# Patient Record
Sex: Female | Born: 1965 | Race: Black or African American | Hispanic: Yes | Marital: Married | Smoking: Never smoker
Health system: Southern US, Community
[De-identification: ages and names within clinical notes are randomized; demographics above are authoritative.]

## PROBLEM LIST (undated history)

## (undated) DIAGNOSIS — I509 Heart failure, unspecified: Secondary | ICD-10-CM

## (undated) DIAGNOSIS — Z800 Family history of malignant neoplasm of digestive organs: Secondary | ICD-10-CM

## (undated) DIAGNOSIS — E669 Obesity, unspecified: Secondary | ICD-10-CM

## (undated) DIAGNOSIS — M545 Low back pain, unspecified: Secondary | ICD-10-CM

## (undated) DIAGNOSIS — M7989 Other specified soft tissue disorders: Secondary | ICD-10-CM

## (undated) DIAGNOSIS — R112 Nausea with vomiting, unspecified: Secondary | ICD-10-CM

## (undated) DIAGNOSIS — K5900 Constipation, unspecified: Secondary | ICD-10-CM

## (undated) DIAGNOSIS — M2550 Pain in unspecified joint: Secondary | ICD-10-CM

## (undated) DIAGNOSIS — I10 Essential (primary) hypertension: Secondary | ICD-10-CM

## (undated) DIAGNOSIS — E559 Vitamin D deficiency, unspecified: Secondary | ICD-10-CM

## (undated) DIAGNOSIS — Z9889 Other specified postprocedural states: Secondary | ICD-10-CM

## (undated) DIAGNOSIS — I429 Cardiomyopathy, unspecified: Secondary | ICD-10-CM

## (undated) DIAGNOSIS — G8929 Other chronic pain: Secondary | ICD-10-CM

## (undated) DIAGNOSIS — IMO0002 Reserved for concepts with insufficient information to code with codable children: Secondary | ICD-10-CM

## (undated) DIAGNOSIS — Z803 Family history of malignant neoplasm of breast: Secondary | ICD-10-CM

## (undated) DIAGNOSIS — Z8489 Family history of other specified conditions: Secondary | ICD-10-CM

## (undated) DIAGNOSIS — D649 Anemia, unspecified: Secondary | ICD-10-CM

## (undated) HISTORY — DX: Pain in unspecified joint: M25.50

## (undated) HISTORY — DX: Other chronic pain: G89.29

## (undated) HISTORY — DX: Family history of malignant neoplasm of breast: Z80.3

## (undated) HISTORY — DX: Low back pain, unspecified: M54.50

## (undated) HISTORY — PX: COLONOSCOPY: SHX174

## (undated) HISTORY — PX: LAPAROSCOPIC OVARIAN CYSTECTOMY: SUR786

## (undated) HISTORY — PX: BREAST LUMPECTOMY: SHX2

## (undated) HISTORY — DX: Other specified soft tissue disorders: M79.89

## (undated) HISTORY — DX: Essential (primary) hypertension: I10

## (undated) HISTORY — DX: Low back pain: M54.5

## (undated) HISTORY — PX: ABDOMINAL HYSTERECTOMY: SHX81

## (undated) HISTORY — DX: Reserved for concepts with insufficient information to code with codable children: IMO0002

## (undated) HISTORY — DX: Vitamin D deficiency, unspecified: E55.9

## (undated) HISTORY — PX: NASAL SINUS SURGERY: SHX719

## (undated) HISTORY — DX: Family history of malignant neoplasm of digestive organs: Z80.0

## (undated) HISTORY — PX: WISDOM TOOTH EXTRACTION: SHX21

---

## 2006-04-05 ENCOUNTER — Emergency Department (HOSPITAL_COMMUNITY): Admission: EM | Admit: 2006-04-05 | Discharge: 2006-04-05 | Payer: Self-pay | Admitting: Emergency Medicine

## 2006-08-08 ENCOUNTER — Emergency Department (HOSPITAL_COMMUNITY): Admission: EM | Admit: 2006-08-08 | Discharge: 2006-08-08 | Payer: Self-pay | Admitting: *Deleted

## 2009-04-20 DIAGNOSIS — M7989 Other specified soft tissue disorders: Secondary | ICD-10-CM | POA: Insufficient documentation

## 2009-06-06 ENCOUNTER — Emergency Department (HOSPITAL_COMMUNITY): Admission: EM | Admit: 2009-06-06 | Discharge: 2009-06-06 | Payer: Self-pay | Admitting: Emergency Medicine

## 2009-06-11 DIAGNOSIS — M5412 Radiculopathy, cervical region: Secondary | ICD-10-CM | POA: Insufficient documentation

## 2009-06-11 DIAGNOSIS — M542 Cervicalgia: Secondary | ICD-10-CM | POA: Insufficient documentation

## 2009-06-11 DIAGNOSIS — IMO0002 Reserved for concepts with insufficient information to code with codable children: Secondary | ICD-10-CM | POA: Insufficient documentation

## 2009-07-16 ENCOUNTER — Encounter: Admission: RE | Admit: 2009-07-16 | Discharge: 2009-07-16 | Payer: Self-pay | Admitting: Emergency Medicine

## 2009-07-26 DIAGNOSIS — G4700 Insomnia, unspecified: Secondary | ICD-10-CM | POA: Insufficient documentation

## 2009-10-09 ENCOUNTER — Encounter: Admission: RE | Admit: 2009-10-09 | Discharge: 2009-10-09 | Payer: Self-pay | Admitting: Neurosurgery

## 2010-06-06 ENCOUNTER — Encounter
Admission: RE | Admit: 2010-06-06 | Discharge: 2010-06-06 | Payer: Self-pay | Source: Home / Self Care | Attending: Orthopedic Surgery | Admitting: Orthopedic Surgery

## 2010-12-10 ENCOUNTER — Encounter: Payer: Self-pay | Admitting: Vascular Surgery

## 2010-12-17 ENCOUNTER — Encounter (INDEPENDENT_AMBULATORY_CARE_PROVIDER_SITE_OTHER): Payer: Worker's Compensation | Admitting: Vascular Surgery

## 2010-12-17 ENCOUNTER — Other Ambulatory Visit (HOSPITAL_COMMUNITY): Payer: Self-pay | Admitting: Orthopedic Surgery

## 2010-12-17 ENCOUNTER — Encounter (HOSPITAL_COMMUNITY)
Admission: RE | Admit: 2010-12-17 | Discharge: 2010-12-17 | Disposition: A | Discharge status: Home / Self Care | Source: Ambulatory Visit | Attending: Orthopedic Surgery | Admitting: Orthopedic Surgery

## 2010-12-17 DIAGNOSIS — IMO0002 Reserved for concepts with insufficient information to code with codable children: Secondary | ICD-10-CM

## 2010-12-17 DIAGNOSIS — M545 Low back pain, unspecified: Secondary | ICD-10-CM

## 2010-12-17 LAB — CBC
Hemoglobin: 10.5 g/dL — ABNORMAL LOW (ref 12.0–15.0)
MCH: 27.6 pg (ref 26.0–34.0)
MCHC: 32.3 g/dL (ref 30.0–36.0)
Platelets: 275 10*3/uL (ref 150–400)
RDW: 13.5 % (ref 11.5–15.5)

## 2010-12-17 LAB — COMPREHENSIVE METABOLIC PANEL
ALT: 12 U/L (ref 0–35)
Alkaline Phosphatase: 51 U/L (ref 39–117)
CO2: 29 mEq/L (ref 19–32)
Calcium: 9 mg/dL (ref 8.4–10.5)
GFR calc Af Amer: >60 mL/min (ref >60)
GFR calc non Af Amer: >60 mL/min (ref >60)
Glucose, Bld: 87 mg/dL (ref 70–99)
Sodium: 139 mEq/L (ref 135–145)

## 2010-12-17 LAB — URINALYSIS, ROUTINE W REFLEX MICROSCOPIC
Bilirubin Urine: NEGATIVE
Hgb urine dipstick: NEGATIVE
Ketones, ur: NEGATIVE mg/dL
Protein, ur: NEGATIVE mg/dL
Urobilinogen, UA: 0.2 mg/dL (ref 0.0–1.0)

## 2010-12-17 LAB — DIFFERENTIAL
Basophils Relative: 0 % (ref 0–1)
Eosinophils Absolute: 0.1 10*3/uL (ref 0.0–0.7)
Eosinophils Relative: 1 % (ref 0–5)
Monocytes Absolute: 0.6 10*3/uL (ref 0.1–1.0)
Monocytes Relative: 8 % (ref 3–12)

## 2010-12-17 LAB — APTT: aPTT: 28 seconds (ref 24–37)

## 2010-12-17 NOTE — Consult Note (Signed)
NEW PATIENT CONSULTATION  Molly Norton, Molly Norton DOB:  May 03, 1966                                       12/17/2010 GNFAO#:[id number]  Patient presents today for evaluation and preoperative planning for anterior lumbar interbody fusion exposure.  She is a very pleasant 45- year-old female with progressively severe degenerative disk disease at L4-5 and L5-S1.  She has been evaluated and treated by Dr. Estill Bamberg and is recommended anterior approach.  She does have severe lower back pain and also some pain extending into her right buttock and her lateral knee.  She has not had a prior back surgery.  PAST MEDICAL HISTORY:  Completely benign with no major medical difficulties.  PAST SURGICAL HISTORY:  She has had a cesarean section in 1990, ovarian cyst rupture with laparoscopic surgery to follow that, and a possible hysterectomy for menorrhagia in 2009.  These were all through a midline incision.  She did have some blistering from Steri-Strips following 1 of these procedures.  FAMILY HISTORY:  Negative for premature atherosclerotic disease.  SOCIAL HISTORY:  She is single.  She has 1 child.  She works as a Doctor, hospital.  She does not smoke or drink alcohol.  REVIEW OF SYSTEMS:  Height is 5 feet 8 inches tall.  She is 203 pounds. She does report a 40-pound weight gain since her diminished activity due to her back pain.  Review of systems is otherwise completely negative aside from some lower extremity swelling with prolonged standing.  PHYSICAL EXAMINATION:  A well-developed and well-nourished black female appearing stated age in no acute stress.  Blood pressure is 136/91, pulse 99, respirations 18, oxygen saturation is 99% on room air.  HEENT: Normal.  Her radial and dorsalis pedis pulses are 2+ bilaterally. Abdomen:  Soft, nontender.  She does have a well-healed midline incision.  No evidence of hernia.  Musculoskeletal shows no major deformity or cyanosis.   Neurologic:  No focal weakness or paresthesias. Skin without ulcers or rashes.  I discussed at length my role in exposure for ALIF to include mobilization of the peritoneal sac and intraperitoneal contents, mobilization of the left ureter and the iliac arteries and veins.  I did discuss the potential injury of these.  She does report that she is given 2 units of packed cells for need if she should have transfusion requirements.  I did explain that we would use Cell Saver during the time of surgery.  I explained the potential risk of this mobilization. She understands and wishes to proceed with surgery as scheduled on 07/12 at [facility].    Larina Earthly, M.D. Electronically Signed  TFE/MEDQ  D:  12/17/2010  T:  12/17/2010  Job:  [id number]  cc:   Estill Bamberg, MD

## 2010-12-19 ENCOUNTER — Inpatient Hospital Stay (HOSPITAL_COMMUNITY)

## 2010-12-19 ENCOUNTER — Inpatient Hospital Stay (HOSPITAL_COMMUNITY)
Admission: RE | Admit: 2010-12-19 | Discharge: 2011-01-02 | DRG: 455 | Disposition: A | Discharge status: Rehabilitation Facility | Source: Ambulatory Visit | Attending: Orthopedic Surgery | Admitting: Orthopedic Surgery

## 2010-12-19 DIAGNOSIS — M5137 Other intervertebral disc degeneration, lumbosacral region: Secondary | ICD-10-CM

## 2010-12-19 DIAGNOSIS — D649 Anemia, unspecified: Secondary | ICD-10-CM | POA: Diagnosis present

## 2010-12-19 DIAGNOSIS — M51379 Other intervertebral disc degeneration, lumbosacral region without mention of lumbar back pain or lower extremity pain: Secondary | ICD-10-CM

## 2010-12-19 DIAGNOSIS — M5126 Other intervertebral disc displacement, lumbar region: Secondary | ICD-10-CM | POA: Diagnosis present

## 2010-12-19 DIAGNOSIS — N393 Stress incontinence (female) (male): Secondary | ICD-10-CM | POA: Diagnosis not present

## 2010-12-19 DIAGNOSIS — Z01812 Encounter for preprocedural laboratory examination: Secondary | ICD-10-CM

## 2010-12-19 DIAGNOSIS — Z01810 Encounter for preprocedural cardiovascular examination: Secondary | ICD-10-CM

## 2010-12-19 HISTORY — PX: ANTERIOR LUMBAR FUSION: SHX1170

## 2010-12-21 NOTE — Op Note (Signed)
Molly Norton, Molly Norton           ACCOUNT NO.:  [id number]  MEDICAL RECORD NO.:  [id number]  LOCATION:  5032                         FACILITY:  [facility]  PHYSICIAN:  Estill Bamberg, MD      DATE OF BIRTH:  04/10/1966  DATE OF PROCEDURE:  12/19/2010 DATE OF DISCHARGE:                              OPERATIVE REPORT   PREOPERATIVE DIAGNOSES: 1. L4-5 and L5-S1 degenerative disk disease. 2. Bilateral L5-S1 neural foraminal stenosis.  POSTOPERATIVE DIAGNOSES: 1. L4-5 and L5-S1 degenerative disk disease. 2. Bilateral L5-S1 neural foraminal stenosis.  PROCEDURE: 1. Anterior lumbar interbody fusion L4-5 and L5-S1. 2. Posterior L5-S1 laminectomy, partial facetectomy, and foraminotomy     bilaterally. 3. Posterior spinal fusion, L4-5 and L5-S1. 4. Placement of posterior segmental instrumentation L4, L5, and S1. 5. Placement of intervertebral device L4-5 and L5-S1. 6. Use of local autograft. 7. Intraoperative bone marrow aspiration from the patient's left iliac     crest. 8. Intraoperative use of fluoroscopy.  SURGEON:  Estill Bamberg, MD  ASSISTANT:  Janace Litten, OPA for the posterior aspect of the procedure.  ESTIMATED BLOOD LOSS:  200 mL.  COMPLICATIONS:  None.  DISPOSITION:  Stable.  INDICATIONS FOR PROCEDURE:  Briefly, Molly Norton is a very pleasant 45-year-old female who I initially saw on May 07, 2010, with right leg in addition to low back pain.  This was following a work-related injury that occurred on June 05, 2009, when she slid on black ice and fell on her buttock and back.  We did go forward with extensive nonoperative measures and the patient did go onto have ongoing pain in her back in addition to her right greater than her left leg.  Of note, she did fail physical therapy in addition, epidural injections, anti-inflammatories, and pain medications.  I did review an MRI which was consistent with degenerative disk disease at the L5-S1 level and a  diskogram was also obtained which did reveal concordant pain at the L4-5 and L5-S1 levels. Given her failure of nonoperative measures, we did make a decision to go forward with anterior lumbar interbody fusion with instrumented posterior lumbar fusion in addition to decompression at the L5-S1 level. The patient fully understood the risks and limitations of the procedure as outlined in my preoperative note.  OPERATIVE DETAILS:  On December 19, 2010, the patient was brought to surgery and general endotracheal anesthesia was administered.  The patient was placed supine on hospital bed.  The abdomen was prepped and draped in the usual sterile fashion.  Time-out procedure was performed.  SCDs were placed and antibiotics were given.  A left paramedian incision was made by Dr. Tawanna Cooler Early and anterior approach was performed by Dr. Gretta Began. Ultimately, the L5-S1 interspace was identified.  I did use bipolar electrocautery to outline the anterior aspect of the intervertebral space.  I did use a 10-blade knife to perform an annulotomy anteriorly. I then went forward using a series of curettes and pituitaries to perform a very thorough diskectomy to the posterior annulus and to the annulus on the left and right sides.  I was very happy with the diskectomy.  The endplate was then prepared using a series of curettes in addition to  an angled awl.  I then placed a 14-mm 5-degree medium Couger trial and I did feel this did result in an excellent press fit. I did use live lateral fluoroscopy in order to confirm the appropriate sizing and the location of the graft.  At this point, I obtained a total of 12 mL of bone marrow aspirate from the patient's left iliac crest using a separate incision.  This was mixed with a total of 15 mL of Vitoss BA.  Approximately 5 mL of the Vitoss BA was packed into the 14- mm, 5-degree medium PEEK intervertebral cage.  This was tamped into position in the usual fashion and I  did note an excellent press fit. The L4-5 interspace was then approached and a diskectomy was performed as previously described.  The same size graft was tamped into position using the Vitoss BA bone marrow aspirate mixture.  I was very happy with the final press fit of each intervertebral graft and I was very happy with the appearance of the lateral and AP fluoroscopic views.  The wound was then copiously irrigated.  The fascia was closed using #1 Vicryl. The subcutaneous layer was closed using 2-0 Vicryl and the skin was closed using 3-0 Monocryl.  The patient was then turned prone in rotatory fashion.  The back was then prepped and draped in usual sterile fashion.  I then again brought in lateral fluoroscopy and I did place two 18-gauge needles over the midline in order to help optimize my location of my incision.  An incision was then made from the spinous process of L5 to the spinous process of S1.  The L5-S1 interspace was readily identified and a standard decompression was performed.  I did remove the ligamentum flavum in its entirety and did perform a thorough partial facetectomy and foraminotomy and I was easily able pass a Lorette Ang out the neural foramen on both the right and left sides at the termination of this portion of the procedure.  All epidural bleeding was meticulously controlled using FloSeal.  This wound was then copiously irrigated and closed with #1 Vicryl, followed by 2-0 Vicryl and 3-0 Monocryl.  I then marked out the lateral border of the pedicles of L4, L5, and S1 and paramedian incisions were made approximately 2.5 cm lateral to the lateral border of the pedicles.  The fascia was incised in line with the incision.  I then used Jamshidi needles and I placed guidewires down the L4, L5, and S1 pedicles on both the right and left sides.  The pedicles were then cannulated using a 6.5-mm tap.  I did use motor-evoked EMG in order to confirm that there was no  cortical violation or undue compression on any of the traversing or exiting nerves, when each of the taps were placed.  There is no tap that tested less than 15 mA.  I then proceeded with placing 7 x 45 mm screws at L4 and L5 and 7.5 x 14 mm screws at S1.  A 65-mm rod was selected and this was passed into the screw heads subfascially in a minimally invasive fashion.  Caps were placed along the L5 screws and final locking procedure was performed.  I then applied compression across the L4-5 and L5-S1 disks and a final locking maneuver was placed as caps were placed into the L4 and S1 screws.  This was done under AP and lateral fluoroscopy.  I was very happy with the final appearance of the radiographs.  At this point, I  turned my attention towards the fusion. I did use a series of curettes in order to decorticate the transverse process of L4, L5 and the sacral ala of S1 on the right side.  Once this was done, the remainder of the Vitoss BA was packed into the right posterolateral gutter.  Again, I was very pleased with the final construct.  The fascia was then closed using #1 Vicryl.  The subcutaneous layer was closed using 2-0 Vicryl and the skin was closed using 3-0 Monocryl.  Dermabond was then applied to the posterior incisions.  The patient was then rolled supine and transferred to recovery in stable condition.  Of note, autograft that was obtained from the decompression aspect of the procedure was also was placed into the right posterolateral gutter along with the Vitoss BA.  All instrument counts were correct at the termination of the procedure.  Of note, Janace Litten was my assistant throughout the posterior aspect of the procedure including the decompression, placement of the hardware, and the posterior fusion.     Estill Bamberg, MD     MD/MEDQ  D:  12/19/2010  T:  12/20/2010  Job:  [id number]  Electronically Signed by Estill Bamberg  on 12/21/2010 04:16:15 PM

## 2010-12-23 DIAGNOSIS — M47817 Spondylosis without myelopathy or radiculopathy, lumbosacral region: Secondary | ICD-10-CM

## 2010-12-23 DIAGNOSIS — IMO0002 Reserved for concepts with insufficient information to code with codable children: Secondary | ICD-10-CM

## 2010-12-24 LAB — URINALYSIS, MICROSCOPIC ONLY
Bilirubin Urine: NEGATIVE
Glucose, UA: NEGATIVE mg/dL
Ketones, ur: NEGATIVE mg/dL
Leukocytes, UA: NEGATIVE
Protein, ur: NEGATIVE mg/dL

## 2010-12-25 LAB — URINE CULTURE
Colony Count: NO GROWTH
Culture: NO GROWTH

## 2010-12-26 NOTE — Op Note (Signed)
  NAMEEARLENA, Molly Norton           ACCOUNT NO.:  [id number]  MEDICAL RECORD NO.:  [id number]  LOCATION:  5032                         FACILITY:  [facility]  PHYSICIAN:  Larina Earthly, M.D.    DATE OF BIRTH:  1965-09-27  DATE OF PROCEDURE:  12/19/2010 DATE OF DISCHARGE:                              OPERATIVE REPORT   PREOPERATIVE DIAGNOSIS:  Severe degenerative disk disease.  POSTOPERATIVE DIAGNOSIS:  Severe degenerative disk disease.  PROCEDURE:  Anterior exposure for lumbar interbody fusion L4-5 and L5- S1, which will be dictated as separate note by Dr. Estill Bamberg.  SURGEONS FOR THE EXPOSURE:  Larina Earthly, MD; Estill Bamberg, MD  ANESTHESIA:  General endotracheal.  COMPLICATIONS:  None.  INDICATIONS FOR PROCEDURE:  The patient is a 45 year old black female with progressively severe degenerative disk disease and pain.  She was seen preoperatively by Dr. Estill Bamberg and underwent the evaluation. It was felt that the best treatment option would be L4-5 and L5-S1 anterior approach for interbody fusion.  I saw the patient preoperatively in the office, discussed my role for exposure including potential injury to the intra-abdominal contents, ureter, and iliac vessels.  She has had multiple prior midline low midline incisions, we felt that she was acceptable candidate for anterior exposure.  PROCEDURE IN DETAIL:  The patient was taken to the operating room, placed in supine position where the abdomen was prepped and draped in usual sterile fashion.  Lateral C-arm ejection projection revealed the level of L4-5 and L5-S1 disk.  A paramedian incision was made over this level and carried down through the subcutaneous fat to the level of anterior rectus sheath.  The anterior rectus sheath was opened in line with the skin incision.  Rectus muscles were mobilized and the retroperitoneum was entered bluntly below the level of the semilunar line on the lateral aspect of the left lower  quadrant.  Blunt dissection was used to mobilize the peritoneal sac.  The peritoneum was not entered.  The posterior rectus sheath was opened laterally to give continued mobilization of intraperitoneal contents.  The ureter was identified and protected and the L5-S1 disk was identified between the level of the iliac vessels.  The patient's middle sacral vein was ligated, occluded with Ligaclips, and divided for better mobilization. Next, the left iliac vessels were mobilized towards the right.  The iliolumbar vein was identified and was ligated with 2-0 silk ties and divided.  This gave good mobilization for exposure of the L4-5 disk. The brow retractor was brought onto the field and the reverse 150 blades were positioned to the right and left of the L5-S1 disk.  Dr. Yevette Edwards then did the interbody fusion that would be dictated in a separate note.  I then re-scrubbed and repositioned the brow retractor to the L4-5 disk space and again the fusion will be dictated as a separate note by Dr. Estill Bamberg.     Larina Earthly, M.D.TFE/MEDQ  D:  12/20/2010  T:  12/21/2010  Job:  [id number]  Electronically Signed by Melysa Schroyer M.D. on 12/26/2010 07:47:50 AM

## 2010-12-27 LAB — URINALYSIS, ROUTINE W REFLEX MICROSCOPIC
Bilirubin Urine: NEGATIVE
Ketones, ur: NEGATIVE mg/dL
Nitrite: POSITIVE — AB
Protein, ur: NEGATIVE mg/dL
Specific Gravity, Urine: 1.013 (ref 1.005–1.030)
Urobilinogen, UA: 1 mg/dL (ref 0.0–1.0)

## 2010-12-27 LAB — URINE MICROSCOPIC-ADD ON

## 2010-12-28 NOTE — Consult Note (Signed)
Molly Norton, Molly Norton NO.:  [id number]  MEDICAL RECORD NO.:  [id number]  LOCATION:  5032                         FACILITY:  [facility]  PHYSICIAN:  Valetta Fuller, MD    DATE OF BIRTH:  1965-10-13  DATE OF CONSULTATION:  12/24/2010 DATE OF DISCHARGE:                                CONSULTATION   REASON FOR CONSULTATION:  Urinary incontinence, 5-day status post anterior lumbar interbody fusion L4-5, L5-S1.  HISTORY OF PRESENT ILLNESS:  This is a 45 year old female who is status post anterior interbody fusion 5 days ago after failing conservative measures for a work related injury and a fall on ice in 2011.  Her surgery was unremarkable.  The patient states prior to her surgery she had no history of urinary incontinence.  She states that she would feel appropriate urge to urinate and felt completely like she was completely emptying her bladder.  She states she drinks large volumes of water daily, therefore, voids approximately 15-20 times during the day and possibly 2-3 times at night.  She does not complain of any urinary urgency, hesitancy, dysuria, urgency or hematuria.  She was able to tolerate her indwelling Foley catheter until approximately 3 days ago.  She was able to void, although unable to hold her urine once her urge was strong after removal of catheter.  She states she was slow to progress to the bedside commode.  Over the past 24 hours, her urge to urinate has become less frequent and she has become completely incontinent of urine.  She is unable to feel that her bladder is full or empty.  She denies any complaints of nausea, vomiting, fever, chills, diarrhea, constipation, chest pain or shortness of breath.  She does complain of low back pain and bilateral lower extremity weakness secondary to her surgical procedure.  PAST MEDICAL HISTORY:  Insignificant.  PAST SURGICAL HISTORY: 1. Status post anterior lumbar interbody fusion L4-5, L5-S1 2.  Abdominal hysterectomy in 2010.  ALLERGIES: 1. LATEX. 2. ADHESIVE. 3. RELAFEN. 4. LISINOPRIL.  MEDICATIONS: 1. Flexeril. 2. Colace. 3. Robaxin. 4. Oxybutynin. 5. Oxycodone. 6. Ultram. 7. Vitamin E. 8. Vitamin B. 9. Acetaminophen p.r.n. 10.Hydroxyzine p.r.n. 11.Valium p.r.n. 12.Ambien p.r.n.  FAMILY HISTORY:  Noncontributory.  SOCIAL HISTORY:  She lives in [place] with her husband.  She has a 53- year-old daughter.  She denies any alcohol or tobacco use.  REVIEW OF SYSTEMS:  As stated per HPI.  PHYSICAL EXAMINATION:  VITAL SIGNS:  Temperature 98.6, pulse 114, respirations 18, blood pressure 139/80. CONSTITUTIONAL:  She is a well-developed obese black female in no acute distress, smiling and responding appropriately to questions. HEENT:  Normocephalic, atraumatic.  Oropharynx clear. ABDOMEN:  Soft, round, nontender.  Well-healed abdominal incision. GU:  Meatus is without lesion or mass. GYN:  No cystocele or bladder prolapse.  Labia without lesion or mass. NEURO:  Remote and recent memory is intact.  LABORATORY DATA:  From December 17, 2010, sodium 139, potassium 4.6, chloride 103, CO2 29, BUN 7, creatinine 0.56, glucose 87 and hemoglobin is 10.5.  IMPRESSION/PLAN:  New onset of urinary incontinence.  We will check an urinalysis, culture and sensitivity.  We will also check bladder emptying for bladder overflow  incontinence versus bladder spasm. Further recommendations will be pending results of the above.  Dr. Isabel Caprice will see this young lady later this afternoon.     Delia Chimes, NP   ______________________________ Valetta Fuller, MD    MA/MEDQ  D:  12/24/2010  T:  12/25/2010  Job:  [id number]  Electronically Signed by Delia Chimes NP on 12/25/2010 02:27:42 PM Electronically Signed by Barron Alvine M.D. on 12/28/2010 04:33:33 PM

## 2010-12-29 LAB — URINE CULTURE
Colony Count: >=100000
Culture  Setup Time: 203802280020

## 2010-12-30 LAB — URINALYSIS, ROUTINE W REFLEX MICROSCOPIC
Bilirubin Urine: NEGATIVE
Glucose, UA: NEGATIVE mg/dL
Hgb urine dipstick: NEGATIVE
Ketones, ur: NEGATIVE mg/dL
Protein, ur: NEGATIVE mg/dL
Urobilinogen, UA: 0.2 mg/dL (ref 0.0–1.0)

## 2010-12-31 LAB — URINE CULTURE
Colony Count: NO GROWTH
Culture: NO GROWTH

## 2011-01-02 ENCOUNTER — Inpatient Hospital Stay (HOSPITAL_COMMUNITY)
Admission: RE | Admit: 2011-01-02 | Discharge: 2011-01-20 | DRG: 945 | Disposition: A | Discharge status: Home Health | Source: Other Acute Inpatient Hospital | Attending: Physical Medicine & Rehabilitation | Admitting: Physical Medicine & Rehabilitation

## 2011-01-02 DIAGNOSIS — M51379 Other intervertebral disc degeneration, lumbosacral region without mention of lumbar back pain or lower extremity pain: Secondary | ICD-10-CM | POA: Diagnosis present

## 2011-01-02 DIAGNOSIS — N319 Neuromuscular dysfunction of bladder, unspecified: Secondary | ICD-10-CM | POA: Diagnosis not present

## 2011-01-02 DIAGNOSIS — A498 Other bacterial infections of unspecified site: Secondary | ICD-10-CM | POA: Diagnosis present

## 2011-01-02 DIAGNOSIS — K5900 Constipation, unspecified: Secondary | ICD-10-CM | POA: Diagnosis present

## 2011-01-02 DIAGNOSIS — D649 Anemia, unspecified: Secondary | ICD-10-CM | POA: Diagnosis present

## 2011-01-02 DIAGNOSIS — R32 Unspecified urinary incontinence: Secondary | ICD-10-CM | POA: Diagnosis present

## 2011-01-02 DIAGNOSIS — IMO0002 Reserved for concepts with insufficient information to code with codable children: Secondary | ICD-10-CM

## 2011-01-02 DIAGNOSIS — M48061 Spinal stenosis, lumbar region without neurogenic claudication: Secondary | ICD-10-CM

## 2011-01-02 DIAGNOSIS — N390 Urinary tract infection, site not specified: Secondary | ICD-10-CM | POA: Diagnosis present

## 2011-01-02 DIAGNOSIS — R5381 Other malaise: Secondary | ICD-10-CM

## 2011-01-02 DIAGNOSIS — Z5189 Encounter for other specified aftercare: Principal | ICD-10-CM

## 2011-01-02 DIAGNOSIS — M5126 Other intervertebral disc displacement, lumbar region: Secondary | ICD-10-CM | POA: Diagnosis present

## 2011-01-02 DIAGNOSIS — F411 Generalized anxiety disorder: Secondary | ICD-10-CM | POA: Diagnosis present

## 2011-01-02 DIAGNOSIS — Z981 Arthrodesis status: Secondary | ICD-10-CM

## 2011-01-02 DIAGNOSIS — R259 Unspecified abnormal involuntary movements: Secondary | ICD-10-CM | POA: Diagnosis present

## 2011-01-02 DIAGNOSIS — T8189XA Other complications of procedures, not elsewhere classified, initial encounter: Secondary | ICD-10-CM | POA: Diagnosis not present

## 2011-01-02 DIAGNOSIS — M5137 Other intervertebral disc degeneration, lumbosacral region: Secondary | ICD-10-CM | POA: Diagnosis present

## 2011-01-03 DIAGNOSIS — R5381 Other malaise: Secondary | ICD-10-CM

## 2011-01-03 DIAGNOSIS — M48061 Spinal stenosis, lumbar region without neurogenic claudication: Secondary | ICD-10-CM

## 2011-01-03 DIAGNOSIS — IMO0002 Reserved for concepts with insufficient information to code with codable children: Secondary | ICD-10-CM

## 2011-01-03 LAB — DIFFERENTIAL
Basophils Absolute: 0 10*3/uL (ref 0.0–0.1)
Basophils Relative: 1 % (ref 0–1)
Lymphocytes Relative: 31 % (ref 12–46)
Monocytes Absolute: 0.6 10*3/uL (ref 0.1–1.0)
Neutro Abs: 4.3 10*3/uL (ref 1.7–7.7)

## 2011-01-03 LAB — CBC
HCT: 23.8 % — ABNORMAL LOW (ref 36.0–46.0)
Hemoglobin: 7.5 g/dL — ABNORMAL LOW (ref 12.0–15.0)
MCHC: 31.5 g/dL (ref 30.0–36.0)
RDW: 14 % (ref 11.5–15.5)
WBC: 7.7 10*3/uL (ref 4.0–10.5)

## 2011-01-03 LAB — COMPREHENSIVE METABOLIC PANEL
ALT: 19 U/L (ref 0–35)
Albumin: 2.9 g/dL — ABNORMAL LOW (ref 3.5–5.2)
Alkaline Phosphatase: 76 U/L (ref 39–117)
Chloride: 103 mEq/L (ref 96–112)
Glucose, Bld: 86 mg/dL (ref 70–99)
Potassium: 4.1 mEq/L (ref 3.5–5.1)
Sodium: 139 mEq/L (ref 135–145)
Total Bilirubin: 0.1 mg/dL — ABNORMAL LOW (ref 0.3–1.2)
Total Protein: 6.8 g/dL (ref 6.0–8.3)

## 2011-01-03 NOTE — H&P (Signed)
NAMELYNSEY, ANGE NO.:  [id number]  MEDICAL RECORD NO.:  [id number]  LOCATION:  4143                         FACILITY:  [facility]  PHYSICIAN:  Erick Colace, M.D.DATE OF BIRTH:  30-Oct-1965  DATE OF ADMISSION:  01/02/2011 DATE OF DISCHARGE:                             HISTORY & PHYSICAL   REASON FOR ADMISSION:  Radiculopathy, lumbar stenosis status post L4-L5, L5-S1 360-degree anterior posterior fusion.  A 45 year old female with history of chronic low back pain.  She was injured at work in December 2010.  She underwent conservative care for over a year, but then eventually underwent an L4-L5, L5-S1 360-degree fusion by Dr. Yevette Edwards on December 19, 2010.  Postoperatively, she had issues with pain and anxiety.  She had difficulty mobilizing lower extremities.  She has had tachycardia during her therapy as well as at rest.  She has had urinary incontinence, but no elevation in PVR.  GU recommended working on constipation issues, Ditropan added for urge incontinence, and a followup was recommended if no improvement as an outpatient.  REVIEW OF SYSTEMS:  Positive for urinary incontinence, wounds both abdominal and back, peeling Dermabond.  NEUROLOGIC:  Positive weakness, positive numbness, right greater than left lower extremity; anxiety and heart palpitations.  PAST MEDICAL HISTORY: 1. Sinus surgery. 2. Hysterectomy. 3. Chronic anemia.  FAMILY HISTORY:  Noncontributory.  SOCIAL HISTORY:  Lives alone, one-level home.  No tobacco.  Negative EtOH.  Friends can check in as needed.  FUNCTIONAL HISTORY:  Independent prior to admission.  Home medications include MiraLax, hydrocodone, Ultram, Flexeril, Mobic, biotin, vitamin D, multivitamin, and vitamin E.  Last hemoglobin was 10.5 on December 17, 2010, last white count 7.7, platelets 275,000.  Sodium 139, potassium 4.6, BUN 7, creatinine 0.56. Last urinalysis and culture was no growth on December 30, 2010.  PHYSICAL EXAMINATION:  GENERAL:  Well-developed, well-nourished female in no acute distress.  Overweight female in no acute distress. VITAL SIGNS:  Blood pressure 106/64, pulse 101, respirations 20, temperature 98.4. HEENT:  Eyes; anicteric, noninjected.  External ENT normal. NECK:  Supple without adenopathy. RESPIRATORY:  Effort is good.  Lungs are clear. HEART:  Mild tachycardia, rate 104.  Regular rhythm. EXTREMITIES:  1+ pedal edema bilateral lower extremities. ABDOMEN:  Positive bowel sounds.  She has Dermabond over the left lower quadrant abdominal incision which is healing well.  No evidence of drainage. SKIN:  In the lumbar spine, she has 2 incisions which are healing well with Dermabond. NEUROLOGIC:  Her motor strength is 5/5 bilateral deltoid, biceps, triceps, grip in lower extremities; 3-/5 in hip flexor, knee extensor, and ankle dorsiflexor, plantar flexor.  Sensation is mildly reduced in the right foot compared to the left foot.  There is no evidence of clonus.  No evidence of increased tone. PSYCHIATRIC:  Mood and memory are intact.  Orientation x3.  Affect is flat.  POSTADMISSION PHYSICIAN EVALUATION: 1. Functional deficits secondary to lumbar radiculopathy status post     lumbar decompression and 360-degree fusion postoperative day #15. 2. The patient admitted to receive collaborative interdisciplinary     care between the physiatrist, rehab nursing staff, and therapy     team. 3. The patient's level of medical complexity  and substantial therapy     needs in context of that medical necessity cannot be provided at a     lesser intensity of care. 4. The patient experienced substantial functional loss from her     baseline upon functional assessment at the time of preadmission     screening.  The patient was at a +2 total assist level for     transfers.  Gait was not tested.  She was total assist lower body     dressing and bathing, min assist upper body ADLs.   Currently, the     patient is min-to-mod assist bed mobility, min guard transfers, min     assist 50 feet rolling walker, min assist upper body dressing, mod     to max lower body dressing, min assist grooming, min to max toilet     transfers.  Judging by the patient's diagnosis, physical exam, and     functional history, the patient had displayed the ability to make     functional progress which will result in measurable gains while in     inpatient rehab.  These gains will be of substantial and practical     use upon discharge to home in facilitating mobility and self-care     independence.  Interim change in medical status since preadmission     screening are detailed in the history of present illness. 5. Physiatrist will provide 24-hour management of medical needs as     well as oversight of the therapy plan/treatment and provide     guidance as appropriate regarding interactions of the two. 6. A 24-hour rehab nursing will assess in the management of skin     bowel, bladder, wounds and help integrate therapy concepts,     techniques, education. 7. PT will assess and treat for pre-gait training, gait training,     endurance, safety, maintaining back precautions, ring mobility.     Goals are for a modified independent level with all mobility. 8. OT will assess and treat for ADLs along with back precautions while     performing ADLs, safety, endurance, equipment.  Goals are for     modified independent level with upper body and lower body ADLs. 9. Case management and social work will assess and treat for     psychosocial issues and discharge planning. 10.Team conferences will be held weekly to assess the patient     progress/goals and to determine barriers to discharge. 11.The patient has demonstrated sufficient medical stability and     exercise capacity to tolerate at least 3 hours of therapy per day     at least 5 days per week. 12.Estimated length of stay is 10-14 days.  Prognosis  for further     functional improvement is good.  MEDICAL PROBLEM LESS AND PLAN: 1. Tachycardia.  Has a history of chronic anemia, may have had     superimposed acute blood loss anemia which is predisposing her.  We     will check CBC in the morning.  Also check her hydration and make     sure she does not have any dehydration. 2. Deep venous thrombosis prophylaxis with TED hose and SCDs and     increasing her mobility. 3. Urinary tract infection with Escherichia coli recultured while on     antibiotics which was negative, of course.  She is day 6-7 on     antibiotics.  We will reculture if she has symptoms or fever. 4. Urinary incontinence, urge  incontinence.  Continue Ditropan, may     need to adjust dosage. 5. Constipation.  Continue current bowel program which consists of     MiraLax.  Allow the patient to use her "Smooth Move Tea."  She does     have some chronic constipation issues even before she was using     narcotics. 6. Pain management.  Was using about 7.5 mg twice a day of hydrocodone     at home plus 1 Percocet at home.  She is requiring much more pain     medications postoperatively.  We will increase her OxyContin to 40     mg p.o. b.i.d., monitor for signs of cognitive deficits as well as     sedation. 7. Spasms.  She complains of severe spasms, although she does not have     overt spasticity.  She has had good success with baclofen and will     resume this. 8. Anxiety.  She is on Valium 5 mg q.4 h., may need to switch to SSRI     given this maybe longer-term issue.  Discussed Rehab Medicine Services with the patient, answered questions.  Motivation and mood appear to be adequate for full participation in rehab program.     Erick Colace, M.D.     AEK/MEDQ  D:  01/02/2011  T:  01/03/2011  Job:  [id number]  cc:   Estill Bamberg, MD Valetta Fuller, MD  Electronically Signed by Claudette Laws M.D. on 01/03/2011 11:22:34 AM

## 2011-01-04 LAB — TYPE AND SCREEN
ABO/RH(D): A POS
Unit division: 0

## 2011-01-05 LAB — CBC
HCT: 28.1 % — ABNORMAL LOW (ref 36.0–46.0)
MCH: 26.5 pg (ref 26.0–34.0)
MCHC: 32 g/dL (ref 30.0–36.0)
MCV: 82.6 fL (ref 78.0–100.0)
Platelets: 492 10*3/uL — ABNORMAL HIGH (ref 150–400)
RDW: 14.1 % (ref 11.5–15.5)

## 2011-01-06 DIAGNOSIS — M48061 Spinal stenosis, lumbar region without neurogenic claudication: Secondary | ICD-10-CM

## 2011-01-06 DIAGNOSIS — IMO0002 Reserved for concepts with insufficient information to code with codable children: Secondary | ICD-10-CM

## 2011-01-06 DIAGNOSIS — R5381 Other malaise: Secondary | ICD-10-CM

## 2011-01-06 LAB — CBC
MCH: 26.4 pg (ref 26.0–34.0)
MCHC: 31.8 g/dL (ref 30.0–36.0)
Platelets: 491 10*3/uL — ABNORMAL HIGH (ref 150–400)
RDW: 13.8 % (ref 11.5–15.5)

## 2011-01-07 DIAGNOSIS — R5381 Other malaise: Secondary | ICD-10-CM

## 2011-01-07 DIAGNOSIS — M48061 Spinal stenosis, lumbar region without neurogenic claudication: Secondary | ICD-10-CM

## 2011-01-07 DIAGNOSIS — IMO0002 Reserved for concepts with insufficient information to code with codable children: Secondary | ICD-10-CM

## 2011-01-08 DIAGNOSIS — F411 Generalized anxiety disorder: Secondary | ICD-10-CM

## 2011-01-09 DIAGNOSIS — M48061 Spinal stenosis, lumbar region without neurogenic claudication: Secondary | ICD-10-CM

## 2011-01-09 DIAGNOSIS — R5381 Other malaise: Secondary | ICD-10-CM

## 2011-01-09 DIAGNOSIS — IMO0002 Reserved for concepts with insufficient information to code with codable children: Secondary | ICD-10-CM

## 2011-01-10 DIAGNOSIS — M48061 Spinal stenosis, lumbar region without neurogenic claudication: Secondary | ICD-10-CM

## 2011-01-10 DIAGNOSIS — IMO0002 Reserved for concepts with insufficient information to code with codable children: Secondary | ICD-10-CM

## 2011-01-10 DIAGNOSIS — R5381 Other malaise: Secondary | ICD-10-CM

## 2011-01-10 LAB — BASIC METABOLIC PANEL
Calcium: 9.1 mg/dL (ref 8.4–10.5)
GFR calc Af Amer: >60 mL/min (ref >60)
GFR calc non Af Amer: >60 mL/min (ref >60)
Glucose, Bld: 84 mg/dL (ref 70–99)
Sodium: 139 mEq/L (ref 135–145)

## 2011-01-13 DIAGNOSIS — IMO0002 Reserved for concepts with insufficient information to code with codable children: Secondary | ICD-10-CM

## 2011-01-13 DIAGNOSIS — R5381 Other malaise: Secondary | ICD-10-CM

## 2011-01-13 DIAGNOSIS — M48061 Spinal stenosis, lumbar region without neurogenic claudication: Secondary | ICD-10-CM

## 2011-01-13 LAB — CBC
MCH: 26.4 pg (ref 26.0–34.0)
Platelets: 334 10*3/uL (ref 150–400)
RBC: 3.64 MIL/uL — ABNORMAL LOW (ref 3.87–5.11)
WBC: 4.8 10*3/uL (ref 4.0–10.5)

## 2011-01-14 DIAGNOSIS — M7989 Other specified soft tissue disorders: Secondary | ICD-10-CM

## 2011-01-14 LAB — BASIC METABOLIC PANEL
CO2: 28 mEq/L (ref 19–32)
Calcium: 9.1 mg/dL (ref 8.4–10.5)
Potassium: 3.7 mEq/L (ref 3.5–5.1)
Sodium: 141 mEq/L (ref 135–145)

## 2011-01-15 DIAGNOSIS — M48061 Spinal stenosis, lumbar region without neurogenic claudication: Secondary | ICD-10-CM

## 2011-01-15 DIAGNOSIS — IMO0002 Reserved for concepts with insufficient information to code with codable children: Secondary | ICD-10-CM

## 2011-01-15 DIAGNOSIS — R5381 Other malaise: Secondary | ICD-10-CM

## 2011-01-17 DIAGNOSIS — M48061 Spinal stenosis, lumbar region without neurogenic claudication: Secondary | ICD-10-CM

## 2011-01-17 DIAGNOSIS — IMO0002 Reserved for concepts with insufficient information to code with codable children: Secondary | ICD-10-CM

## 2011-01-17 DIAGNOSIS — R5381 Other malaise: Secondary | ICD-10-CM

## 2011-01-17 LAB — BASIC METABOLIC PANEL
Chloride: 99 mEq/L (ref 96–112)
GFR calc Af Amer: >60 mL/min (ref >60)
GFR calc non Af Amer: >60 mL/min (ref >60)
Glucose, Bld: 83 mg/dL (ref 70–99)
Potassium: 3.7 mEq/L (ref 3.5–5.1)
Sodium: 137 mEq/L (ref 135–145)

## 2011-01-17 LAB — VITAMIN D 1,25 DIHYDROXY
Vitamin D2 1, 25 (OH)2: <8 pg/mL
Vitamin D3 1, 25 (OH)2: 35 pg/mL

## 2011-01-19 ENCOUNTER — Inpatient Hospital Stay (HOSPITAL_COMMUNITY)

## 2011-02-05 NOTE — Discharge Summary (Signed)
Molly Norton, [name]           ACCOUNT NO.:  [id number]  MEDICAL RECORD NO.:  [id number]  LOCATION:  4039                         FACILITY:  [facility]  PHYSICIAN:  Erick Colace, M.D.DATE OF BIRTH:  [birth year]  DATE OF ADMISSION:  01/02/2011 DATE OF DISCHARGE:  01/20/2011                              DISCHARGE SUMMARY   DISCHARGE DIAGNOSES: 1. L4-L5, L5-S1 degenerative disc disease with L5-S1 stenosis and     radiculopathy requiring anterior interbody fusion and posterior     lumbar interbody fusion. 2. Escherichia coli urinary tract infection, treated. 3. Anxiety issues much improved. 4. Neurogenic bowel and bladder improving. 5. Peripheral edema. 6. Anemia improved past transfusion.  HISTORY OF PRESENT ILLNESS:  Molly Norton is a 45 year old female with history of chronic low back pain radiating to right lower extremity due to L4-L5 and L5-S1 DDD with bilateral L5-S1 foraminal stenosis.  The patient elected to undergo L4-S1 ALIF and posterior lumbar fusion L4-5, L5-S1 by Dr. Yevette Edwards on December 28, 2010.  Postop has had issues with pain as well as anxiety issues.  She has required extra time for mobility and noted to have difficulty mobilizing bilateral lower extremity.  She was also noted to have incontinence which is acute onset and GU was consulted for input.  A UA/UC was done and the patient was noted to have E. coli UTI and most recent culture of December 27, 2010, treated with Cipro.  The PVR checks were low and recommendations were to work on constipation which is a current problem.  Also, if symptoms do not improve, follow up on outpatient basis.  The patient's therapies are ongoing and the patient is noted to be deconditioned.  The patient was evaluated by rehab and we felt that she would benefit from a CIR program.  PAST MEDICAL HISTORY:  Significant for sinus surgery, hysterectomy and chronic anemia.  REVIEW OF SYMPTOMS:  Positive for urinary incontinence,  abdominal wounds of both left lower abdominal area as well as on back, positive numbness right greater than left lower extremity, anxiety as well as palpitations.  ALLERGIES:  To LISINOPRIL and NABUMETONE.  FAMILY HISTORY:  Noncontributory.  SOCIAL HISTORY:  The patient lives alone in 1-level home with 26 steps at entry.  Does not use any tobacco or alcohol.  Friends can check in as needed past discharge.  FUNCTIONAL HISTORY:  The patient was independent prior to admission.  FUNCTIONAL STATUS:  The patient is supervision to modified independent for bed mobility, min assist transfers, min guard assist ambulating 60 feet with rolling walker.  She is mod assist for lower body bathing, max assist for lower body dressing.  PHYSICAL EXAMINATION:  VITALS:  Blood pressure 106/64, pulse 101, respirations 20 and temperature 98.9. GENERAL:  The patient is a well-nourished, well-developed, overweight female, in no acute distress. HEENT:  Eyes anicteric, noninjected.  Oral mucosa is pink and moist. NECK:  Supple without JVD or lymphadenopathy. LUNGS:  Clear to auscultation bilaterally with effort. HEART:  Mild tachycardia, regular rhythm. EXTREMITIES:  Pedal edema 1+ bilaterally. ABDOMEN:  Soft and nontender with positive bowel sounds.  Left lower quadrant abdominal incision is healing well without any evidence of drainage. SKIN:  Lumbar spine.  The patient with 2 incisions with a healing well with the wound in place. NEUROLOGIC:  The patient is alert and oriented x3.  Affect is flat. Mood and memory are intact.  Motor strength is 5/5 bilateral deltoids, biceps, triceps and grips, lower extremity is 3- to 5 in hip flexions, knee extensions and ankle dorsiflexion, plantar flexion.  Sensation is mild decreased in right foot compared to left foot.  No evidence of clonus or increased tone.  HOSPITAL COURSE:  [name] was admitted to rehab on January 02, 2011, for inpatient  therapies to consist of PT, OT at least 3 hours 5 days a week.  Past admission physiatrist with rehab RN and therapy team have worked together to provide customized collaborative interdisciplinary care.  The patient had followup UA that was noted of December 30, 2010 that showed no growth.  She was maintained on Cipro through January 03, 2011, to complete 7 total days of antibiotic therapy. She was also started on aggressive bowel program to help with her constipation issues.  She was noted to have some issues with sedation and Valium was decreased to 5 mg p.o. q.i.d.  The patient was started on baclofen 10 mg p.o. b.i.d. to help with her spasticity.  Rehab RN has worked with the patient on bowel and bladder program.  Voiding, the patient was toileted q.4 h.  Bladder scans were checked showing volumes at 50-69 mL.  She continued to have issues with urge incontinence and Ditropan was increased to q.i.d. basis.  Labs were done past admission showing acute blood loss anemia with H and H at 7.5 and 23.8.  The patient reported that she had donated autologous blood and she was transfused with 2 units of this.  Recheck CBC of January 07, 2011, showed improvement with hemoglobin 9.6, hematocrit 30.2, white count 6.2 and platelets 491.  Lytes were checked past admission.  The patient was noted to develop increased edema in lower extremity and TEDs were used. She did require addition of low-dose diuretic to help with edema control.  Lytes were checked on January 17, 2011, and these revealed sodium 137, potassium 3.7, chloride 99, CO2 30, BUN 7, creatinine 0.6 and glucose 83.  The patient's back incision was noted to be healing well without any signs or symptoms of infection.  The patient was weaned off Valium during this stay.  Bilateral lower extremity Dopplers were done and showed no evidence of DVT.  The patient has continued to have complaints about pain.  However, she reports this is overall improved  by time of discharge.  During the patient's stay in rehab, weekly team conferences were held to monitor the patient's progress, set goals as well as discuss barriers to discharge.  At the time of admission, the patient was noted to be limited by acute low back as well as abdominal pain as well as spasms. She was noted to have bilateral lower extremity weakness with decrease in eccentric control, anxiety as well as fear of mobility resulting in very guarded posture with guarded movements and requiring increased time for all mobility.  The patient was max assist for bed mobility.  Max assist for stand pivot transfers with increased time.  She was able to ambulate 35+ 45 feet with min assist with focus on bilateral heel strike as well as right foot clearance.  She was noted to have excessive sedation initially and her OxyContin dose was adjusted to help decrease sedative symptoms.  Currently, the patient is showing  increased activity tolerance, increased base of support and improved problem solving.  She is ambulating short distance with close supervision.  She continues to have difficulty advancing right lower extremity.  Family education was done with friend regarding assistance needed for all mobility and self- care and 24 hours assistance will be provided past discharge.  OT has worked with the patient on self-care tasks.  At admission, the patient was total assist for lower body ADLs and required assistance for all mobility.  She has made significant progress and is currently at supervision level for bathing and dressing.  She is modified independent for grooming.  Supervision with all ideal task at rolling walker level. Further followup home health PT to continue via [facility].  On January 20, 2011, the patient is discharged to home.  DISCHARGE MEDICATIONS: 1. Baclofen 10 mg p.o. b.i.d. 2. Flexeril 10 mg p.o. nightly p.r.n. spasms 3. Fleet enema one per rectum daily  p.r.n. 4. Lasix 20 mg p.o. per day. 5. Robaxin 500 mg p.o. q.6 h. p.r.n. spasms. 6. Ditropan 5 mg p.o. q.i.d. 7. OxyContin tapered 30 mg p.o. q.a.m. and 20 mg p.o. nightly x10     days, then decreased to 20 mg p.o. b.i.d. till gone.  The patient     has prescriptions for OxyContin 30 mg #10 and 20 mg #40. 8. OxyIR 5 mg 1-2 p.o. q.6 h. p.r.n. moderate-to-severe pain #60     prescription. 9. Senna S 2 p.o. b.i.d. 10.Simethicone 80 mg a.c. at bedtime. 11.Ultram 100 mg p.o. q.8 h. 12.Biotin 5 mg per day. 13.Mederma cream to scars b.i.d. p.r.n. 14.MiraLax 17 g and 8 ounces per day. 15.Multivitamin 1 per day. 16.Vitamin B complex 1 per day. 17.Vitamin E 400 units p.o. per day.  ACTIVITY:  At 24 hours assistance with use of walker.  SPECIAL INSTRUCTIONS:  No strenuous activity.  No driving.  Routine back precautions.  No change in Ultram dosage and do not use hydrocodone. [facility] to provide PT and RN. Wear support stockings and keep lower extremities for edema control.  DIET:  No added salt.   FOLLOWUP:  The patient to follow up with Dr. Wynn Banker as needed. Follow up with Dr. Yevette Edwards in 2 weeks.  Follow up with Dr. Isabel Caprice in 2 weeks if GU symptoms do not improve.     Delle Reining, P.A.   ______________________________ Erick Colace, M.D.    PL/MEDQ  D:  01/20/2011  T:  01/21/2011  Job:  [id number]  cc:   Estill Bamberg, MD Valetta Fuller, MD  Electronically Signed by Osvaldo Shipper. on 01/22/2011 02:07:14 PM Electronically Signed by Claudette Laws M.D. on 02/05/2011 10:20:09 AM

## 2011-02-07 ENCOUNTER — Encounter: Payer: Federal, State, Local not specified - PPO | Attending: Physical Medicine & Rehabilitation

## 2011-02-07 ENCOUNTER — Inpatient Hospital Stay: Admitting: Physical Medicine & Rehabilitation

## 2011-02-17 ENCOUNTER — Ambulatory Visit (HOSPITAL_COMMUNITY)
Admission: RE | Admit: 2011-02-17 | Discharge: 2011-02-17 | Disposition: A | Discharge status: Home / Self Care | Source: Ambulatory Visit | Attending: Emergency Medicine | Admitting: Emergency Medicine

## 2011-02-17 DIAGNOSIS — M7989 Other specified soft tissue disorders: Secondary | ICD-10-CM

## 2011-02-17 DIAGNOSIS — M5126 Other intervertebral disc displacement, lumbar region: Secondary | ICD-10-CM | POA: Insufficient documentation

## 2011-02-17 DIAGNOSIS — M79609 Pain in unspecified limb: Secondary | ICD-10-CM

## 2011-02-27 ENCOUNTER — Encounter: Payer: Self-pay | Admitting: Vascular Surgery

## 2011-03-03 ENCOUNTER — Encounter: Payer: Self-pay | Admitting: Vascular Surgery

## 2011-03-04 ENCOUNTER — Ambulatory Visit (INDEPENDENT_AMBULATORY_CARE_PROVIDER_SITE_OTHER): Admitting: Vascular Surgery

## 2011-03-04 ENCOUNTER — Encounter: Payer: Self-pay | Admitting: Vascular Surgery

## 2011-03-04 VITALS — BP 140/95 | HR 120 | Resp 20 | Ht 67.5 in | Wt 217.0 lb

## 2011-03-04 DIAGNOSIS — IMO0002 Reserved for concepts with insufficient information to code with codable children: Secondary | ICD-10-CM

## 2011-03-04 NOTE — Progress Notes (Signed)
The patient presents today for concern regarding soreness and tenderness in her left lower abdominal incision. She is status post ALIF on 12/19/2010. He did not have any wound healing issues. She reports a pulling sensation in her incision she has a left paramedian incision. She had an old midline abdominal incision is well. She reports a pulling sensation and tenderness when reaching overhead or twisting her torso. She has chronic constipation but no change in her bowel function.  Distal exam her abdominal incision is well healed and as expected 2 months out from surgery. There is no erythema. There is his typical healing ridge under the incision. There is no evidence of hernia.  Impression and plan: Expected appearance of a 2 months postop abdominal incision. I reassured the patient there is no sign of healing difficulty hernia or infection. I explained that I would expect for her to continue to have some thickness in this area for several more months as she resolved her surgery. She was reassured this discussion will see [place] again on an as-needed basis

## 2011-03-14 ENCOUNTER — Encounter: Attending: Physical Medicine & Rehabilitation

## 2011-03-14 ENCOUNTER — Inpatient Hospital Stay: Admitting: Physical Medicine & Rehabilitation

## 2011-03-14 DIAGNOSIS — R5381 Other malaise: Secondary | ICD-10-CM | POA: Insufficient documentation

## 2011-03-14 DIAGNOSIS — M5380 Other specified dorsopathies, site unspecified: Secondary | ICD-10-CM | POA: Insufficient documentation

## 2011-03-14 DIAGNOSIS — M961 Postlaminectomy syndrome, not elsewhere classified: Secondary | ICD-10-CM

## 2011-03-14 DIAGNOSIS — M545 Low back pain, unspecified: Secondary | ICD-10-CM | POA: Insufficient documentation

## 2011-03-14 DIAGNOSIS — N3941 Urge incontinence: Secondary | ICD-10-CM | POA: Insufficient documentation

## 2011-03-14 DIAGNOSIS — K594 Anal spasm: Secondary | ICD-10-CM | POA: Insufficient documentation

## 2011-03-14 DIAGNOSIS — G894 Chronic pain syndrome: Secondary | ICD-10-CM | POA: Insufficient documentation

## 2011-03-14 DIAGNOSIS — M79609 Pain in unspecified limb: Secondary | ICD-10-CM | POA: Insufficient documentation

## 2011-03-14 DIAGNOSIS — R609 Edema, unspecified: Secondary | ICD-10-CM | POA: Insufficient documentation

## 2011-03-14 DIAGNOSIS — IMO0002 Reserved for concepts with insufficient information to code with codable children: Secondary | ICD-10-CM

## 2011-03-14 DIAGNOSIS — R5383 Other fatigue: Secondary | ICD-10-CM | POA: Insufficient documentation

## 2011-03-14 DIAGNOSIS — Z981 Arthrodesis status: Secondary | ICD-10-CM | POA: Insufficient documentation

## 2011-03-14 DIAGNOSIS — G8921 Chronic pain due to trauma: Secondary | ICD-10-CM

## 2011-03-14 DIAGNOSIS — R209 Unspecified disturbances of skin sensation: Secondary | ICD-10-CM | POA: Insufficient documentation

## 2011-03-14 DIAGNOSIS — Z79899 Other long term (current) drug therapy: Secondary | ICD-10-CM | POA: Insufficient documentation

## 2011-03-14 DIAGNOSIS — M62838 Other muscle spasm: Secondary | ICD-10-CM | POA: Insufficient documentation

## 2011-03-14 DIAGNOSIS — M7989 Other specified soft tissue disorders: Secondary | ICD-10-CM | POA: Insufficient documentation

## 2011-03-14 NOTE — Assessment & Plan Note (Signed)
Molly Norton is a 45 year old female with chronic low back pain rating to the right lower extremity.  She underwent conservative care for over a year, but eventually underwent L4-5, L5-S1, 360 degrees fusion by Dr. Yevette Edwards, December 19, 2010, in conjunction with Dr. Tawanna Cooler Early who provided anterior exposure.  Postoperatively, she had difficulties with pain and anxiety.  She had tachycardia.  She had urinary incontinence, but no elevation PVR.  GU recommend working on constipation and she used Ditropan added for urge incontinence.  She was admitted on min assist level for mobility initially max assist with lower body dressing.  She went through inpatient rehabilitation program on January 02, 2011 to January 20, 2011.  She was discharged home at a supervision level for bathing, dressing, modified independent for grooming supervision, mobility rolling walker.  Continues to use a rolling walker now but at a modified independent level.  She is followed up with Dr. Yevette Edwards.  She is also followed up with primary physician.  She had lower extremity edema treated and what she described as cellulitis which was treated with antibiotics as well as Lasix.  She did not need to be hospitalized for this.  She has had postsurgical followup with Dr. Merlyn Albert and he felt that she was healing well from her surgery.  Her past vocational history, Doctor, hospital.  Her review of systems, bladder control problems are improving, weakness, spasms are her worst enemy.  Currently, she states she mainly has it in the back bu also some rectal spasms which seem to be associated with her constipation.  She has had some lower extremity edema.  SOCIAL HISTORY:  Lives alone.  Nonsmoker, nondrinker.  CURRENT MEDICATIONS:  She was taking Senokot 2 p.o. b.i.d.  She has just run out of her methocarbamol, she has been taking tramadol 2 p.o. q.i.d. which she does not feel like it is helping.  She takes baclofen 10 mg b.i.d.,  cyclobenzaprine 10 at bedtime.  She ran out of Roxicodone short- acting yesterday.  She is continued to take OxyContin 50 mg b.i.d.  She takes Lasix 40 mg t.i.d.  She uses p.r.n. Bisacodyl suppositories as well as MiraLax every day.  ALLERGIES TO MEDICATIONS:  LISINOPRIL, NABUMETONE, ADHESIVE, and LATEX.  PHYSICAL EXAMINATION:  GENERAL:  No acute distress.  When she gets up, she becomes anxious. MUSCULOSKELETAL:  She does have some shaking of the right lower extremity at the ankle which looks like clonus, but is not accompanied by increased reflexes at any other area.  There may be some voluntary component to this.  Her straight leg raising test is normal.  She has normal strength in bilateral lower extremities.  Deep tendon reflexes are normal although difficult to test the right ankle.  Her range of motion, lower extremities, normal.  Her lumbar range of motion is limited to about 25% forward flexion, extension, lateral rotation, and bending.  Her lumbar incisions are well healed.  Gait is with a walker forward flexed posture.  No toe drag or knee instability.  IMPRESSION: 1. Lumbar post laminectomy syndrome.  She has had some chronic     constipation, worsened by her narcotic analgesics which has given     her some of the rectal spasms.  Her back spasms, I think are more     just musculoskeletal and it should be improving with time as she     heals from her surgery.  She has multiple lumbar incisions. 2. Chronic pain syndrome.  PLAN: 1. We will stop her  tramadol, I really do not think it is doing much     for her. 2. We will stop oxycodone, she has been off of this for day already,     no type of reaction off of this, does not really feel like her pain     has gotten any worse either. 3. Stop methocarbamol. 4. Continue Senokot 2 tablets b.i.d. 5. Continue baclofen, but increased to 10 mg t.i.d. may have to go to     q.i.d. 6. Continue cyclobenzaprine 10 at  bedtime. 7. Continue OxyContin 50 b.i.d. for next 3 weeks and then we will     reduce it to 40. 8. Lasix as per her primary care physician.  I will also start     Cymbalta given it's FDA approval for chronic low back pain.  Discussed with patient, agree with plan.  She has concern that Cymbalta may not be covered by workers' comp, but it should be since it is for her back pain and has FDA approval for chronic musculoskeletal pain.  We will have to start that at 30 and then workup from there.     Erick Colace, M.D. Electronically Signed    AEK/MedQ D:  03/14/2011 16:07:38  T:  03/14/2011 21:27:17  Job #:  [id number]  cc:   Estill Bamberg, MD Fax: [phone]

## 2011-04-07 ENCOUNTER — Encounter

## 2011-04-07 ENCOUNTER — Ambulatory Visit: Admitting: Physical Medicine & Rehabilitation

## 2011-04-07 DIAGNOSIS — IMO0002 Reserved for concepts with insufficient information to code with codable children: Secondary | ICD-10-CM

## 2011-04-07 DIAGNOSIS — M961 Postlaminectomy syndrome, not elsewhere classified: Secondary | ICD-10-CM

## 2011-04-07 NOTE — Assessment & Plan Note (Signed)
HISTORY:  A 45 year old female who had a work-related back injury and failed conservative care x1 year.  She underwent L4-20m L5-S1, 360 degrees fusion by Dr. Yevette Edwards with help from Dr. Gretta Began, who provided anterior exposure.  Postoperatively, she had difficulties with pain and anxiety as well as tachycardia as well as urinary incontinence. She was admitted to the inpatient rehabilitation program on January 02, 2011 and discharged on January 20, 2011.  She was discharged home at a supervision level for mobility and dressing, modified independent to grooming, supervision mobility, rolling walker.  She is now intermittently using the rolling walker.  She complains of some right middle toe numbness.  In addition, she has some intermittent hand numbness on the right side.  She has had no falls.  She has continued to have some lower extremity swelling.  PAST MEDICAL HISTORY:  She did have cellulitis postoperatively and has been getting Unna boot wraps in the past.  CURRENT PAIN MEDICATIONS: 1. OxyContin 15 mg b.i.d. about the last 2 to 2-1/2 hours prior to the     next dose she experiences some increased pain. 2. She is no longer taking short-acting oxycodone. 3. She still takes Lasix. 4. We increased her baclofen to q.i.d. which has been helpful. 5. We discontinue the cyclobenzaprine.  ALLERGIES:  LISINOPRIL, NABUMETONE, ADHESIVE, and LASIX.  PHYSICAL EXAMINATION:  GENERAL:  No acute distress.  Mood and affect are slightly anxious. MUSCULOSKELETAL:  She has no evidence of swelling or joint deformity in her toes or in her hands.  She does have lymphedema left lower extremity greater than right lower extremity mainly in the ankle and leg, less so in the foot.  Her sensation is reduced over the right 3rd toe, otherwise intact.  Intact pinprick sensation in the right upper extremity.  She has 4/5 strength in the right hip flexor, knee extensor, and ankle dorsiflexor. EXTREMITIES:  Right  upper extremity strength is normal.  IMPRESSION:  Lumbar post laminectomy syndrome.  Her back spasms having improved with increase baclofen, although she may benefit from a higher dose given that the baclofen tends to wear off overtime prior to the next dosage.  In terms of her narcotic analgesics, no signs of aberrant drug behavior, but I do think she would benefit from oxymorphone extended release, i.e. Opana ER, rather than OxyContin given the longer half-life.  She has breakthrough pain basically for the last 2 hours prior to her next dose.  PLAN: 1. We will increase her baclofen to 20 q.i.d. 2. Discontinue OxyContin and start Opana ER 20 mg b.i.d. 3. Physical therapy, I think she would benefit.  She has obtained     workers Museum/gallery conservator.  I will see her back in 1 month.     Erick Colace, M.D. Electronically Signed    AEK/MedQ D:  04/07/2011 12:51:26  T:  04/07/2011 13:34:52  Job #:  [id number]  cc:   Estill Bamberg, MD Fax: [phone]

## 2011-04-28 ENCOUNTER — Ambulatory Visit: Attending: Orthopedic Surgery

## 2011-04-28 DIAGNOSIS — M5126 Other intervertebral disc displacement, lumbar region: Secondary | ICD-10-CM | POA: Insufficient documentation

## 2011-04-28 DIAGNOSIS — R262 Difficulty in walking, not elsewhere classified: Secondary | ICD-10-CM | POA: Insufficient documentation

## 2011-04-28 DIAGNOSIS — M2560 Stiffness of unspecified joint, not elsewhere classified: Secondary | ICD-10-CM | POA: Insufficient documentation

## 2011-04-28 DIAGNOSIS — M545 Low back pain, unspecified: Secondary | ICD-10-CM | POA: Insufficient documentation

## 2011-04-28 DIAGNOSIS — IMO0001 Reserved for inherently not codable concepts without codable children: Secondary | ICD-10-CM | POA: Insufficient documentation

## 2011-04-28 DIAGNOSIS — M2550 Pain in unspecified joint: Secondary | ICD-10-CM | POA: Insufficient documentation

## 2011-05-06 ENCOUNTER — Ambulatory Visit: Admitting: Physical Medicine & Rehabilitation

## 2011-05-06 ENCOUNTER — Encounter

## 2011-05-07 ENCOUNTER — Ambulatory Visit: Admitting: Rehabilitation

## 2011-05-08 ENCOUNTER — Ambulatory Visit: Admitting: Rehabilitation

## 2011-05-09 ENCOUNTER — Ambulatory Visit: Admitting: Rehabilitation

## 2011-05-12 ENCOUNTER — Ambulatory Visit: Attending: Orthopedic Surgery | Admitting: Rehabilitative and Restorative Service Providers"

## 2011-05-12 ENCOUNTER — Encounter: Payer: Self-pay | Admitting: Rehabilitation

## 2011-05-12 DIAGNOSIS — M545 Low back pain, unspecified: Secondary | ICD-10-CM | POA: Insufficient documentation

## 2011-05-12 DIAGNOSIS — R262 Difficulty in walking, not elsewhere classified: Secondary | ICD-10-CM | POA: Insufficient documentation

## 2011-05-12 DIAGNOSIS — IMO0001 Reserved for inherently not codable concepts without codable children: Secondary | ICD-10-CM | POA: Insufficient documentation

## 2011-05-12 DIAGNOSIS — M2550 Pain in unspecified joint: Secondary | ICD-10-CM | POA: Insufficient documentation

## 2011-05-12 DIAGNOSIS — M5126 Other intervertebral disc displacement, lumbar region: Secondary | ICD-10-CM | POA: Insufficient documentation

## 2011-05-12 DIAGNOSIS — M2560 Stiffness of unspecified joint, not elsewhere classified: Secondary | ICD-10-CM | POA: Insufficient documentation

## 2011-05-14 ENCOUNTER — Encounter: Payer: Self-pay | Admitting: Rehabilitation

## 2011-05-14 ENCOUNTER — Ambulatory Visit: Admitting: Rehabilitative and Restorative Service Providers"

## 2011-05-16 ENCOUNTER — Ambulatory Visit: Admitting: Physical Therapy

## 2011-05-16 ENCOUNTER — Encounter: Payer: Self-pay | Admitting: Rehabilitation

## 2011-05-19 ENCOUNTER — Ambulatory Visit: Admitting: Physical Therapy

## 2011-05-20 ENCOUNTER — Ambulatory Visit: Admitting: Rehabilitative and Restorative Service Providers"

## 2011-05-21 ENCOUNTER — Ambulatory Visit: Admitting: Rehabilitative and Restorative Service Providers"

## 2011-05-23 ENCOUNTER — Encounter: Attending: Physical Medicine & Rehabilitation

## 2011-05-23 ENCOUNTER — Ambulatory Visit: Admitting: Physical Medicine & Rehabilitation

## 2011-05-23 DIAGNOSIS — K594 Anal spasm: Secondary | ICD-10-CM | POA: Insufficient documentation

## 2011-05-23 DIAGNOSIS — Z79899 Other long term (current) drug therapy: Secondary | ICD-10-CM | POA: Insufficient documentation

## 2011-05-23 DIAGNOSIS — R209 Unspecified disturbances of skin sensation: Secondary | ICD-10-CM

## 2011-05-23 DIAGNOSIS — M79609 Pain in unspecified limb: Secondary | ICD-10-CM | POA: Insufficient documentation

## 2011-05-23 DIAGNOSIS — R5383 Other fatigue: Secondary | ICD-10-CM | POA: Insufficient documentation

## 2011-05-23 DIAGNOSIS — M5380 Other specified dorsopathies, site unspecified: Secondary | ICD-10-CM | POA: Insufficient documentation

## 2011-05-23 DIAGNOSIS — M62838 Other muscle spasm: Secondary | ICD-10-CM | POA: Insufficient documentation

## 2011-05-23 DIAGNOSIS — M7989 Other specified soft tissue disorders: Secondary | ICD-10-CM | POA: Insufficient documentation

## 2011-05-23 DIAGNOSIS — R5381 Other malaise: Secondary | ICD-10-CM | POA: Insufficient documentation

## 2011-05-23 DIAGNOSIS — M961 Postlaminectomy syndrome, not elsewhere classified: Secondary | ICD-10-CM

## 2011-05-23 DIAGNOSIS — M5126 Other intervertebral disc displacement, lumbar region: Secondary | ICD-10-CM | POA: Insufficient documentation

## 2011-05-23 DIAGNOSIS — M545 Low back pain, unspecified: Secondary | ICD-10-CM | POA: Insufficient documentation

## 2011-05-23 DIAGNOSIS — M654 Radial styloid tenosynovitis [de Quervain]: Secondary | ICD-10-CM

## 2011-05-23 DIAGNOSIS — R609 Edema, unspecified: Secondary | ICD-10-CM | POA: Insufficient documentation

## 2011-05-23 DIAGNOSIS — G894 Chronic pain syndrome: Secondary | ICD-10-CM | POA: Insufficient documentation

## 2011-05-23 DIAGNOSIS — N3941 Urge incontinence: Secondary | ICD-10-CM | POA: Insufficient documentation

## 2011-05-23 DIAGNOSIS — Z981 Arthrodesis status: Secondary | ICD-10-CM | POA: Insufficient documentation

## 2011-05-23 NOTE — Assessment & Plan Note (Signed)
CHIEF COMPLAINT:  Left wrist pain.  SECONDARY COMPLAINT:  Low back pain.  THIRD COMPLAINT:  Weakness in the right leg.  HISTORY:  A 45 year old female who has a history of work-related back injury.  Underwent an L4-L5, and L5-S1 fusion by Dr. Yevette Edwards, which was performed on July 24, 012.  She came to inpatient rehabilitation unit on January 02, 2011.  She stayed on our unit until January 20, 2011.  She was discharged to home.  She has been seen in the outpatient setting on 2 occasions, March 14, 2011 and April 07, 2011.  Medications have been adjusted over the last couple of months, stopping the tramadol, stopping the oxycodone, bumping up the baclofen from 10 t.i.d. to q.i.d., and switching the OxyContin to Opana ER 20 mg b.i.d.  New problem includes left wrist pain.  She has been putting a lot of weight through her hands using her walker.  She states she mainly uses a walker in the morning and in the evening, but in the mid day can walk without it.  She has been following up with Dr. Dallas Schimke, for her lower extremity swelling, history of cellulitis as well as blood pressure.  She has additional complaints of right foot shaking.  One therapist told her it was clonus.  The other one thought it was weakness or fatigue.  PAST VOCATIONAL HISTORY:  Doctor, hospital.  REVIEW OF SYSTEMS:  Positive for numbness, tremor, trouble walking, spasms, urinary retention, limb swelling, abdominal pain, poor appetite, constipation, weight gain, bladder bowel symptoms which are the same as prior visits.  Her pain score is 6 to 7/10 which is actually better than last time 7-9/10. Her sleep continues to be poor per her report.  SOCIAL HISTORY:  Lives alone.  Nonsmoker, nondrinker.  PHYSICAL EXAMINATION:  VITAL SIGNS:  Blood pressure 144/89, pulse 86, respirations 18, O2 saturation 96% on room air. MUSCULOSKELETAL:  Left wrist is positive for Finkelstein maneuver.  No erythema over the wrist, no  joint swelling.  No other sensory change in the hand.  Her grip is 4/5 on that side.  Five on the right side. Remainder of the upper extremity strength is normal. In the lower extremity, she has 4/5 strength in ankle dorsiflexion, plantar flexion on the right side, and 5 in the knee extensor, and 4 at the hip flexor.  On the left side 5/5 throughout.  She has 2 to 3+ edema in the left lower extremity and 1 to 2+ edema in the right lower extremity.  No evidence of erythema.  Her hip knee and ankle range of motion are good.  Her tone is normal in bilateral lower extremities.  In terms of deep tendon reflexes, no evidence of clonus.  She does have a persistent right ankle plantar flexion, dorsiflexion, tremor.  I watched her ambulate and watching her she was not exhibiting any of this and she was able to clear her right foot well.  And once she noticed I was watching has had low.  She started dragging her foot and foot started shaking.  IMPRESSION: 1. Lumbar post laminectomy syndrome.  She has a history of right lower     extremity weakness, I am not clear whether this really is a true     radiculopathy in terms of nerve damage or just some pain inhibition     and some functional overlay.  I would like to check EMG, NCV,     mainly focusing on the needle exam portion in the  right lower     extremity.  Checking out the TA, checking out the gastrocs as well     as more proximal muscles. 2. Left wrist De Quervain tenosynovitis.  I do think this is related     to her use of the walker which is related to the back problem.  I     will order some OT.  We will order some Voltaren gel for the next 2     months.  OT can favor to get a thumb spica. 3. Medication management.  She has had no signs of aberrant drug     behavior.  She verbalizes that she would like to get off some of     her pain medicine.  We will make some alterations in her medication     regimen changing her Opana ER from 20 b.i.d.  to 10 t.i.d. and     increasing her baclofen from 10 t.i.d. to 20 t.i.d.  Discussed with     patient, agrees with plan.  Also see her back for the EMG.     Erick Colace, M.D. Electronically Signed    AEK/MedQ D:  05/23/2011 11:41:28  T:  05/23/2011 13:59:20  Job #:  [id number]

## 2011-05-26 ENCOUNTER — Ambulatory Visit: Admitting: Rehabilitative and Restorative Service Providers"

## 2011-05-27 ENCOUNTER — Ambulatory Visit: Payer: Federal, State, Local not specified - PPO

## 2011-05-28 ENCOUNTER — Ambulatory Visit: Admitting: Rehabilitative and Restorative Service Providers"

## 2011-05-29 ENCOUNTER — Ambulatory Visit: Admitting: Occupational Therapy

## 2011-05-30 ENCOUNTER — Ambulatory Visit: Admitting: Rehabilitative and Restorative Service Providers"

## 2011-05-30 ENCOUNTER — Ambulatory Visit: Admitting: Physical Medicine & Rehabilitation

## 2011-05-30 ENCOUNTER — Encounter

## 2011-05-30 ENCOUNTER — Ambulatory Visit: Admitting: Occupational Therapy

## 2011-05-30 DIAGNOSIS — M961 Postlaminectomy syndrome, not elsewhere classified: Secondary | ICD-10-CM

## 2011-06-05 ENCOUNTER — Ambulatory Visit: Attending: Orthopedic Surgery | Admitting: Physical Therapy

## 2011-06-05 ENCOUNTER — Ambulatory Visit: Admitting: Occupational Therapy

## 2011-06-05 DIAGNOSIS — M5126 Other intervertebral disc displacement, lumbar region: Secondary | ICD-10-CM | POA: Insufficient documentation

## 2011-06-05 DIAGNOSIS — M6281 Muscle weakness (generalized): Secondary | ICD-10-CM | POA: Insufficient documentation

## 2011-06-05 DIAGNOSIS — M25549 Pain in joints of unspecified hand: Secondary | ICD-10-CM | POA: Insufficient documentation

## 2011-06-05 DIAGNOSIS — IMO0001 Reserved for inherently not codable concepts without codable children: Secondary | ICD-10-CM | POA: Insufficient documentation

## 2011-06-06 ENCOUNTER — Ambulatory Visit: Admitting: Rehabilitative and Restorative Service Providers"

## 2011-06-11 ENCOUNTER — Encounter: Admitting: Occupational Therapy

## 2011-06-11 ENCOUNTER — Ambulatory Visit: Payer: Self-pay | Admitting: Rehabilitative and Restorative Service Providers"

## 2011-06-12 ENCOUNTER — Ambulatory Visit: Attending: Orthopedic Surgery | Admitting: Rehabilitative and Restorative Service Providers"

## 2011-06-12 DIAGNOSIS — M25549 Pain in joints of unspecified hand: Secondary | ICD-10-CM | POA: Insufficient documentation

## 2011-06-12 DIAGNOSIS — M5126 Other intervertebral disc displacement, lumbar region: Secondary | ICD-10-CM | POA: Insufficient documentation

## 2011-06-12 DIAGNOSIS — IMO0001 Reserved for inherently not codable concepts without codable children: Secondary | ICD-10-CM | POA: Insufficient documentation

## 2011-06-12 DIAGNOSIS — M6281 Muscle weakness (generalized): Secondary | ICD-10-CM | POA: Insufficient documentation

## 2011-06-13 ENCOUNTER — Ambulatory Visit: Admitting: Occupational Therapy

## 2011-06-13 ENCOUNTER — Ambulatory Visit: Admitting: Rehabilitative and Restorative Service Providers"

## 2011-06-16 ENCOUNTER — Ambulatory Visit: Attending: Orthopedic Surgery | Admitting: Rehabilitative and Restorative Service Providers"

## 2011-06-16 ENCOUNTER — Ambulatory Visit: Admitting: Occupational Therapy

## 2011-06-16 DIAGNOSIS — M6281 Muscle weakness (generalized): Secondary | ICD-10-CM | POA: Insufficient documentation

## 2011-06-16 DIAGNOSIS — M2550 Pain in unspecified joint: Secondary | ICD-10-CM | POA: Insufficient documentation

## 2011-06-16 DIAGNOSIS — R262 Difficulty in walking, not elsewhere classified: Secondary | ICD-10-CM | POA: Insufficient documentation

## 2011-06-16 DIAGNOSIS — Z5189 Encounter for other specified aftercare: Secondary | ICD-10-CM | POA: Insufficient documentation

## 2011-06-16 DIAGNOSIS — M2560 Stiffness of unspecified joint, not elsewhere classified: Secondary | ICD-10-CM | POA: Insufficient documentation

## 2011-06-16 DIAGNOSIS — M5126 Other intervertebral disc displacement, lumbar region: Secondary | ICD-10-CM | POA: Insufficient documentation

## 2011-06-16 DIAGNOSIS — M25549 Pain in joints of unspecified hand: Secondary | ICD-10-CM | POA: Insufficient documentation

## 2011-06-17 ENCOUNTER — Encounter: Attending: Physical Medicine & Rehabilitation

## 2011-06-17 ENCOUNTER — Ambulatory Visit: Admitting: Physical Medicine & Rehabilitation

## 2011-06-17 DIAGNOSIS — M5380 Other specified dorsopathies, site unspecified: Secondary | ICD-10-CM | POA: Insufficient documentation

## 2011-06-17 DIAGNOSIS — G894 Chronic pain syndrome: Secondary | ICD-10-CM | POA: Insufficient documentation

## 2011-06-17 DIAGNOSIS — M7989 Other specified soft tissue disorders: Secondary | ICD-10-CM | POA: Insufficient documentation

## 2011-06-17 DIAGNOSIS — M79609 Pain in unspecified limb: Secondary | ICD-10-CM | POA: Insufficient documentation

## 2011-06-17 DIAGNOSIS — K594 Anal spasm: Secondary | ICD-10-CM | POA: Insufficient documentation

## 2011-06-17 DIAGNOSIS — R609 Edema, unspecified: Secondary | ICD-10-CM | POA: Insufficient documentation

## 2011-06-17 DIAGNOSIS — R5383 Other fatigue: Secondary | ICD-10-CM | POA: Insufficient documentation

## 2011-06-17 DIAGNOSIS — M62838 Other muscle spasm: Secondary | ICD-10-CM | POA: Insufficient documentation

## 2011-06-17 DIAGNOSIS — R5381 Other malaise: Secondary | ICD-10-CM | POA: Insufficient documentation

## 2011-06-17 DIAGNOSIS — Z981 Arthrodesis status: Secondary | ICD-10-CM | POA: Insufficient documentation

## 2011-06-17 DIAGNOSIS — N3941 Urge incontinence: Secondary | ICD-10-CM | POA: Insufficient documentation

## 2011-06-17 DIAGNOSIS — R209 Unspecified disturbances of skin sensation: Secondary | ICD-10-CM | POA: Insufficient documentation

## 2011-06-17 DIAGNOSIS — M545 Low back pain, unspecified: Secondary | ICD-10-CM | POA: Insufficient documentation

## 2011-06-17 DIAGNOSIS — M961 Postlaminectomy syndrome, not elsewhere classified: Secondary | ICD-10-CM

## 2011-06-17 DIAGNOSIS — Z79899 Other long term (current) drug therapy: Secondary | ICD-10-CM | POA: Insufficient documentation

## 2011-06-17 NOTE — Assessment & Plan Note (Signed)
CHIEF COMPLAINT:  Back pain and spasms.  HISTORY:  Molly Norton is a 46 year old female, work-related back injury, failed conservative care for over a year.  She underwent L4-5 and L5-S1 fusion 360 degrees AP with anterior exposure per Dr. Tawanna Cooler Early.  She was in the Inpatient Rehabilitation Unit from January 02, 2011, through January 20, 2011.  She had problems with postoperative urinary incontinence.  She underwent Urological consultation on December 25, 2010, per Dr. Isabel Caprice.  She had urinary retention after Foley discontinuation. This improved to a certain degree, but still has incontinence.  She has not had any urologic follow up.  She has no fever.  No dysuria.  With regards to her back pain, we switched her medications and reduced her Opana ER from 20 b.i.d. to 10 mg t.i.d.  She states that since that time she has had increased pain.  She has been dealing with left wrist, de Quervain's tenosynovitis, this is overall improving.  She has a splint on.  She was getting some Voltaren gel, but now is going to be starting some dexamethasone iontophoresis.  Pain level is in the 6 to 7 out of 10 range on average, but currently 4, walking tolerance 15 minutes with [place] crutch in the right hand. She does drive.  She is not working and has not worked since June 05, 2009.  She has review of systems positive for right leg numbness, spasms, trouble walking, and these are chronic problems.  She has chronic constipation which is relieved with Senokot 2 p.o. b.i.d. with MiraLAX.  SOCIAL HISTORY:  Lives alone, nonsmoker, nondrinker.  PHYSICAL EXAMINATION:  VITAL SIGNS:  Blood pressure 168/108.  She states that her back spasm is bad and that is why her blood pressure is up right now.  She states that when her pain goes away, her blood pressure normalizes.  O2 sat 99%, pulse 94, respirations 16, height 5 feet 7 inches, weight 227 pounds. MUSCULOSKELETAL:  Her back has no tenderness to palpation.   Straight leg raising test is negative.  She has normal strength in lower extremities. She has 2+ pitting edema, left lower extremity and 1+ on the right lower extremity, pretibial.  Her left wrist has no evidence of swelling.  She has mild pain with Finkelstein's maneuver.  No tender to palpation over the distal radial forearm.  IMPRESSION: 1. Lumbar post laminectomy syndrome, history of lumbar fusion, work-     related injury with chronic pain. 2. Urinary retention overall improved, but does still have decline     from baseline following surgery, has been seen by Urology in the     hospital, but did not follow up with them.  She may need some     urodynamic testing.  We will send her back to Dr. Isabel Caprice to follow     up on this. 3. de Quervain's tenosynovitis, trial of iontophoresis. 4. In terms of narcotic analgesics, we will increase it back to 20 mg     b.i.d., the Opana ER.  She can continue the tizanidine, but only 2     mg at bedtime in addition to Flexeril 10 mg at bedtime.     Erick Colace, M.D. Electronically Signed    AEK/MedQ D:  06/17/2011 09:46:35  T:  06/17/2011 19:58:14  Job #:  [id number]  cc:   Estill Bamberg, MD Fax: [phone]  Juleen China, MD Fax: [phone]

## 2011-06-18 ENCOUNTER — Ambulatory Visit: Admitting: Rehabilitative and Restorative Service Providers"

## 2011-06-20 ENCOUNTER — Ambulatory Visit: Admitting: Rehabilitative and Restorative Service Providers"

## 2011-06-23 ENCOUNTER — Encounter: Admitting: Occupational Therapy

## 2011-06-23 ENCOUNTER — Ambulatory Visit: Admitting: Rehabilitative and Restorative Service Providers"

## 2011-06-23 ENCOUNTER — Ambulatory Visit: Admitting: Occupational Therapy

## 2011-06-24 ENCOUNTER — Ambulatory Visit: Admitting: Rehabilitative and Restorative Service Providers"

## 2011-06-25 ENCOUNTER — Ambulatory Visit: Admitting: Rehabilitative and Restorative Service Providers"

## 2011-06-27 ENCOUNTER — Ambulatory Visit: Admitting: Rehabilitative and Restorative Service Providers"

## 2011-06-30 ENCOUNTER — Ambulatory Visit: Admitting: Rehabilitative and Restorative Service Providers"

## 2011-06-30 ENCOUNTER — Encounter: Payer: Self-pay | Admitting: Occupational Therapy

## 2011-07-01 ENCOUNTER — Ambulatory Visit: Admitting: Occupational Therapy

## 2011-07-01 NOTE — Progress Notes (Signed)
i suspect this is my wife's patient - seen by Dr. Cleta Alberts in the past. Not a [facility] patient.

## 2011-07-02 ENCOUNTER — Ambulatory Visit: Admitting: Occupational Therapy

## 2011-07-02 ENCOUNTER — Ambulatory Visit: Admitting: Rehabilitative and Restorative Service Providers"

## 2011-07-04 ENCOUNTER — Ambulatory Visit: Admitting: Physical Therapy

## 2011-07-07 ENCOUNTER — Ambulatory Visit: Admitting: Occupational Therapy

## 2011-07-08 ENCOUNTER — Ambulatory Visit: Admitting: Rehabilitative and Restorative Service Providers"

## 2011-07-09 ENCOUNTER — Ambulatory Visit: Admitting: Occupational Therapy

## 2011-07-09 ENCOUNTER — Ambulatory Visit: Admitting: Rehabilitative and Restorative Service Providers"

## 2011-07-14 ENCOUNTER — Ambulatory Visit: Admitting: Rehabilitative and Restorative Service Providers"

## 2011-07-14 ENCOUNTER — Ambulatory Visit: Attending: Orthopedic Surgery | Admitting: Occupational Therapy

## 2011-07-14 DIAGNOSIS — M6281 Muscle weakness (generalized): Secondary | ICD-10-CM | POA: Insufficient documentation

## 2011-07-14 DIAGNOSIS — M5126 Other intervertebral disc displacement, lumbar region: Secondary | ICD-10-CM | POA: Insufficient documentation

## 2011-07-14 DIAGNOSIS — M25549 Pain in joints of unspecified hand: Secondary | ICD-10-CM | POA: Insufficient documentation

## 2011-07-14 DIAGNOSIS — IMO0001 Reserved for inherently not codable concepts without codable children: Secondary | ICD-10-CM | POA: Insufficient documentation

## 2011-07-15 ENCOUNTER — Ambulatory Visit: Payer: Worker's Compensation | Admitting: Physical Medicine & Rehabilitation

## 2011-07-15 ENCOUNTER — Encounter: Attending: Physical Medicine & Rehabilitation

## 2011-07-15 DIAGNOSIS — M961 Postlaminectomy syndrome, not elsewhere classified: Secondary | ICD-10-CM

## 2011-07-15 DIAGNOSIS — M5380 Other specified dorsopathies, site unspecified: Secondary | ICD-10-CM | POA: Insufficient documentation

## 2011-07-15 DIAGNOSIS — Z79899 Other long term (current) drug therapy: Secondary | ICD-10-CM | POA: Insufficient documentation

## 2011-07-15 DIAGNOSIS — M62838 Other muscle spasm: Secondary | ICD-10-CM | POA: Insufficient documentation

## 2011-07-15 DIAGNOSIS — M79609 Pain in unspecified limb: Secondary | ICD-10-CM | POA: Insufficient documentation

## 2011-07-15 DIAGNOSIS — Z981 Arthrodesis status: Secondary | ICD-10-CM | POA: Insufficient documentation

## 2011-07-15 DIAGNOSIS — R5381 Other malaise: Secondary | ICD-10-CM | POA: Insufficient documentation

## 2011-07-15 DIAGNOSIS — G894 Chronic pain syndrome: Secondary | ICD-10-CM | POA: Insufficient documentation

## 2011-07-15 DIAGNOSIS — R209 Unspecified disturbances of skin sensation: Secondary | ICD-10-CM | POA: Insufficient documentation

## 2011-07-15 DIAGNOSIS — K594 Anal spasm: Secondary | ICD-10-CM | POA: Insufficient documentation

## 2011-07-15 DIAGNOSIS — M545 Low back pain, unspecified: Secondary | ICD-10-CM | POA: Insufficient documentation

## 2011-07-15 DIAGNOSIS — R609 Edema, unspecified: Secondary | ICD-10-CM | POA: Insufficient documentation

## 2011-07-15 DIAGNOSIS — N3941 Urge incontinence: Secondary | ICD-10-CM | POA: Insufficient documentation

## 2011-07-15 DIAGNOSIS — M7989 Other specified soft tissue disorders: Secondary | ICD-10-CM | POA: Insufficient documentation

## 2011-07-15 NOTE — Assessment & Plan Note (Signed)
Molly Norton is a 46 year old female work-related back injury, who failed conservative care for over a year.  She underwent L4-5 and L5-S1 fusion, anterior and posterior in July 2012, and was on the Inpatient Rehab Unit from July 26 through January 20, 2011.  She had some problems with postoperative urinary incontinence, had urological consultation per Dr. Isabel Caprice in the hospital, but apparently he does not accept workers comp outside the hospital.  We have been adjusting pain medications, trialed Opana ER 20 mg b.i.d., reduction to 10 mg t.i.d. However, she had increased pain after that so we put her back on her 20 b.i.d.  In addition, she has had back spasms which he treats mainly with ice.  I suggested heat and I have written a prescription for ThermaCare pads that she can wear.  In addition, she is on baclofen 20 mg q.i.d. and was taking some Flexeril at night, but this really did not seem to help a lot per her report.  Also, took some tizanidine 2 mg at night, which we will increase to 4 mg.  No wrist pain currently.  She has been treated for [employer] tenosynovitis which was secondary to crutch use.  Pain level is 5/10, currently 6-7 on average which is in her usual range.  Pain interferes with activity at a moderate level.  She walks slowly.  Her pain does increase with walking, bending, sitting, inactivity and standing and improves with rest, ice, therapy and medication.  She really has not tried heat much, however.  She could walk 10-15 minutes at a time.  She does drive.  She does not climb steps. Her last date of employment June 05, 2009.  REVIEW OF SYSTEMS:  Bladder problems, numbness, tingling, spasms, poor appetite and limb swelling.  She sees her primary physician for her limb swelling.  FUNCTIONAL STATUS:  She is independent with all her self-care.  CURRENT THERAPY:  She is getting outpatient therapy at [facility].  SOCIAL HISTORY:  Single, lives  alone, nonsmoker, nondrinker.  PHYSICAL EXAMINATION:  VITAL SIGNS:  Blood pressure 161/99, pulse 103, respiratory rate is 18 and O2 sat 97% on room air.  Weight 226 pounds, height 5 feet, 7 inches. GENERAL:  Anxious-appearing female, otherwise in no acute distress.  She tends to wiggle her right foot and has done so since I have known her. Her mood and affect are flat, depressed. EXTREMITIES:  Gait is normal in terms of toe drag or knee instability, although she has widened base support and a short step length.  Her coordination in the upper extremity is normal.  Lower extremity, she has decreased speed of movement in the right lower extremity.  She has no evidence of abnormal tone in bilateral lower extremities.  Deep tendon reflexes are normal.  Straight leg raising test is negative.  Her back incisions are well healed.  I did not check the abdominal incision.  IMPRESSION:  Lumbar postlaminectomy syndrome, chronic pain syndrome.  PLAN: 1. We will continue her Opana ER 20 mg b.i.d. 2. Increased her Zanaflex to 4 mg at bedtime. 3. Continue baclofen 20 mg q.i.d. 4. Referral to Pain Psychology, Dr[facility] Leon[facility]. 5. I will see her back in 1 month. 6. She reports that she will see somebody from [facility] in     terms of her bladder incontinence. 7. Followup with Dr. Yevette Edwards.  She indicates that he is arranging for     an FCE through his office  sometime in March.     Erick Colace, M.D. Electronically Signed    AEK/MedQ D:  07/15/2011 10:09:57  T:  07/15/2011 17:35:29  Job #:  [id number]  cc:   Estill Bamberg, MD Fax: [phone]  Gladstone Pih, Ph.D. [address] [place] [address]  Juleen China, MD Fax: [phone]

## 2011-07-16 ENCOUNTER — Ambulatory Visit: Admitting: Rehabilitative and Restorative Service Providers"

## 2011-07-16 ENCOUNTER — Ambulatory Visit: Admitting: Occupational Therapy

## 2011-07-21 ENCOUNTER — Encounter: Payer: Self-pay | Admitting: Occupational Therapy

## 2011-07-21 ENCOUNTER — Ambulatory Visit: Payer: Self-pay | Admitting: Rehabilitative and Restorative Service Providers"

## 2011-07-23 ENCOUNTER — Encounter: Payer: Self-pay | Admitting: Occupational Therapy

## 2011-07-23 ENCOUNTER — Ambulatory Visit: Payer: Self-pay | Admitting: Rehabilitative and Restorative Service Providers"

## 2011-07-28 ENCOUNTER — Ambulatory Visit: Admitting: Occupational Therapy

## 2011-07-28 DIAGNOSIS — N3946 Mixed incontinence: Secondary | ICD-10-CM | POA: Insufficient documentation

## 2011-07-29 ENCOUNTER — Ambulatory Visit: Payer: Self-pay | Admitting: Rehabilitative and Restorative Service Providers"

## 2011-07-29 ENCOUNTER — Other Ambulatory Visit: Payer: Self-pay | Admitting: Family Medicine

## 2011-08-01 ENCOUNTER — Ambulatory Visit: Payer: Self-pay | Admitting: Rehabilitative and Restorative Service Providers"

## 2011-08-01 ENCOUNTER — Encounter: Payer: Self-pay | Admitting: Occupational Therapy

## 2011-08-08 DIAGNOSIS — F4542 Pain disorder with related psychological factors: Secondary | ICD-10-CM

## 2011-08-12 ENCOUNTER — Ambulatory Visit: Payer: Self-pay | Admitting: Physical Medicine & Rehabilitation

## 2011-08-12 ENCOUNTER — Encounter: Attending: Physical Medicine & Rehabilitation

## 2011-08-12 DIAGNOSIS — N319 Neuromuscular dysfunction of bladder, unspecified: Secondary | ICD-10-CM | POA: Insufficient documentation

## 2011-08-12 DIAGNOSIS — M961 Postlaminectomy syndrome, not elsewhere classified: Secondary | ICD-10-CM | POA: Insufficient documentation

## 2011-08-12 DIAGNOSIS — M5380 Other specified dorsopathies, site unspecified: Secondary | ICD-10-CM | POA: Insufficient documentation

## 2011-08-28 ENCOUNTER — Ambulatory Visit (HOSPITAL_BASED_OUTPATIENT_CLINIC_OR_DEPARTMENT_OTHER): Admitting: Physical Medicine & Rehabilitation

## 2011-08-28 ENCOUNTER — Encounter

## 2011-08-28 ENCOUNTER — Encounter: Payer: Self-pay | Admitting: Physical Medicine & Rehabilitation

## 2011-08-28 ENCOUNTER — Other Ambulatory Visit: Payer: Self-pay | Admitting: *Deleted

## 2011-08-28 VITALS — BP 166/105 | HR 85 | Resp 18 | Ht 67.5 in | Wt 215.0 lb

## 2011-08-28 DIAGNOSIS — M961 Postlaminectomy syndrome, not elsewhere classified: Secondary | ICD-10-CM

## 2011-08-28 DIAGNOSIS — G894 Chronic pain syndrome: Secondary | ICD-10-CM

## 2011-08-28 MED ORDER — BACLOFEN 20 MG PO TABS: 20.0000 mg | ORAL_TABLET | Freq: Four times a day (QID) | ORAL | Status: DC

## 2011-08-28 MED ORDER — TIZANIDINE HCL 4 MG PO TABS: 4.0000 mg | ORAL_TABLET | Freq: Three times a day (TID) | ORAL | Status: DC | PRN

## 2011-08-28 MED ORDER — OXYMORPHONE HCL ER 20 MG PO TB12: 20.0000 mg | ORAL_TABLET | Freq: Two times a day (BID) | ORAL | Status: DC

## 2011-08-28 NOTE — Patient Instructions (Signed)
Chronic Back Pain When back pain lasts longer than 3 months, it is called chronic back pain.This pain can be frustrating, but the cause of the pain is rarely dangerous.People with chronic back pain often go through certain periods that are more intense (flare-ups). CAUSES Chronic back pain can be caused by wear and tear (degeneration) on different structures in your back. These structures may include bones, ligaments, or discs. This degeneration may result in more pressure being placed on the nerves that travel to your legs and feet. This can lead to pain traveling from the low back down the back of the legs. When pain lasts longer than 3 months, it is not unusual for people to experience anxiety or depression. Anxiety and depression can also contribute to low back pain. TREATMENT  Establish a regular exercise plan. This is critical to improving your functional level.   Have a self-management plan for when you flare-up. Flare-ups rarely require a medical visit. Regular exercise will help reduce the intensity and frequency of your flare-ups.   Manage how you feel about your back pain and the rest of your life. Anxiety, depression, and feeling that you cannot alter your back pain have been shown to make back pain more intense and debilitating.   Medicines should never be your only treatment. They should be used along with other treatments to help you return to a more active lifestyle.   Procedures such as injections or surgery may be helpful but are rarely necessary. You may be able to get the same results with physical therapy or chiropractic care.  HOME CARE INSTRUCTIONS  Avoid bending, heavy lifting, prolonged sitting, and activities which make the problem worse.   Continue normal activity as much as possible.   Take brief periods of rest throughout the day to reduce your pain during flare-ups.   Follow your back exercise rehabilitation program. This can help reduce symptoms and prevent  more pain.   Only take over-the-counter or prescription medicines as directed by your caregiver. Muscle relaxants are sometimes prescribed. Narcotic pain medicine is discouraged for long-term pain, since addiction is a possible outcome.   If you smoke, quit.   Eat healthy foods and maintain a recommended body weight.  SEEK IMMEDIATE MEDICAL CARE IF:   You have weakness or numbness in one of your legs or feet.   You have trouble controlling your bladder or bowels.   You develop nausea, vomiting, abdominal pain, shortness of breath, or fainting.  Document Released: 07/03/2004 Document Revised: 05/15/2011 Document Reviewed: 05/10/2011 Exit[employer] Patient Information 2012 [employer]. 

## 2011-08-28 NOTE — Progress Notes (Signed)
Subjective:    Patient ID: Molly Norton, female    DOB: [birth year], 46 y.o.   MRN: [id number]  HPI   Molly Norton is a 46 year old female work-related back injury, who failed  conservative care for over a year. She underwent L4-5 and L5-S1 fusion,  anterior and posterior in July 2012, and was on the Inpatient Rehab Unit  from July 26 through January 20, 2011. She had some problems with  postoperative urinary incontinence, had urological consultation per Dr.  Isabel Caprice in the hospital, but apparently he does not accept workers comp  outside the hospital.  We have been adjusting pain medications, trialed Opana ER 20 mg b.i.d.,  reduction to 10 mg t.i.d. However, she had increased pain after that so  we put her back on her 20 b.i.d. In addition, she has had back spasms  which he treats mainly with ice. I suggested heat and I have written a  prescription for ThermaCare pads that she can wear. In addition, she is  on baclofen 20 mg q.i.d. and was taking some Flexeril at night, but this  really did not seem to help a lot per her report. Also, took some  tizanidine 2 mg at night, which we will increase to 4 mg.  No wrist pain currently. She has been treated for [employer]  tenosynovitis which was secondary to crutch use.  Has had bladder studies done, Dr Gaynelle Arabian started Toviaz. No urology followup as planned Dr Yevette Edwards ordered aquatherapy . FCE to be done soon Pain consistently at a 4 over the last 2 weeks. Currently having a spasm. No longer using a cane on a consistent basis. Spasms on both sides of the back but worse on the right side.  Pain Inventory Average Pain 7 Pain Right Now 4 My pain is constant, sharp, stabbing and aching  In the last 24 hours, has pain interfered with the following? General activity 4 Relation with others 4 Enjoyment of life 4 What TIME of day is your pain at its worst? evening Sleep (in general) Poor  Pain is worse with: walking, sitting,  inactivity, standing and some activites Pain improves with: rest, heat/ice, therapy/exercise, pacing activities and medication Relief from Meds: 7  Mobility Uses a walker at timeswalk with assistance use a walker how many minutes can you walk? 15-20 ability to climb steps?  yes do you drive?  yes Do you have any goals in this area?  yes  Function employed # of hrs/week not working  I need assistance with the following:  meal prep and household duties Do you have any goals in this area?  yes  Neuro/Psych bowel control problems tingling trouble walking spasms  Prior Studies Any changes since last visit?  no x-rays  Physicians involved in your care Primary care Dr Yevette Edwards [facility] [address]      Review of Systems  Constitutional: Negative.   HENT: Negative.   Eyes: Negative.   Respiratory: Negative.   Cardiovascular: Negative.   Gastrointestinal: Negative.   Genitourinary: Positive for urgency and frequency.  Musculoskeletal: Positive for myalgias and gait problem.  Skin: Negative.   Neurological: Negative.   Hematological: Negative.   Psychiatric/Behavioral: Negative.        Objective:   Physical Exam  Constitutional: She is oriented to person, place, and time. She appears well-developed and well-nourished.  Musculoskeletal:       Left wrist: She exhibits decreased range of motion and laceration. She exhibits no tenderness, no bony  tenderness, no swelling, no effusion and no deformity.       Lumbar back: She exhibits spasm.       Old laceration L wrist Neg Finklesteins'  Neurological: She is alert and oriented to person, place, and time. She has normal strength.  Reflex Scores:      Tricep reflexes are 1+ on the right side.      Bicep reflexes are 1+ on the right side and 1+ on the left side.      Brachioradialis reflexes are 1+ on the right side and 1+ on the left side.      Patellar reflexes are 1+ on the right side and 1+ on the left  side.      Achilles reflexes are 2+ on the right side and 2+ on the left side. Psychiatric: She has a normal mood and affect. Her behavior is normal. Judgment and thought content normal.       Assessment & Plan:  #1. Lumbar postlaminectomy syndrome. Overall doing well. Seeing orthopedics. Aquatic therapy is starting soon. FCE will be in April. Orthopedic spine surgery will address permanent restrictions.  2. Neurogenic bladder appears to be upper motor neuron with small voiding amounts. Will await the urodynamic testing results. Will continue anticholinergic agent as at 4 mg. Review next month may need to go to 8 mg  3. Back spasms still an issue. Is on a maximum dose of baclofen.  Recommend botulinum toxin injection with a total of 200 units, 100 units on the right and 100 units on the left

## 2011-09-02 ENCOUNTER — Telehealth: Payer: Self-pay | Admitting: Physical Medicine & Rehabilitation

## 2011-09-02 NOTE — Telephone Encounter (Signed)
Patient needs clarification on Tizanidine prescription.

## 2011-09-02 NOTE — Telephone Encounter (Signed)
LM for pt to call back.

## 2011-09-03 ENCOUNTER — Telehealth: Payer: Self-pay | Admitting: *Deleted

## 2011-09-03 NOTE — Telephone Encounter (Signed)
Clarified directions on her tizanidine Rx. 1 tablet  (4mg ) q 8hrs. Total daily dose up to 12mg  total, not 4mg  total.

## 2011-09-03 NOTE — Telephone Encounter (Signed)
LM for pt to call back.

## 2011-09-18 ENCOUNTER — Encounter: Attending: Physical Medicine & Rehabilitation

## 2011-09-18 ENCOUNTER — Ambulatory Visit (HOSPITAL_BASED_OUTPATIENT_CLINIC_OR_DEPARTMENT_OTHER): Admitting: Physical Medicine & Rehabilitation

## 2011-09-18 ENCOUNTER — Encounter: Payer: Self-pay | Admitting: Physical Medicine & Rehabilitation

## 2011-09-18 VITALS — BP 153/88 | HR 82 | Resp 16 | Ht 67.5 in | Wt 215.0 lb

## 2011-09-18 DIAGNOSIS — R5381 Other malaise: Secondary | ICD-10-CM | POA: Insufficient documentation

## 2011-09-18 DIAGNOSIS — M62838 Other muscle spasm: Secondary | ICD-10-CM | POA: Insufficient documentation

## 2011-09-18 DIAGNOSIS — M5126 Other intervertebral disc displacement, lumbar region: Secondary | ICD-10-CM | POA: Insufficient documentation

## 2011-09-18 DIAGNOSIS — R5383 Other fatigue: Secondary | ICD-10-CM | POA: Insufficient documentation

## 2011-09-18 DIAGNOSIS — M7989 Other specified soft tissue disorders: Secondary | ICD-10-CM | POA: Insufficient documentation

## 2011-09-18 DIAGNOSIS — M545 Low back pain, unspecified: Secondary | ICD-10-CM | POA: Insufficient documentation

## 2011-09-18 DIAGNOSIS — N3941 Urge incontinence: Secondary | ICD-10-CM | POA: Insufficient documentation

## 2011-09-18 DIAGNOSIS — Z981 Arthrodesis status: Secondary | ICD-10-CM | POA: Insufficient documentation

## 2011-09-18 DIAGNOSIS — M5380 Other specified dorsopathies, site unspecified: Secondary | ICD-10-CM | POA: Insufficient documentation

## 2011-09-18 DIAGNOSIS — R209 Unspecified disturbances of skin sensation: Secondary | ICD-10-CM | POA: Insufficient documentation

## 2011-09-18 DIAGNOSIS — M961 Postlaminectomy syndrome, not elsewhere classified: Secondary | ICD-10-CM | POA: Insufficient documentation

## 2011-09-18 DIAGNOSIS — R609 Edema, unspecified: Secondary | ICD-10-CM | POA: Insufficient documentation

## 2011-09-18 DIAGNOSIS — M79609 Pain in unspecified limb: Secondary | ICD-10-CM | POA: Insufficient documentation

## 2011-09-18 DIAGNOSIS — K594 Anal spasm: Secondary | ICD-10-CM | POA: Insufficient documentation

## 2011-09-18 DIAGNOSIS — Z79899 Other long term (current) drug therapy: Secondary | ICD-10-CM | POA: Insufficient documentation

## 2011-09-18 DIAGNOSIS — N319 Neuromuscular dysfunction of bladder, unspecified: Secondary | ICD-10-CM | POA: Insufficient documentation

## 2011-09-18 DIAGNOSIS — G894 Chronic pain syndrome: Secondary | ICD-10-CM | POA: Insufficient documentation

## 2011-09-18 MED ORDER — OXYMORPHONE HCL ER 20 MG PO TB12: 20.0000 mg | ORAL_TABLET | Freq: Two times a day (BID) | ORAL | Status: DC

## 2011-09-18 MED ORDER — TIZANIDINE HCL 4 MG PO TABS
4.0000 mg | ORAL_TABLET | Freq: Three times a day (TID) | ORAL | Status: DC | PRN
Start: 2011-09-18 — End: 2012-04-13

## 2011-09-18 NOTE — Patient Instructions (Signed)
Medicine should be starting to take effect in 3 days but maximum at 1 week. It should last up to 3 or 4 months. May use ice for 20 minutes at a time for the first 1-2 days to reduce post injection soreness

## 2011-09-18 NOTE — Progress Notes (Signed)
Botox injection under EMG guidance   Indication muscle spasm severe which has not responded to muscle relaxant medications or physical therapy. Informed consent was obtained after describing risks and benefits of the procedure with the patient these include bleeding bruising and infection as well as risks inherent to the medication. The patient has elected to proceed and has given written consent The patient placed in a prone position 4 areas on the right and 4 areas of the left were marked and prepped with Betadine. These included L4, L5 and S1 paraspinal muscles bilaterally as well as bilateral gluteus medius muscle. 25 units of Botox diluted to 50 units per cc were injected into each side patient tolerated this procedure well All injections done under EMG guidance

## 2011-10-17 ENCOUNTER — Encounter

## 2011-10-17 ENCOUNTER — Encounter: Payer: Self-pay | Admitting: Physical Medicine & Rehabilitation

## 2011-10-17 ENCOUNTER — Ambulatory Visit (HOSPITAL_BASED_OUTPATIENT_CLINIC_OR_DEPARTMENT_OTHER): Admitting: Physical Medicine & Rehabilitation

## 2011-10-17 VITALS — BP 160/102 | HR 100 | Resp 16 | Ht 67.0 in | Wt 210.0 lb

## 2011-10-17 DIAGNOSIS — G894 Chronic pain syndrome: Secondary | ICD-10-CM

## 2011-10-17 DIAGNOSIS — M5126 Other intervertebral disc displacement, lumbar region: Secondary | ICD-10-CM | POA: Insufficient documentation

## 2011-10-17 DIAGNOSIS — M961 Postlaminectomy syndrome, not elsewhere classified: Secondary | ICD-10-CM | POA: Insufficient documentation

## 2011-10-17 MED ORDER — OXYMORPHONE HCL ER 20 MG PO TB12: 20.0000 mg | ORAL_TABLET | Freq: Two times a day (BID) | ORAL | Status: DC

## 2011-10-17 NOTE — Progress Notes (Signed)
Subjective:    Patient ID: Molly Norton, female    DOB: 1965-11-20, 46 y.o.   MRN: [id number]  HPI Molly Norton is a 46 year old female work-related back injury, who failed  conservative care for over a year. She underwent L4-5 and L5-S1 fusion,  anterior and posterior in July 2012, and was on the Inpatient Rehab Unit  from July 26 through January 20, 2011. She had some problems with  postoperative urinary incontinence, had urological consultation per Dr.  Isabel Caprice in the hospital, but apparently he does not accept workers comp  outside the hospital.  We have been adjusting pain medications, trialed Opana ER 20 mg b.i.d.,  reduction to 10 mg t.i.d. However, she had increased pain after that so  we put her back on her 20 b.i.d. In addition, she has had back spasms  which he treats mainly with ice. I suggested heat and I have written a  prescription for ThermaCare pads that she can wear. In addition, she is  on baclofen 20 mg q.i.d. and was taking some Flexeril at night, but this  really did not seem to help a lot per her report. Also, took some  tizanidine 2 mg at night, which we will increase to 4 mg.  No wrist pain currently. She has been treated for [employer]  tenosynovitis which was secondary to crutch use.  Has had bladder studies done, Dr Gaynelle Arabian started Toviaz  Underwent Botox injection in the lumbosacral paraspinals. She had 100 units on the right and 100 units on the left. Her pain intensity has reduced from an average of 6 or 7 to a 4 or 5. Her spasms have remained as frequent but are about 35% less intense  has completed water therapy and is scheduled for an FCE in one week to Pain Inventory Average Pain 5 Pain Right Now 5 My pain is constant, sharp, burning, stabbing and aching  In the last 24 hours, has pain interfered with the following? General activity 4 Relation with others 3 Enjoyment of life 4 What TIME of day is your pain at its worst? daytime and  evening Sleep (in general) Poor  Pain is worse with: walking, bending, sitting, inactivity, standing and some activites Pain improves with: rest, heat/ice, therapy/exercise, pacing activities, medication and injections Relief from Meds: 7  Mobility walk without assistance use a cane use a walker how many minutes can you walk? 20 ability to climb steps?  yes do you drive?  yes use a wheelchair needs help with transfers Do you have any goals in this area?  yes  Function I need assistance with the following:  bathing, toileting, meal prep, household duties and shopping  Neuro/Psych bladder control problems weakness spasms  Prior Studies Any changes since last visit?  no  Physicians involved in your care Any changes since last visit?  no      Review of Systems  Constitutional: Positive for unexpected weight change.       Weight gain  Gastrointestinal: Positive for abdominal pain and constipation.  Musculoskeletal:       Spasms  Neurological: Positive for weakness.  All other systems reviewed and are negative.       Objective:   Physical Exam  Constitutional: She is oriented to person, place, and time. She appears well-developed and well-nourished.  Musculoskeletal:       Lumbar back: She exhibits decreased range of motion. She exhibits no tenderness and no spasm.  Neurological: She is alert and oriented to person,  place, and time. She has normal strength.          Assessment & Plan:  1. 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 She's had a good improvement after botulinum toxin injection for lumbar paraspinal muscles. This is helped her spasms. We'll continue current medications. PA visit in one month I'll see her back in 2 months and reassess for potential Botox reinjection. If she is doi may consider weaning the Opana doseng well in 2 months

## 2011-10-17 NOTE — Patient Instructions (Signed)
Visit with PA in one month Visit with me in 2 months. Will assess whether he needs another Botox injection at that time. Continue current medications until that time. Dr. Yevette Edwards he will discuss any type of work restrictions and return to work recommendations should the [employer]

## 2011-10-21 ENCOUNTER — Telehealth: Payer: Self-pay | Admitting: *Deleted

## 2011-10-21 ENCOUNTER — Ambulatory Visit: Admitting: Physical Medicine & Rehabilitation

## 2011-10-21 ENCOUNTER — Encounter

## 2011-10-21 ENCOUNTER — Encounter: Payer: Self-pay | Admitting: *Deleted

## 2011-10-21 ENCOUNTER — Encounter: Attending: Physical Medicine & Rehabilitation | Admitting: *Deleted

## 2011-10-21 VITALS — BP 159/90 | HR 86 | Resp 16 | Ht 67.0 in | Wt 210.0 lb

## 2011-10-21 DIAGNOSIS — G894 Chronic pain syndrome: Secondary | ICD-10-CM

## 2011-10-21 MED ORDER — KETOROLAC TROMETHAMINE 60 MG/2ML IM SOLN
60.0000 mg | Freq: Once | INTRAMUSCULAR | Status: AC
  Administered 2011-10-21: 60 mg via INTRAMUSCULAR

## 2011-10-21 NOTE — Telephone Encounter (Signed)
Pt came in today for a Toradol injection, per Dr. Wynn Banker.

## 2011-10-21 NOTE — Telephone Encounter (Signed)
Had a procedure done and is now in severe pain in her back. Pain medication she has is not working. She called the on call doctor and was told to call our office and leave a message for Dr. Wynn Banker to call her back.

## 2011-10-21 NOTE — Telephone Encounter (Signed)
I spoke to pt and she states that she is still in pain. She is taking Baclofen, Tizandine and Opana. Had a Functional Capacity Exam yesterday. Has been using heat and trying to rest but nothing is giving her relief. Please advise.

## 2011-10-21 NOTE — Progress Notes (Signed)
Pt came in today for a Toradal injection per Dr. Wynn Banker. Injection was given and pt tolerated procedure well. Advised pt to give injection time to work but call us if she doesn't get some relief in 24 hours.

## 2011-10-26 ENCOUNTER — Other Ambulatory Visit: Payer: Self-pay | Admitting: Physician Assistant

## 2011-10-26 ENCOUNTER — Other Ambulatory Visit: Payer: Self-pay | Admitting: Family Medicine

## 2011-11-18 ENCOUNTER — Encounter: Attending: Physical Medicine & Rehabilitation | Admitting: Physical Medicine and Rehabilitation

## 2011-12-01 ENCOUNTER — Telehealth: Payer: Self-pay | Admitting: Physical Medicine & Rehabilitation

## 2011-12-01 ENCOUNTER — Other Ambulatory Visit: Payer: Self-pay | Admitting: Family Medicine

## 2011-12-01 MED ORDER — POLYETHYLENE GLYCOL 3350 17 GM/SCOOP PO POWD: 17.0000 g | Freq: Every day | ORAL | Status: AC

## 2011-12-01 NOTE — Telephone Encounter (Signed)
Patient would like to be scheduled to have 2 more series of Botox.  Will Dr. Wynn Banker authorize 2 more inj.  Patient said first one was successful, but all her symptoms are coming back

## 2011-12-01 NOTE — Telephone Encounter (Deleted)
Needs a refill on name brand Miralax.

## 2011-12-01 NOTE — Telephone Encounter (Signed)
Patient would like to have a series of 2 more botox injection.  First one was successful, however, issues are starting to come back

## 2011-12-01 NOTE — Telephone Encounter (Signed)
Can pt come back for more Botox injections?

## 2011-12-02 ENCOUNTER — Other Ambulatory Visit: Payer: Self-pay | Admitting: Physical Medicine & Rehabilitation

## 2011-12-02 NOTE — Telephone Encounter (Signed)
It can be repeated as early as 3 months Please check when last injection was performed

## 2011-12-08 ENCOUNTER — Telehealth: Payer: Self-pay | Admitting: *Deleted

## 2011-12-08 MED ORDER — OXYMORPHONE HCL ER 20 MG PO TB12: 20.0000 mg | ORAL_TABLET | Freq: Two times a day (BID) | ORAL | Status: DC

## 2011-12-08 NOTE — Telephone Encounter (Signed)
Notified by vm that her RX is available for pickup

## 2011-12-08 NOTE — Telephone Encounter (Signed)
Calling for her Opana ER 20 mg bid refill. Last ordered 10/17/11.  Rx given to Su Monks PA to sign.

## 2011-12-19 ENCOUNTER — Ambulatory Visit (INDEPENDENT_AMBULATORY_CARE_PROVIDER_SITE_OTHER): Payer: Federal, State, Local not specified - PPO | Admitting: Family Medicine

## 2011-12-19 VITALS — BP 158/100 | HR 72 | Temp 98.2°F | Resp 18 | Wt 205.0 lb

## 2011-12-19 DIAGNOSIS — L0390 Cellulitis, unspecified: Secondary | ICD-10-CM

## 2011-12-19 DIAGNOSIS — IMO0002 Reserved for concepts with insufficient information to code with codable children: Secondary | ICD-10-CM

## 2011-12-19 DIAGNOSIS — T23299A Burn of second degree of multiple sites of unspecified wrist and hand, initial encounter: Secondary | ICD-10-CM

## 2011-12-19 DIAGNOSIS — Z23 Encounter for immunization: Secondary | ICD-10-CM

## 2011-12-19 DIAGNOSIS — T300 Burn of unspecified body region, unspecified degree: Secondary | ICD-10-CM

## 2011-12-19 DIAGNOSIS — B379 Candidiasis, unspecified: Secondary | ICD-10-CM

## 2011-12-19 MED ORDER — CEPHALEXIN 500 MG PO CAPS: 500.0000 mg | ORAL_CAPSULE | Freq: Four times a day (QID) | ORAL | Status: AC

## 2011-12-19 MED ORDER — SILVER SULFADIAZINE 1 % EX CREA: TOPICAL_CREAM | Freq: Every day | CUTANEOUS | Status: DC

## 2011-12-19 MED ORDER — TETANUS-DIPHTH-ACELL PERTUSSIS 5-2.5-18.5 LF-MCG/0.5 IM SUSP
0.5000 mL | Freq: Once | INTRAMUSCULAR | Status: AC
  Administered 2011-12-19: 0.5 mL via INTRAMUSCULAR

## 2011-12-19 MED ORDER — FLUCONAZOLE 150 MG PO TABS: 150.0000 mg | ORAL_TABLET | Freq: Once | ORAL | Status: AC
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
## 2012-01-02 ENCOUNTER — Telehealth: Payer: Self-pay | Admitting: *Deleted

## 2012-01-02 DIAGNOSIS — I890 Lymphedema, not elsewhere classified: Secondary | ICD-10-CM

## 2012-01-02 DIAGNOSIS — M62838 Other muscle spasm: Secondary | ICD-10-CM

## 2012-01-02 NOTE — Telephone Encounter (Signed)
Has an order for compression hose for the pt but needs a dx. Please call.  New order for compression stockings and dx code have been faxed to [facility].

## 2012-01-05 ENCOUNTER — Other Ambulatory Visit: Payer: Self-pay | Admitting: Physical Medicine & Rehabilitation

## 2012-01-05 DIAGNOSIS — G8929 Other chronic pain: Secondary | ICD-10-CM

## 2012-01-05 DIAGNOSIS — M62838 Other muscle spasm: Secondary | ICD-10-CM

## 2012-01-16 ENCOUNTER — Telehealth: Payer: Self-pay | Admitting: *Deleted

## 2012-01-16 NOTE — Telephone Encounter (Signed)
Spoke to pt to let her know that she will need to been seen in order to get this Toradol shot. She understood and is going to schedule an appointment.

## 2012-01-16 NOTE — Telephone Encounter (Signed)
Returned to work on Tuesday of this week. Has had severe spasms since then and can't apply any pressure to her L buttock area. Can she come in for a Toradol shot? Please call ASAP.

## 2012-01-17 ENCOUNTER — Ambulatory Visit (INDEPENDENT_AMBULATORY_CARE_PROVIDER_SITE_OTHER): Payer: Federal, State, Local not specified - PPO | Admitting: Emergency Medicine

## 2012-01-17 VITALS — BP 188/106 | HR 87 | Temp 98.4°F | Resp 16 | Ht 67.5 in | Wt 205.0 lb

## 2012-01-17 DIAGNOSIS — G894 Chronic pain syndrome: Secondary | ICD-10-CM

## 2012-01-17 DIAGNOSIS — M961 Postlaminectomy syndrome, not elsewhere classified: Secondary | ICD-10-CM

## 2012-01-17 MED ORDER — KETOROLAC TROMETHAMINE 60 MG/2ML IM SOLN
60.0000 mg | Freq: Once | INTRAMUSCULAR | Status: AC
  Administered 2012-01-17: 60 mg via INTRAMUSCULAR

## 2012-01-17 NOTE — Progress Notes (Signed)
Date:  01/17/2012   Name:  Molly Norton   DOB:  21-Feb-1966   MRN:  [id number] Gender: female  Age: 46 y.o.  PCP:  Emilee Hero, MD    Chief Complaint: Sciatica   History of Present Illness:  Molly Norton is a 46 y.o. pleasant patient who presents with the following:  Postlaminectomy pain syndrome under care of neurologist.  Has breakthrough pain today and spent the day completing administrative tasks to get authorization to come in for toradol injection.  Is forced by the pain to use a walker currently.  Just released to work two hours a day Monday.  Patient Active Problem List  Diagnosis  . Postlaminectomy syndrome, lumbar region  . Chronic pain syndrome  . Spasm of muscle    Past Medical History  Diagnosis Date  . DDD (degenerative disc disease)     at L4-5 and L5-S1  . Chronic lower back pain     Past Surgical History  Procedure Date  . Nasal sinus surgery   . Cesarean section 1990  . Abdominal hysterectomy   . Laparoscopic ovarian cystectomy     post cyst rupture  . Anterior lumbar fusion 12/19/2010    L4-5 and L5-S1    History  Substance Use Topics  . Smoking status: Never Smoker   . Smokeless tobacco: Never Used  . Alcohol Use: No    Family History  Problem Relation Age of Onset  . Hypertension Mother   . Heart disease Father     Allergies  Allergen Reactions  . Adhesive (Tape)     Steri-strips caused blisters  . Latex Itching and Rash  . Lisinopril     Difficult swallowing  . Relafen (Nabumetone) Swelling  . Codeine Itching    Medication list has been reviewed and updated.  Current Outpatient Prescriptions on File Prior to Visit  Medication Sig Dispense Refill  . baclofen (LIORESAL) 20 MG tablet Take 20 mg by mouth 4 (four) times daily.      . baclofen (LIORESAL) 20 MG tablet Take 20 mg by mouth 4 (four) times daily.      . bisacodyl (DULCOLAX) 10 MG suppository Place 10 mg rectally as needed.        .  methocarbamol (ROBAXIN) 500 MG tablet Take 500 mg by mouth 4 (four) times daily.        [name] oxymorphone (OPANA ER) 20 MG 12 hr tablet Take 1 tablet (20 mg total) by mouth every 12 (twelve) hours.  60 tablet  0  . tiZANidine (ZANAFLEX) 4 MG capsule Take 4 mg by mouth 3 (three) times daily.      [name] b complex vitamins tablet Take 1 tablet by mouth daily.        [name] BIOTIN PO Take 4 mg by mouth daily.        . cyclobenzaprine (FLEXERIL) 10 MG tablet Take 10 mg by mouth at bedtime.        . fesoterodine (TOVIAZ) 4 MG TB24 Take 4 mg by mouth daily.      . furosemide (LASIX) 40 MG tablet Take 40 mg by mouth 3 (three) times daily.        . Incontinence Supply Disposable (POISE MAXIMUM ABSORBENCY) PADS USE UP TO 25/DAY OR AS NEEDED  741 each  11  . Multiple Vitamin (MULTIVITAMIN PO) Take 1 capsule by mouth daily.        . Potassium (POTASSIMIN PO) Take by mouth.        [name]  senna (SENOKOT) 8.6 MG tablet Take 1 tablet by mouth daily.        . silver sulfADIAZINE (SILVADENE) 1 % cream Apply topically daily.  50 g  0  . simethicone (MYLICON) 80 MG chewable tablet Chew 80 mg by mouth every 6 (six) hours as needed.        [name] DISCONTD: DULoxetine (CYMBALTA) 30 MG capsule Take 30 mg by mouth daily.        Review of Systems:  As per HPI, otherwise negative.    Physical Examination: Filed Vitals:   01/17/12 1658  BP: 188/106  Pulse: 87  Temp: 98.4 F (36.9 C)  Resp: 16   Filed Vitals:   01/17/12 1658  Height: 5' 7.5" (1.715 m)  Weight: 205 lb (92.987 kg)   Body mass index is 31.63 kg/(m^2). Ideal Body Weight: Weight in (lb) to have BMI = 25: 161.7    GEN: WDWN, NAD, Non-toxic, Alert & Oriented x 3 HEENT: Atraumatic, Normocephalic.  Ears and Nose: No external deformity. EXTR: No clubbing/cyanosis/edema NEURO: Antalgic gait requiring walker.[name]  PSYCH: Normally interactive. Conversant. Not depressed or anxious appearing.  Calm demeanor.    Assessment and Plan: Chronic post laminectomy pain Follow  up with regular doctor Monday   [name], MD

## 2012-01-19 ENCOUNTER — Telehealth: Payer: Self-pay | Admitting: Physical Medicine & Rehabilitation

## 2012-01-19 MED ORDER — OXYMORPHONE HCL ER 20 MG PO TB12: 20.0000 mg | ORAL_TABLET | Freq: Two times a day (BID) | ORAL | Status: DC

## 2012-01-19 NOTE — Telephone Encounter (Signed)
Would not repeat toradol injection since she just had one

## 2012-01-19 NOTE — Telephone Encounter (Signed)
Needs refill on Opana.  Moved Botox appt to 01/22/12.

## 2012-01-19 NOTE — Telephone Encounter (Signed)
Patient is in excruciating pain.  Wants Toradol injection.  Even though we have moved Botox up to 01/22/12, she is still insisting on Toradol.  It can take up to 3 days for auth on a Toradol injection.  Have already started authorization process Toradol through [employer].  Not sure what she is wanting, Botox is already approved.  Seen by PCP on 01/17/12 and given 60 mg Toradol.  Please advise.

## 2012-01-19 NOTE — Telephone Encounter (Signed)
Rx is ready to be picked up, pt aware. 

## 2012-01-19 NOTE — Telephone Encounter (Signed)
Printed for Karen to sign 

## 2012-01-20 ENCOUNTER — Telehealth: Payer: Self-pay | Admitting: *Deleted

## 2012-01-20 NOTE — Telephone Encounter (Signed)
Notified Molly Norton of Dr Wynn Banker decision about the Toradol injection.  She replied she is still in pain but will see him on Thursday 01/22/12.

## 2012-01-21 ENCOUNTER — Observation Stay (HOSPITAL_COMMUNITY)
Admission: EM | Admit: 2012-01-21 | Discharge: 2012-01-22 | Disposition: A | Discharge status: Home / Self Care | Payer: Federal, State, Local not specified - PPO | Attending: Emergency Medicine | Admitting: Emergency Medicine

## 2012-01-21 ENCOUNTER — Encounter (HOSPITAL_COMMUNITY): Payer: Self-pay | Admitting: Nurse Practitioner

## 2012-01-21 DIAGNOSIS — G8929 Other chronic pain: Principal | ICD-10-CM | POA: Insufficient documentation

## 2012-01-21 DIAGNOSIS — M5137 Other intervertebral disc degeneration, lumbosacral region: Secondary | ICD-10-CM | POA: Insufficient documentation

## 2012-01-21 DIAGNOSIS — Z79899 Other long term (current) drug therapy: Secondary | ICD-10-CM | POA: Insufficient documentation

## 2012-01-21 DIAGNOSIS — M51379 Other intervertebral disc degeneration, lumbosacral region without mention of lumbar back pain or lower extremity pain: Secondary | ICD-10-CM | POA: Insufficient documentation

## 2012-01-21 MED ORDER — DIAZEPAM 5 MG/ML IJ SOLN
5.0000 mg | Freq: Four times a day (QID) | INTRAMUSCULAR | Status: DC | PRN
  Administered 2012-01-22: 5 mg via INTRAVENOUS
  Filled 2012-01-21 (×2): qty 2

## 2012-01-21 MED ORDER — HYDROMORPHONE HCL PF 1 MG/ML IJ SOLN
1.0000 mg | Freq: Once | INTRAMUSCULAR | Status: AC
  Administered 2012-01-21: 1 mg via INTRAVENOUS
  Filled 2012-01-21: qty 1

## 2012-01-21 MED ORDER — KETOROLAC TROMETHAMINE 30 MG/ML IJ SOLN
30.0000 mg | Freq: Four times a day (QID) | INTRAMUSCULAR | Status: DC | PRN
  Administered 2012-01-22 (×2): 30 mg via INTRAVENOUS
  Filled 2012-01-21 (×2): qty 1

## 2012-01-21 MED ORDER — HYDROMORPHONE HCL PF 1 MG/ML IJ SOLN
1.0000 mg | INTRAMUSCULAR | Status: DC | PRN
  Administered 2012-01-22 (×4): 1 mg via INTRAVENOUS
  Filled 2012-01-21 (×4): qty 1

## 2012-01-21 MED ORDER — KETOROLAC TROMETHAMINE 30 MG/ML IJ SOLN
30.0000 mg | Freq: Once | INTRAMUSCULAR | Status: AC
  Administered 2012-01-21: 30 mg via INTRAVENOUS
  Filled 2012-01-21: qty 1

## 2012-01-21 NOTE — ED Notes (Signed)
Per EMS Pt c/o back pain radiating to Right leg. Has had Fentanyl . Pt has bilateral swelling in lower extremities.

## 2012-01-21 NOTE — ED Notes (Signed)
Changed pts linens and provided pts visitor with beverage.

## 2012-01-21 NOTE — ED Notes (Addendum)
Charting done by RN at [phone] was done by Sharyon Cable, RN. Error in logging out of EPIC

## 2012-01-21 NOTE — ED Provider Notes (Addendum)
History     CSN: [id number]  Arrival date & time 01/21/12  4098   First MD Initiated Contact with Patient 01/21/12 1945      Chief Complaint  Patient presents with  . Back Pain    (Consider location/radiation/quality/duration/timing/severity/associated sxs/prior treatment) HPI Pt with history of chronic back pain, managed by Pain Clinic, takes Opana and muscle relaxers states she has had 2 days of increasing pain in L lower back and radiating down her L leg (not R as documented in nurse note). She denies any urinary symptoms. States fentanyl given by EMS helped some. No new injuries or falls.   Past Medical History  Diagnosis Date  . DDD (degenerative disc disease)     at L4-5 and L5-S1  . Chronic lower back pain     Past Surgical History  Procedure Date  . Nasal sinus surgery   . Cesarean section 1990  . Abdominal hysterectomy   . Laparoscopic ovarian cystectomy     post cyst rupture  . Anterior lumbar fusion 12/19/2010    L4-5 and L5-S1    Family History  Problem Relation Age of Onset  . Hypertension Mother   . Heart disease Father     History  Substance Use Topics  . Smoking status: Never Smoker   . Smokeless tobacco: Never Used  . Alcohol Use: No    OB History    Grav Para Term Preterm Abortions TAB SAB Ect Mult Living                  Review of Systems All other systems reviewed and are negative except as noted in HPI.   Allergies  Adhesive; Latex; Lisinopril; Relafen; Codeine; and Robaxin  Home Medications   Current Outpatient Rx  Name Route Sig Dispense Refill  . BACLOFEN 20 MG PO TABS Oral Take 20 mg by mouth 6 (six) times daily.     . FESOTERODINE FUMARATE ER 4 MG PO TB24 Oral Take 4 mg by mouth daily.    [name] OXYMORPHONE HCL ER 20 MG PO TB12 Oral Take 1 tablet (20 mg total) by mouth every 12 (twelve) hours. 60 tablet 0  . TIZANIDINE HCL 4 MG PO CAPS Oral Take 4 mg by mouth 3 (three) times daily.      BP 133/91  Pulse 99  Temp 98.3 F  (36.8 C) (Oral)  SpO2 99%  Physical Exam  Nursing note and vitals reviewed. Constitutional: She is oriented to person, place, and time. She appears well-developed and well-nourished.  HENT:  Head: Normocephalic and atraumatic.  Eyes: EOM are normal. Pupils are equal, round, and reactive to light.  Neck: Normal range of motion. Neck supple.  Cardiovascular: Normal rate, normal heart sounds and intact distal pulses.   Pulmonary/Chest: Effort normal and breath sounds normal.  Abdominal: Bowel sounds are normal. She exhibits no distension. There is no tenderness.  Musculoskeletal: Normal range of motion. She exhibits tenderness. She exhibits no edema.       Back:  Neurological: She is alert and oriented to person, place, and time. She has normal strength. No cranial nerve deficit or sensory deficit.  Skin: Skin is warm and dry. No rash noted.  Psychiatric: She has a normal mood and affect.    ED Course  Procedures (including critical care time)  Labs Reviewed - No data to display No results found.   1. Chronic back pain       MDM  Pain improved with IV meds here. Will  ambulate. She has appointment at Pain Clinic tomorrow morning.    11:43 PM Pt unable to ambulate. Will place on CDU Protocol.     Charles B. Bernette Mayers, MD 01/21/12 [phone]

## 2012-01-21 NOTE — ED Notes (Signed)
Pt. C/o of 10/10 pain in left lower extremity starting at lower back. Pt. States having back surgery last year with several complications. Pt. Went to work Wednesday for the first time and states that "being on her feet all day put pressure on her back" Pt. Stated that pain has been increasingly worse and today is intolerable. Pt is unable to stand.

## 2012-01-21 NOTE — ED Notes (Signed)
MD at bedside. 

## 2012-01-22 ENCOUNTER — Encounter: Payer: Self-pay | Admitting: Physical Medicine & Rehabilitation

## 2012-01-22 ENCOUNTER — Ambulatory Visit (HOSPITAL_BASED_OUTPATIENT_CLINIC_OR_DEPARTMENT_OTHER): Admitting: Physical Medicine & Rehabilitation

## 2012-01-22 ENCOUNTER — Encounter: Payer: Federal, State, Local not specified - PPO | Attending: Physical Medicine & Rehabilitation

## 2012-01-22 VITALS — BP 162/79 | HR 80 | Resp 18 | Ht 67.5 in | Wt 209.4 lb

## 2012-01-22 DIAGNOSIS — M961 Postlaminectomy syndrome, not elsewhere classified: Secondary | ICD-10-CM | POA: Insufficient documentation

## 2012-01-22 DIAGNOSIS — M79609 Pain in unspecified limb: Secondary | ICD-10-CM | POA: Insufficient documentation

## 2012-01-22 DIAGNOSIS — M62838 Other muscle spasm: Secondary | ICD-10-CM

## 2012-01-22 DIAGNOSIS — G8929 Other chronic pain: Secondary | ICD-10-CM | POA: Insufficient documentation

## 2012-01-22 DIAGNOSIS — IMO0001 Reserved for inherently not codable concepts without codable children: Secondary | ICD-10-CM | POA: Insufficient documentation

## 2012-01-22 MED ORDER — OXYCODONE HCL 5 MG PO TABS: 5.0000 mg | ORAL_TABLET | Freq: Three times a day (TID) | ORAL | Status: AC | PRN

## 2012-01-22 MED ORDER — IBUPROFEN 800 MG PO TABS: 800.0000 mg | ORAL_TABLET | Freq: Three times a day (TID) | ORAL | Status: AC | PRN

## 2012-01-22 NOTE — ED Provider Notes (Signed)
Medical screening examination/treatment/procedure(s) were performed by non-physician practitioner and as supervising physician I was immediately available for consultation/collaboration.   Gwyneth Sprout, MD 01/22/12 1000

## 2012-01-22 NOTE — ED Notes (Signed)
Patient tearful. States she hasn't slept for the past couple of days. Complaining of spasms in her back radiating down left leg.

## 2012-01-22 NOTE — ED Notes (Signed)
Pt.  Very tearful having severe back spasms, pt. Medicated per orders and will reevaluate pt. At 09:30.. Reported to [place], [place]

## 2012-01-22 NOTE — Patient Instructions (Signed)
Please see her orthopedic spine surgeon for followup within the next couple weeks.he will need to address your restrictions as well as evaluate your increased pain Take ibuprofen 800 mg 3 times a day with food for the next week He may take oxycodone up to 3 times per day in addition to your Opana for the next 5 days

## 2012-01-22 NOTE — Progress Notes (Signed)
Chaplain delivered Bible per patient/family request.

## 2012-01-22 NOTE — ED Notes (Signed)
Pt. Eating breakfast. Husband arrived

## 2012-01-22 NOTE — Progress Notes (Signed)
  Subjective:    Patient ID: Molly Norton, female    DOB: 1965/06/14, 47 y.o.   MRN: [id number]  HPI  Patient indicates that she tried going back to work under the direction of her orthopedic surgeon for 2 hours per day. She is allowed constant standing and walking. She states the first day was not difficult however the second day she had some problems with standing and walking to the point where she did not go back to work the third day because of a flareup of pain. She went to an urgent care on 01/17/2012 no acute issues were seen. She also went to the emergency department 01/19/2012 and no acute issues were seen. X-rays were taken in the emergency department showing no hardware complication. She describes her pain is in the left buttocks area. She also has intermittent spasms. She has some left leg pain and feels like she needs to drag it a bit more.  Review of Systems  Gastrointestinal:       No incontinence  Genitourinary: Negative for decreased urine volume.       Objective:   Physical Exam  Constitutional: She appears well-developed and well-nourished.  Musculoskeletal:       Lumbar back: She exhibits decreased range of motion and deformity. She exhibits no tenderness.       Healed midline as well as paracentral incisions  Neurological: She is alert.       Ambulates with a walker as she usually does.          Assessment & Plan:  1. Lumbar postlaminectomy syndrome with chronic pain resulting from a work injury several years ago. No new trauma. She has increased her activity rather abruptly. She has also been 4 months since her Botox injection and that certainly could have worn off by now. 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 I have instructed her to followup with her orthopedic surgeon to assess for any post operative type complication. Also he can address any work restrictions. He does at the [facility]  results 2.Will repeat Botox injection today. I'll see her back in about 2-3 weeks and decide whether she needs a sacroiliac injection Will add ibuprofen 800 3 times a day for one week We'll give 5 day supply of oxycodone which she can take in conjunction with her Opana

## 2012-01-22 NOTE — ED Provider Notes (Signed)
Assumed patient care in CDU. 46 year old female with history of chronic back pain currently on back pain protocol. Patient has an appointment with pain clinic this a.m.  patient reports current pain medication regimen has helped but has not relief the pain completely. Reports she is able to ambulate with assistance.  Denies fever, chest pain, shortness of breath, abdominal pain, urinary symptoms, or saddle anesthesia. She will followup with her pain clinic appointment at 11 AM for Botox injection.  On exam, patient is alert and oriented and in no apparent distress. Heart regular rate and rhythm, no murmurs rubs. Lungs clear to auscultation bilaterally. Abdomen is soft and nontender. Point tenderness to left posterior buttock at the level of sacroiliac. Increasing pain with left hip flexion and extension. No overlying skin changes.  Plan to continue with back pain protocol until 10 AM, when husband will come to pick her up for her appointment.  9:10 AM Pt continues to endorse pain and muscle spasm to L leg despite receiving pain medication.  On exam, no palpable cords, erythema, negative homan sign.  PERC negative.  No evidence of DVT.  i anticipate this is acute flare sciatica. Will continue pain management, muscle relaxant given.    9:55 AM Pt will be discharge to go to her pain clinic appointment.  Husband by herside  Fayrene Helper, PA-C 01/22/12 [phone]

## 2012-01-22 NOTE — ED Notes (Signed)
Pt. oob to the bathroom and dressed her self for her MD appointment this am.   Pt. Moves slowly independently.  Continues to have muscle spasms, but the medication has helped.  Pain has decreased.

## 2012-01-22 NOTE — Progress Notes (Signed)
Botulinum toxin injection under needle EMG guidance Indication muscle spasm in the lumbar paraspinal muscles chronic and severe which does not respond to oral anti-spasticity medications or physical therapy Informed consent was obtained after describing risks and benefits of the procedure with the patient these include bleeding bruising and infection she elects to proceed and has given written consent patient placed in a forward flexed posture. There is adjacent to L4-L5 and S1 marked and prepped with betadine and alcohol. Entered with a 25-gauge 50 mm needle electrode under needle EMG guidance 25 units of Botox injected into each of 8 sites which included the gluteus medius muscle. Post injection instructions given. The majority of muscle activity on EMG was noted at L4   

## 2012-02-10 ENCOUNTER — Encounter: Payer: Self-pay | Admitting: Physical Medicine & Rehabilitation

## 2012-02-10 ENCOUNTER — Ambulatory Visit: Payer: Self-pay | Admitting: Physical Medicine & Rehabilitation

## 2012-02-10 ENCOUNTER — Ambulatory Visit (HOSPITAL_BASED_OUTPATIENT_CLINIC_OR_DEPARTMENT_OTHER): Admitting: Physical Medicine & Rehabilitation

## 2012-02-10 ENCOUNTER — Encounter: Attending: Physical Medicine & Rehabilitation

## 2012-02-10 VITALS — BP 180/104 | HR 103 | Resp 14 | Ht 67.0 in | Wt 204.0 lb

## 2012-02-10 DIAGNOSIS — Z981 Arthrodesis status: Secondary | ICD-10-CM | POA: Insufficient documentation

## 2012-02-10 DIAGNOSIS — R259 Unspecified abnormal involuntary movements: Secondary | ICD-10-CM | POA: Insufficient documentation

## 2012-02-10 DIAGNOSIS — M961 Postlaminectomy syndrome, not elsewhere classified: Secondary | ICD-10-CM | POA: Insufficient documentation

## 2012-02-10 DIAGNOSIS — G8929 Other chronic pain: Secondary | ICD-10-CM | POA: Insufficient documentation

## 2012-02-10 DIAGNOSIS — IMO0001 Reserved for inherently not codable concepts without codable children: Secondary | ICD-10-CM | POA: Insufficient documentation

## 2012-02-10 DIAGNOSIS — R269 Unspecified abnormalities of gait and mobility: Secondary | ICD-10-CM | POA: Insufficient documentation

## 2012-02-10 DIAGNOSIS — M51379 Other intervertebral disc degeneration, lumbosacral region without mention of lumbar back pain or lower extremity pain: Secondary | ICD-10-CM | POA: Insufficient documentation

## 2012-02-10 DIAGNOSIS — R5381 Other malaise: Secondary | ICD-10-CM | POA: Insufficient documentation

## 2012-02-10 DIAGNOSIS — R209 Unspecified disturbances of skin sensation: Secondary | ICD-10-CM | POA: Insufficient documentation

## 2012-02-10 DIAGNOSIS — M62838 Other muscle spasm: Secondary | ICD-10-CM | POA: Insufficient documentation

## 2012-02-10 DIAGNOSIS — M5137 Other intervertebral disc degeneration, lumbosacral region: Secondary | ICD-10-CM | POA: Insufficient documentation

## 2012-02-10 DIAGNOSIS — M533 Sacrococcygeal disorders, not elsewhere classified: Secondary | ICD-10-CM

## 2012-02-10 DIAGNOSIS — M79609 Pain in unspecified limb: Secondary | ICD-10-CM | POA: Insufficient documentation

## 2012-02-10 DIAGNOSIS — R32 Unspecified urinary incontinence: Secondary | ICD-10-CM | POA: Insufficient documentation

## 2012-02-10 DIAGNOSIS — R262 Difficulty in walking, not elsewhere classified: Secondary | ICD-10-CM | POA: Insufficient documentation

## 2012-02-10 MED ORDER — IBUPROFEN 800 MG PO TABS: 800.0000 mg | ORAL_TABLET | Freq: Three times a day (TID) | ORAL | Status: DC | PRN

## 2012-02-10 MED ORDER — ALPRAZOLAM 1 MG PO TABS: 1.0000 mg | ORAL_TABLET | Freq: Once | ORAL | Status: DC

## 2012-02-10 MED ORDER — ALPRAZOLAM 1 MG PO TABS: 1.0000 mg | ORAL_TABLET | Freq: Once | ORAL | Status: AC

## 2012-02-10 NOTE — Patient Instructions (Addendum)
Sacroiliac L injection Take xanax when you arrive

## 2012-02-10 NOTE — Progress Notes (Signed)
Subjective:    Patient ID: Molly Norton, female    DOB: 1965/07/06, 46 y.o.   MRN: [id number]  HPI Patient had good relief with the Botox. Her pain has localized to the left buttock area it goes down the posterior thigh but does not go beyond the knee. She's been evaluated by Dr. Jeanette Caprice who feels that it is likely a sacroiliac problem. No tingling or numbness in the leg. Pain Inventory Average Pain 5 Pain Right Now 7 My pain is constant, sharp, stabbing and aching  In the last 24 hours, has pain interfered with the following? General activity 10 Relation with others 10 Enjoyment of life 10 What TIME of day is your pain at its worst? all the time Sleep (in general) Poor  Pain is worse with: walking, bending, sitting and standing Pain improves with: rest Relief from Meds: 2  Mobility walk without assistance use a walker how many minutes can you walk? 15 ability to climb steps?  yes do you drive?  yes use a wheelchair transfers alone Do you have any goals in this area?  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  no  Physicians involved in your care Any changes since last visit?  no   Family History  Problem Relation Age of Onset  . Hypertension Mother   . Heart disease Father    History   Social History  . Marital Status: Single    Spouse Name: N/A    Number of Children: N/A  . Years of Education: N/A   Social History Main Topics  . Smoking status: Never Smoker   . Smokeless tobacco: Never Used  . Alcohol Use: No  . Drug Use: No  . Sexually Active: None   Other Topics Concern  . None   Social History Narrative  . None    Past Surgical History  Procedure Date  . Nasal sinus surgery   . Cesarean section 1990  . Abdominal hysterectomy   . Laparoscopic ovarian cystectomy     post cyst rupture  . Anterior lumbar fusion 12/19/2010    L4-5 and L5-S1   Past Medical History  Diagnosis Date  . DDD (degenerative disc disease)     at L4-5 and L5-S1  . Chronic lower back pain    BP 180/104  Pulse 103  Resp 14  Ht 5\' 7"  (1.702 m)  Wt 204 lb (92.534 kg)  BMI 31.95 kg/m2  SpO2 99%     Review of Systems  Musculoskeletal: Positive for myalgias, back pain, arthralgias and gait problem.  Neurological: Positive for weakness and numbness.  All other systems reviewed and are negative.       Objective:   Physical Exam  Constitutional: She is oriented to person, place, and time. She appears well-developed and well-nourished.  Musculoskeletal:       Surgical scars in the lumbar spine 1 midline to paraspinal well healed  Left PSIS tenderness.  Neurological: She is alert and oriented to person, place, and time. She has normal strength. No sensory deficit. Gait abnormal. Coordination normal.       Ambulation is/  Psychiatric: She has a normal mood and affect.          Assessment &  Plan:  1. Lumbar postlaminectomy syndrome with chronic pain Will continue Opana 2. Sacroiliac disorder left will schedule for left sacroiliac injection. May use ibuprofen to help with this as well.

## 2012-02-23 ENCOUNTER — Telehealth: Payer: Self-pay | Admitting: Physical Medicine & Rehabilitation

## 2012-02-23 MED ORDER — OXYMORPHONE HCL ER 20 MG PO TB12: 20.0000 mg | ORAL_TABLET | Freq: Two times a day (BID) | ORAL | Status: DC

## 2012-02-23 NOTE — Telephone Encounter (Signed)
Needs refill on Opana

## 2012-02-23 NOTE — Telephone Encounter (Signed)
Pt is aware that her prescription is ready.   She is inquiring about if the SI injection has been approved.

## 2012-02-23 NOTE — Telephone Encounter (Signed)
Printed for Karen to sign 

## 2012-02-24 ENCOUNTER — Telehealth: Payer: Self-pay | Admitting: Physical Medicine & Rehabilitation

## 2012-02-24 NOTE — Telephone Encounter (Signed)
FYI - authorization request for procedure initiated today.  Patient needs work note to return to work stating that we are waiting on WC autjhorization.

## 2012-02-25 NOTE — Telephone Encounter (Signed)
Spoke with Vernona Rieger at Dr [facility] office and requested last note.

## 2012-02-25 NOTE — Telephone Encounter (Signed)
Please get Dr.Dumonski's last note which addresses work status for my review tomorrow

## 2012-02-25 NOTE — Telephone Encounter (Signed)
Please advise if patient is able to go back to work with what restrictions.

## 2012-03-01 NOTE — Telephone Encounter (Signed)
Letter mailed to patient.

## 2012-03-19 ENCOUNTER — Encounter: Payer: Self-pay | Admitting: Physical Medicine & Rehabilitation

## 2012-03-19 ENCOUNTER — Ambulatory Visit (HOSPITAL_BASED_OUTPATIENT_CLINIC_OR_DEPARTMENT_OTHER): Admitting: Physical Medicine & Rehabilitation

## 2012-03-19 ENCOUNTER — Encounter: Attending: Physical Medicine & Rehabilitation

## 2012-03-19 VITALS — BP 153/93 | HR 80 | Resp 16 | Ht 67.0 in | Wt 197.0 lb

## 2012-03-19 DIAGNOSIS — R262 Difficulty in walking, not elsewhere classified: Secondary | ICD-10-CM | POA: Insufficient documentation

## 2012-03-19 DIAGNOSIS — M5137 Other intervertebral disc degeneration, lumbosacral region: Secondary | ICD-10-CM | POA: Insufficient documentation

## 2012-03-19 DIAGNOSIS — Z981 Arthrodesis status: Secondary | ICD-10-CM | POA: Insufficient documentation

## 2012-03-19 DIAGNOSIS — R209 Unspecified disturbances of skin sensation: Secondary | ICD-10-CM | POA: Insufficient documentation

## 2012-03-19 DIAGNOSIS — M62838 Other muscle spasm: Secondary | ICD-10-CM | POA: Insufficient documentation

## 2012-03-19 DIAGNOSIS — R259 Unspecified abnormal involuntary movements: Secondary | ICD-10-CM | POA: Insufficient documentation

## 2012-03-19 DIAGNOSIS — M533 Sacrococcygeal disorders, not elsewhere classified: Secondary | ICD-10-CM

## 2012-03-19 DIAGNOSIS — R269 Unspecified abnormalities of gait and mobility: Secondary | ICD-10-CM | POA: Insufficient documentation

## 2012-03-19 DIAGNOSIS — M79609 Pain in unspecified limb: Secondary | ICD-10-CM | POA: Insufficient documentation

## 2012-03-19 DIAGNOSIS — M961 Postlaminectomy syndrome, not elsewhere classified: Secondary | ICD-10-CM | POA: Insufficient documentation

## 2012-03-19 DIAGNOSIS — G8929 Other chronic pain: Secondary | ICD-10-CM | POA: Insufficient documentation

## 2012-03-19 DIAGNOSIS — M5380 Other specified dorsopathies, site unspecified: Secondary | ICD-10-CM | POA: Insufficient documentation

## 2012-03-19 DIAGNOSIS — M545 Low back pain, unspecified: Secondary | ICD-10-CM | POA: Insufficient documentation

## 2012-03-19 DIAGNOSIS — R32 Unspecified urinary incontinence: Secondary | ICD-10-CM | POA: Insufficient documentation

## 2012-03-19 DIAGNOSIS — M51379 Other intervertebral disc degeneration, lumbosacral region without mention of lumbar back pain or lower extremity pain: Secondary | ICD-10-CM | POA: Insufficient documentation

## 2012-03-19 DIAGNOSIS — IMO0001 Reserved for inherently not codable concepts without codable children: Secondary | ICD-10-CM | POA: Insufficient documentation

## 2012-03-19 DIAGNOSIS — R5381 Other malaise: Secondary | ICD-10-CM | POA: Insufficient documentation

## 2012-03-19 MED ORDER — OXYMORPHONE HCL ER 20 MG PO TB12: 20.0000 mg | ORAL_TABLET | Freq: Two times a day (BID) | ORAL | Status: DC

## 2012-03-19 MED ORDER — TIZANIDINE HCL 4 MG PO CAPS: 4.0000 mg | ORAL_CAPSULE | Freq: Three times a day (TID) | ORAL | Status: DC

## 2012-03-19 NOTE — Progress Notes (Signed)
  PROCEDURE RECORD [facility]   Name: Molly Norton DOB:02/21/1966 MRN: [id number]  Date:03/19/2012  Physician: Claudette Laws, MD    Nurse/CMA: Redgie Grayer  Allergies:  Allergies  Allergen Reactions  . Adhesive (Tape)     Steri-strips caused blisters  . Latex Itching and Rash  . Lisinopril     Difficult swallowing  . Relafen (Nabumetone) Swelling  . Codeine Itching  . Robaxin (Methocarbamol) Other (See Comments)    Hospital gave it to her she is unsure of reaction    Consent Signed: yes  Is patient diabetic? no   Pregnant: no LMP: No LMP recorded. Patient has had a hysterectomy. (age 64-55)  Anticoagulants: no Anti-inflammatory: no Antibiotics: no  Procedure: SI Injection  Position: Prone Start Time: 9:22am  End Time: 9:29am  Fluoro Time: 19 seconds  RN/CMA Carroll,CMA Carroll,CMA    Time 9:02am 9:32am    BP 153/93 156/98    Pulse 80 87    Respirations 16 16    O2 Sat 98% 97%    S/S 6 6    Pain Level 3-4/10 4/10     D/C home with husband, patient A & O X 3, D/C instructions reviewed, and sits independently.

## 2012-03-19 NOTE — Progress Notes (Signed)
Left sacroiliac injection under fluoroscopic guidance  Indication: Left Low back and buttocks pain not relieved by medication management and other conservative care.  Informed consent was obtained after describing risks and benefits of the procedure with the patient, this includes bleeding, bruising, infection, paralysis and medication side effects. The patient wishes to proceed and has given written consent. The patient was placed in a prone position. The lumbar and sacral area was marked and prepped with Betadine. A 25-gauge 1-1/2 inch needle was inserted into the skin and subcutaneous tissue and 1 mL of 1% lidocaine was injected. Then a 25-gauge 3.5 inch spinal needle was inserted under fluoroscopic guidance into the left sacroiliac joint. AP and lateral images were utilized. Omnipaque 180x0.5 mL under live fluoroscopy demonstrated no intravascular uptake. Then a solution containing one ML of 40 mg per mL depomedrol and 2 ML of 1% lidocaine MPF was injected x1.5 mL. Patient tolerated the procedure well. Post procedure instructions were given. Please see post procedure form. 

## 2012-03-19 NOTE — Patient Instructions (Signed)
You had a left sacroiliac injection today I injected Depo-Medrol as well as 2% lidocaine. Everything went fine Please monitor your pain level comparing it from this morning to this evening. We'll discuss the efficacy of this injection next month. You have a written prescription for the Opana You have an E. Prescription for the Zanaflex

## 2012-03-20 ENCOUNTER — Other Ambulatory Visit: Payer: Self-pay | Admitting: Physical Medicine & Rehabilitation

## 2012-03-22 ENCOUNTER — Ambulatory Visit: Payer: Self-pay | Admitting: Physical Medicine & Rehabilitation

## 2012-04-13 ENCOUNTER — Encounter: Payer: Self-pay | Admitting: Physical Medicine & Rehabilitation

## 2012-04-13 ENCOUNTER — Encounter: Attending: Physical Medicine & Rehabilitation

## 2012-04-13 ENCOUNTER — Ambulatory Visit (HOSPITAL_BASED_OUTPATIENT_CLINIC_OR_DEPARTMENT_OTHER): Admitting: Physical Medicine & Rehabilitation

## 2012-04-13 VITALS — BP 179/105 | HR 74 | Ht 67.0 in | Wt 201.0 lb

## 2012-04-13 DIAGNOSIS — Z5181 Encounter for therapeutic drug level monitoring: Secondary | ICD-10-CM

## 2012-04-13 DIAGNOSIS — M549 Dorsalgia, unspecified: Secondary | ICD-10-CM

## 2012-04-13 DIAGNOSIS — G8929 Other chronic pain: Secondary | ICD-10-CM | POA: Insufficient documentation

## 2012-04-13 DIAGNOSIS — M545 Low back pain, unspecified: Secondary | ICD-10-CM | POA: Insufficient documentation

## 2012-04-13 DIAGNOSIS — T40605A Adverse effect of unspecified narcotics, initial encounter: Secondary | ICD-10-CM | POA: Insufficient documentation

## 2012-04-13 DIAGNOSIS — M5380 Other specified dorsopathies, site unspecified: Secondary | ICD-10-CM | POA: Insufficient documentation

## 2012-04-13 DIAGNOSIS — M961 Postlaminectomy syndrome, not elsewhere classified: Secondary | ICD-10-CM | POA: Insufficient documentation

## 2012-04-13 DIAGNOSIS — Z981 Arthrodesis status: Secondary | ICD-10-CM | POA: Insufficient documentation

## 2012-04-13 DIAGNOSIS — N319 Neuromuscular dysfunction of bladder, unspecified: Secondary | ICD-10-CM | POA: Insufficient documentation

## 2012-04-13 DIAGNOSIS — M62838 Other muscle spasm: Secondary | ICD-10-CM

## 2012-04-13 DIAGNOSIS — I10 Essential (primary) hypertension: Secondary | ICD-10-CM | POA: Insufficient documentation

## 2012-04-13 DIAGNOSIS — K5909 Other constipation: Secondary | ICD-10-CM | POA: Insufficient documentation

## 2012-04-13 MED ORDER — ONABOTULINUMTOXINA (COSMETIC) 100 UNITS IM SOLR: 200.0000 [IU] | Freq: Once | INTRAMUSCULAR | Status: DC

## 2012-04-13 MED ORDER — OXYMORPHONE HCL ER 20 MG PO TB12: 20.0000 mg | ORAL_TABLET | Freq: Two times a day (BID) | ORAL | Status: DC

## 2012-04-13 MED ORDER — POLYETHYLENE GLYCOL 3350 17 GM/SCOOP PO POWD: 17.0000 g | Freq: Two times a day (BID) | ORAL | Status: DC

## 2012-04-13 NOTE — Patient Instructions (Signed)
You'll need to get your blood pressure checked at urgent care or go to your primary care today. Followup with urology for your bladder problems

## 2012-04-13 NOTE — Progress Notes (Signed)
Subjective:    Patient ID: Molly Norton, female    DOB: [birth year], 46 y.o.   MRN: [id number] Molly Norton is a 46 year old female work-related back injury, who failed  conservative care for over a year. She underwent L4-5 and L5-S1 fusion,  anterior and posterior in July 2012, and was on the Inpatient Rehab Unit  from July 26 through January 20, 2011. HPI Patient returns after left sacroiliac injection performed on 03/19/2012.Helpful for left-sided buttocks pain Last Botox injection to the paraspinal muscles at L4-L5-S1 was performed on 01/22/2012. This was Effective for low back spasms Pain Inventory Average Pain 7 Pain Right Now 2 My pain is constant, sharp, burning, stabbing and aching  In the last 24 hours, has pain interfered with the following? General activity 3 Relation with others 3 Enjoyment of life 3 What TIME of day is your pain at its worst? daytime and evening Sleep (in general) Poor  Pain is worse with: walking, sitting, inactivity, standing and some activites Pain improves with: rest, heat/ice, therapy/exercise, pacing activities, medication and injections Relief from Meds: 6  Mobility walk without assistance how many minutes can you walk? 20 ability to climb steps?  yes do you drive?  yes  Function not employed: date last employed 2010 I need assistance with the following:  meal prep, household duties and shopping  Neuro/Psych bladder control problems bowel control problems numbness trouble walking spasms  Prior Studies Any changes since last visit?  no  Physicians involved in your care Any changes since last visit?  no   Family History  Problem Relation Age of Onset  . Hypertension Mother   . Heart disease Father    History   Social History  . Marital Status: Single    Spouse Name: N/A    Number of Children: N/A  . Years of Education: N/A   Social History Main Topics  . Smoking status: Never Smoker   . Smokeless tobacco: Never Used   . Alcohol Use: No  . Drug Use: No  . Sexually Active: None   Other Topics Concern  . None   Social History Narrative  . None   Past Surgical History  Procedure Date  . Nasal sinus surgery   . Cesarean section 1990  . Abdominal hysterectomy   . Laparoscopic ovarian cystectomy     post cyst rupture  . Anterior lumbar fusion 12/19/2010    L4-5 and L5-S1   Past Medical History  Diagnosis Date  . DDD (degenerative disc disease)     at L4-5 and L5-S1  . Chronic lower back pain    BP 179/105  Pulse 74  Ht 5\' 7"  (1.702 m)  Wt 201 lb (91.173 kg)  BMI 31.48 kg/m2  SpO2 97%    Review of Systems  Constitutional: Positive for unexpected weight change.  Gastrointestinal: Positive for constipation.  Musculoskeletal: Positive for back pain and gait problem.       Spasms  Neurological: Positive for numbness.  All other systems reviewed and are negative.       Objective:   Physical Exam  Constitutional: She is oriented to person, place, and time. She appears well-developed and well-nourished.  HENT:  Head: Normocephalic and atraumatic.  Eyes: Conjunctivae normal and EOM are normal. Pupils are equal, round, and reactive to light.  Neck: Normal range of motion.  Musculoskeletal:       Lumbar back: She exhibits decreased range of motion, tenderness and pain.       Pain with extension.  Does not extend beyond neutral Forward flexion about 25% of normal   Neurological: She is alert and oriented to person, place, and time. She has normal strength. No sensory deficit. Gait abnormal. Coordination normal.       Gait is very slow with decreased arm swing  Psychiatric: She has a normal mood and affect. Her behavior is normal. Judgment and thought content normal.          Assessment & Plan:  1. Lumbar postlaminectomy syndrome. Chronic low back pain. No new findings on examination. Continue current pain medication oxymorphone extended release 20 twice a day 2. Chronic severe  back spasms on baclofen 20 mg 4 times per day Zanaflex 4 mg twice a day and botulinum toxin 200 units every 3 months. Will schedule for repeat botulinum toxin injection in 3 weeks. Continue Zanaflex and baclofen. 3. Opioid-induced constipation continue Senokot 2 tablets 4 times per day as well as add on to polyethylene glycol one or 2 times per day 17 g 4. Hypertension: Followup with primary care or urgent care today, discussed with patient agrees with plan

## 2012-04-13 NOTE — Progress Notes (Signed)
Patient returns after left sacroiliac injection performed on 03/19/2012. Last Botox injection to the paraspinal muscles at L4-L5-S1 was performed on 01/22/2012

## 2012-05-04 ENCOUNTER — Ambulatory Visit (HOSPITAL_BASED_OUTPATIENT_CLINIC_OR_DEPARTMENT_OTHER): Admitting: Physical Medicine & Rehabilitation

## 2012-05-04 ENCOUNTER — Encounter: Payer: Self-pay | Admitting: Physical Medicine & Rehabilitation

## 2012-05-04 ENCOUNTER — Encounter

## 2012-05-04 VITALS — BP 129/76 | HR 71 | Resp 14 | Ht 67.5 in | Wt 200.0 lb

## 2012-05-04 DIAGNOSIS — M62838 Other muscle spasm: Secondary | ICD-10-CM

## 2012-05-04 MED ORDER — SENNA 8.6 MG PO TABS: 2.0000 | ORAL_TABLET | Freq: Every day | ORAL | Status: DC | PRN

## 2012-05-04 MED ORDER — OXYMORPHONE HCL ER 20 MG PO TB12: 20.0000 mg | ORAL_TABLET | Freq: Two times a day (BID) | ORAL | Status: DC

## 2012-05-04 NOTE — Patient Instructions (Signed)
Medication will take 7 days to become effective Return to clinic 5 weeks

## 2012-05-04 NOTE — Progress Notes (Signed)
Botulinum toxin injection under needle EMG guidance Indication muscle spasm in the lumbar paraspinal muscles chronic and severe which does not respond to oral anti-spasticity medications or physical therapy Informed consent was obtained after describing risks and benefits of the procedure with the patient these include bleeding bruising and infection she elects to proceed and has given written consent patient placed in a forward flexed posture. There is adjacent to L4-L5 and S1 marked and prepped with betadine and alcohol. Entered with a 25-gauge 50 mm needle electrode under needle EMG guidance 25 units of Botox injected into each of 8 sites which included the gluteus medius muscle. Post injection instructions given. The majority of muscle activity on EMG was noted at L4   

## 2012-05-31 DIAGNOSIS — N319 Neuromuscular dysfunction of bladder, unspecified: Secondary | ICD-10-CM | POA: Insufficient documentation

## 2012-06-14 ENCOUNTER — Encounter

## 2012-06-14 ENCOUNTER — Ambulatory Visit: Admitting: Physical Medicine & Rehabilitation

## 2012-06-18 ENCOUNTER — Encounter: Attending: Physical Medicine & Rehabilitation

## 2012-06-18 ENCOUNTER — Ambulatory Visit (HOSPITAL_BASED_OUTPATIENT_CLINIC_OR_DEPARTMENT_OTHER): Admitting: Physical Medicine & Rehabilitation

## 2012-06-18 ENCOUNTER — Encounter: Payer: Self-pay | Admitting: Physical Medicine & Rehabilitation

## 2012-06-18 VITALS — BP 158/92 | HR 89 | Resp 14 | Ht 67.0 in | Wt 204.4 lb

## 2012-06-18 DIAGNOSIS — M961 Postlaminectomy syndrome, not elsewhere classified: Secondary | ICD-10-CM

## 2012-06-18 DIAGNOSIS — M545 Low back pain, unspecified: Secondary | ICD-10-CM | POA: Insufficient documentation

## 2012-06-18 DIAGNOSIS — T40605A Adverse effect of unspecified narcotics, initial encounter: Secondary | ICD-10-CM | POA: Insufficient documentation

## 2012-06-18 DIAGNOSIS — Z981 Arthrodesis status: Secondary | ICD-10-CM | POA: Insufficient documentation

## 2012-06-18 DIAGNOSIS — I10 Essential (primary) hypertension: Secondary | ICD-10-CM | POA: Insufficient documentation

## 2012-06-18 DIAGNOSIS — G8929 Other chronic pain: Secondary | ICD-10-CM | POA: Insufficient documentation

## 2012-06-18 DIAGNOSIS — K5909 Other constipation: Secondary | ICD-10-CM | POA: Insufficient documentation

## 2012-06-18 DIAGNOSIS — M5380 Other specified dorsopathies, site unspecified: Secondary | ICD-10-CM | POA: Insufficient documentation

## 2012-06-18 DIAGNOSIS — M533 Sacrococcygeal disorders, not elsewhere classified: Secondary | ICD-10-CM

## 2012-06-18 MED ORDER — OXYMORPHONE HCL ER 10 MG PO TB12: 10.0000 mg | ORAL_TABLET | Freq: Two times a day (BID) | ORAL | Status: DC

## 2012-06-18 NOTE — Progress Notes (Signed)
Subjective:    Patient ID: Molly Norton, female    DOB: 10/25/1965, 47 y.o.   MRN: [id number]  HPI Left buttocks pain worse again starting mid December.  Last SI injection on 03/04/12.  Last botox injection 05/04/12. Out of Opana since 06/15/2012 no withdrawal symptoms. Increased pain last visit was 2/10 currently 8/10 Pain Inventory Average Pain 6 Pain Right Now 8 My pain is sharp, stabbing and aching  In the last 24 hours, has pain interfered with the following? General activity 8 Relation with others 7 Enjoyment of life 7 What TIME of day is your pain at its worst? all the time Sleep (in general) Poor  Pain is worse with: walking, sitting, standing and some activites Pain improves with: rest, heat/ice, therapy/exercise, pacing activities, medication and injections Relief from Meds: 3  Mobility walk without assistance walk with assistance use a cane use a walker how many minutes can you walk? 20 ability to climb steps?  no do you drive?  no use a wheelchair needs help with transfers Do you have any goals in this area?  yes  Function Do you have any goals in this area?  yes  Neuro/Psych bladder control problems bowel control problems numbness trouble walking spasms  Prior Studies Any changes since last visit?  no  Physicians involved in your care Any changes since last visit?  no   Family History  Problem Relation Age of Onset  . Hypertension Mother   . Heart disease Father    History   Social History  . Marital Status: Single    Spouse Name: N/A    Number of Children: N/A  . Years of Education: N/A   Social History Main Topics  . Smoking status: Never Smoker   . Smokeless tobacco: Never Used  . Alcohol Use: No  . Drug Use: No  . Sexually Active: None   Other Topics Concern  . None   Social History Narrative  . None   Past Surgical History  Procedure Date  . Nasal sinus surgery   . Cesarean section 1990  . Abdominal hysterectomy    . Laparoscopic ovarian cystectomy     post cyst rupture  . Anterior lumbar fusion 12/19/2010    L4-5 and L5-S1   Past Medical History  Diagnosis Date  . DDD (degenerative disc disease)     at L4-5 and L5-S1  . Chronic lower back pain    BP 158/92  Pulse 89  Resp 14  Ht 5\' 7"  (1.702 m)  Wt 204 lb 6.4 oz (92.715 kg)  BMI 32.01 kg/m2  SpO2 99%    Review of Systems  Gastrointestinal: Positive for constipation.  Musculoskeletal: Positive for back pain and gait problem.  Neurological: Positive for numbness.       Spasms   All other systems reviewed and are negative.       Objective:   Physical Exam Pain over left sacroiliac area. Straight leg raising test is negative Lower extremity motor strength is normal Deep tendon reflexes are normal Gait is antalgic Mood and affect are flat       Assessment & Plan:  1. Sacroiliac pain after fusion recommend repeat sacroiliac injection. Will need to get on a every 3 month schedule 2. Lumbar muscle spasm improved after Botox will need to repeat this in approximately 1-1/2 months and stay on a 3 month schedule 3.  Will restart Opana ER at 10 mg, patient is to call in 3 days if this dose  is not effective Continue Zanaflex,Baclofen, ibuprofen, Senokot and MiraLAX

## 2012-06-18 NOTE — Patient Instructions (Signed)
Please call workers comp for [employer] authorization for Left Sacroiliac injection

## 2012-06-22 ENCOUNTER — Telehealth: Payer: Self-pay

## 2012-06-22 NOTE — Telephone Encounter (Signed)
Patient advised to increase opana to 20mg  twice a day.  She will call when she gets low.

## 2012-06-22 NOTE — Telephone Encounter (Signed)
May take 2 tablets 20mg  twice a day.  Will be out about 2 weeks early.  Can pick up RX for Opana ER 20mg  #60 in about two weeks

## 2012-06-22 NOTE — Telephone Encounter (Signed)
Left message advising patient to call office regarding medication change.

## 2012-06-22 NOTE — Telephone Encounter (Signed)
Patient called to let us know that the opana 10mg  is not working.  Her pain level is 9/10.  Please advise.

## 2012-06-28 ENCOUNTER — Other Ambulatory Visit: Payer: Self-pay | Admitting: Physical Medicine & Rehabilitation

## 2012-07-01 ENCOUNTER — Encounter

## 2012-07-01 ENCOUNTER — Ambulatory Visit (HOSPITAL_BASED_OUTPATIENT_CLINIC_OR_DEPARTMENT_OTHER): Admitting: Physical Medicine & Rehabilitation

## 2012-07-01 ENCOUNTER — Encounter: Payer: Self-pay | Admitting: Physical Medicine & Rehabilitation

## 2012-07-01 VITALS — BP 130/88 | HR 79 | Resp 14 | Ht 67.5 in | Wt 206.2 lb

## 2012-07-01 DIAGNOSIS — M533 Sacrococcygeal disorders, not elsewhere classified: Secondary | ICD-10-CM

## 2012-07-01 MED ORDER — OXYMORPHONE HCL ER 20 MG PO TB12: 20.0000 mg | ORAL_TABLET | Freq: Two times a day (BID) | ORAL | Status: DC

## 2012-07-01 NOTE — Patient Instructions (Signed)
This injection may take up to a week to take full effect Your next injection will be in one month this will be Botox

## 2012-07-01 NOTE — Progress Notes (Signed)
  PROCEDURE RECORD [facility]   Name: Molly Norton DOB:05-13-1966 MRN: [id number]  Date:07/01/2012  Physician: Claudette Laws, MD    Nurse/CMA: Shumaker RN  Allergies:  Allergies  Allergen Reactions  . Adhesive (Tape)     Steri-strips caused blisters  . Latex Itching and Rash  . Lisinopril     Difficult swallowing  . Relafen (Nabumetone) Swelling  . Codeine Itching  . Robaxin (Methocarbamol) Other (See Comments)    Hospital gave it to her she is unsure of reaction    Consent Signed: yes  Is patient diabetic? no  CBG today?   Pregnant: no LMP: No LMP recorded. Patient has had a hysterectomy. (age 45-55)  Anticoagulants: no Anti-inflammatory: no Antibiotics: no  Procedure: left sacroiliac steroid injection Position: Prone Start Time: 9:58 End Time: 10:00 Fluoro Time: 8 seconds  RN/CMA Designer, multimedia    Time 9:15 10:03    BP 130/88 127/69    Pulse 79 91    Respirations 14 14    O2 Sat 99 94    S/S 6 6    Pain Level 6/10 6/10     D/C home with Bruce patient A & O X 3, D/C instructions reviewed, and sits independently.

## 2012-07-01 NOTE — Progress Notes (Signed)
Left sacroiliac injection under fluoroscopic guidance  Indication: Left Low back and buttocks pain not relieved by medication management and other conservative care.  Informed consent was obtained after describing risks and benefits of the procedure with the patient, this includes bleeding, bruising, infection, paralysis and medication side effects. The patient wishes to proceed and has given written consent. The patient was placed in a prone position. The lumbar and sacral area was marked and prepped with Betadine. A 25-gauge 1-1/2 inch needle was inserted into the skin and subcutaneous tissue and 1 mL of 1% lidocaine was injected. Then a 25-gauge 3.5 inch spinal needle was inserted under fluoroscopic guidance into the left sacroiliac joint. AP and lateral images were utilized. Omnipaque 180x0.5 mL under live fluoroscopy demonstrated no intravascular uptake. Then a solution containing one ML of 40 mg per mL depomedrol and 2 ML of 1% lidocaine MPF was injected x1.5 mL. Patient tolerated the procedure well. Post procedure instructions were given. Please see post procedure form.

## 2012-07-29 ENCOUNTER — Other Ambulatory Visit: Payer: Self-pay | Admitting: Physical Medicine & Rehabilitation

## 2012-07-29 ENCOUNTER — Encounter

## 2012-07-29 ENCOUNTER — Ambulatory Visit: Admitting: Physical Medicine & Rehabilitation

## 2012-07-29 NOTE — Telephone Encounter (Signed)
Patient is requesting heat wrap ([facility]mac[facility]) script.  Please adivse.

## 2012-07-30 ENCOUNTER — Telehealth: Payer: Self-pay

## 2012-07-30 MED ORDER — OXYMORPHONE HCL ER 20 MG PO TB12: 20.0000 mg | ORAL_TABLET | Freq: Two times a day (BID) | ORAL | Status: DC

## 2012-07-30 NOTE — Telephone Encounter (Signed)
Patient request opana refill.  Appointment was cancelled because botox could not get approved.  Script printed to be signed.

## 2012-07-30 NOTE — Telephone Encounter (Signed)
Patient script is ready for pick up.

## 2012-08-01 IMAGING — RF DG LUMBAR SPINE 2-3V
1 series · 2 of 2 positions shown · non-contrast
Comparison: CT discogram 06/06/2010.

CLINICAL DATA: L4-L5 lumbar fusion.

LUMBAR SPINE - 2-3 VIEW

[Series 1: run · 2 of 2 slices shown]
[im 1/2]
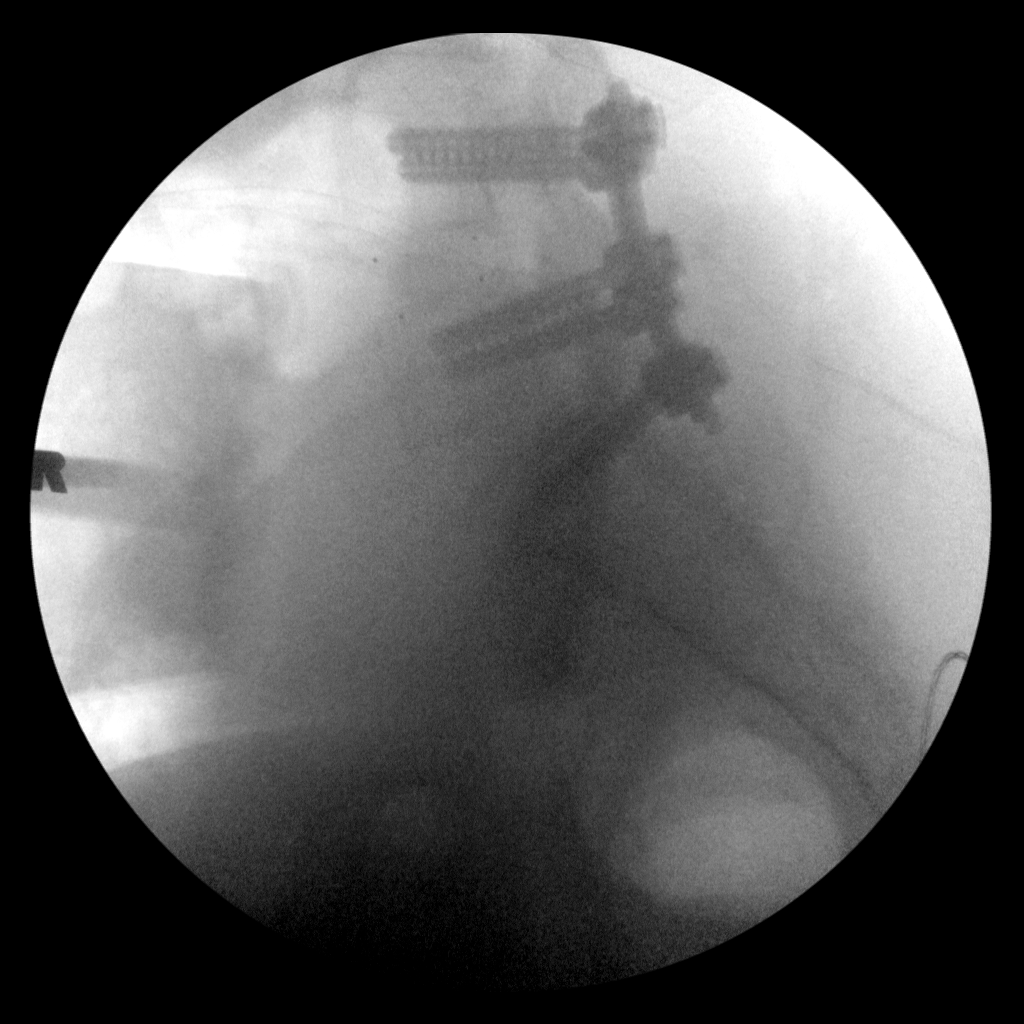
[im 2/2]
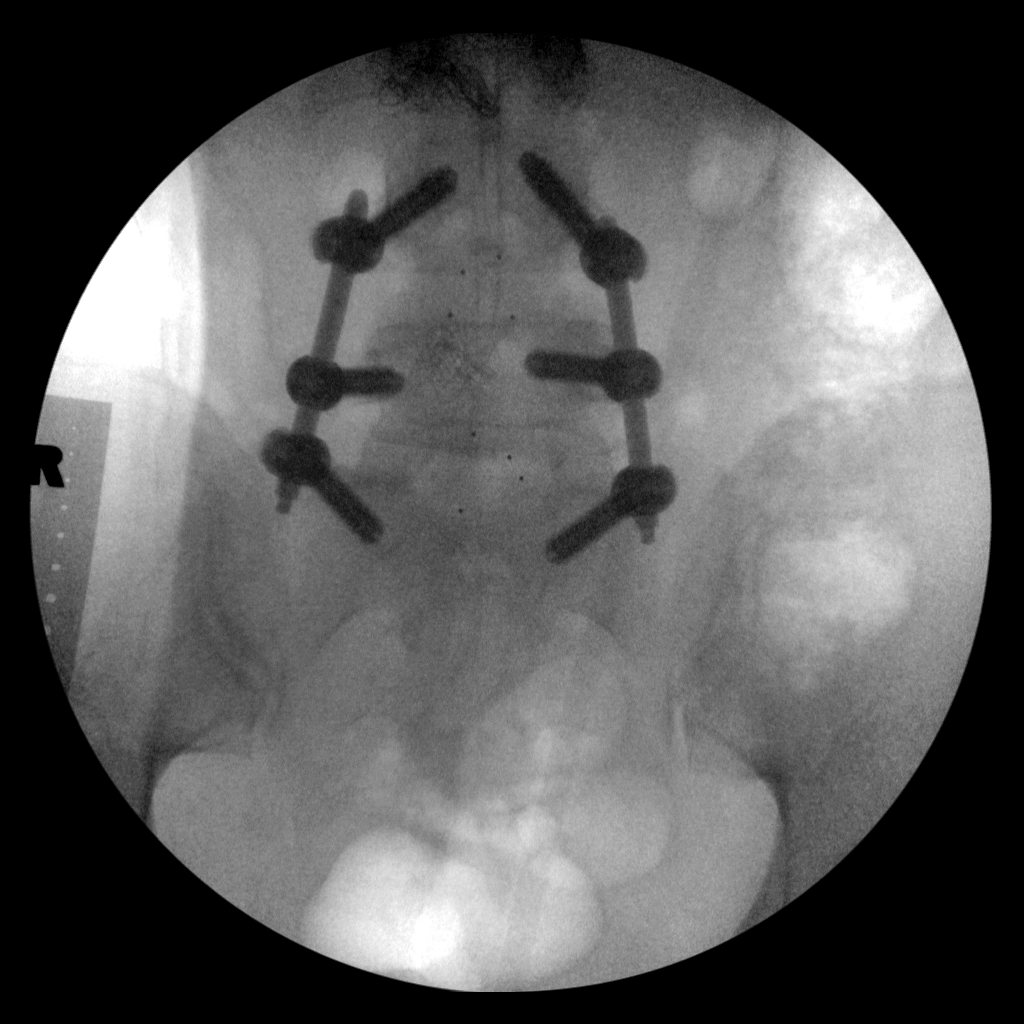

[2 of 2 positions shown; findings below may reference images not displayed]

FINDINGS: Spot PA and lateral fluoroscopic images of the lower
lumbar spine demonstrate bilateral pedicle screw and interbody
fusion extending from L4-S1.  Bone detail is limited on these
fluoroscopic images.  The inferior interbody spacer is not well
visualized in the lateral projection.  No anterior hardware is
evident.
IMPRESSION: Intraoperative views following L4-S1 fusion. There is limited
visualization of the L5-S1 disc space in the lateral projection.

## 2012-08-01 IMAGING — CR DG OR LOCAL ABDOMEN
1 series · 1 of 1 positions shown · non-contrast
Comparison: Lumbar spine CT 06/07/1999

CLINICAL DATA: L4-S1 A L I F

OR LOCAL ABDOMEN

[AP]
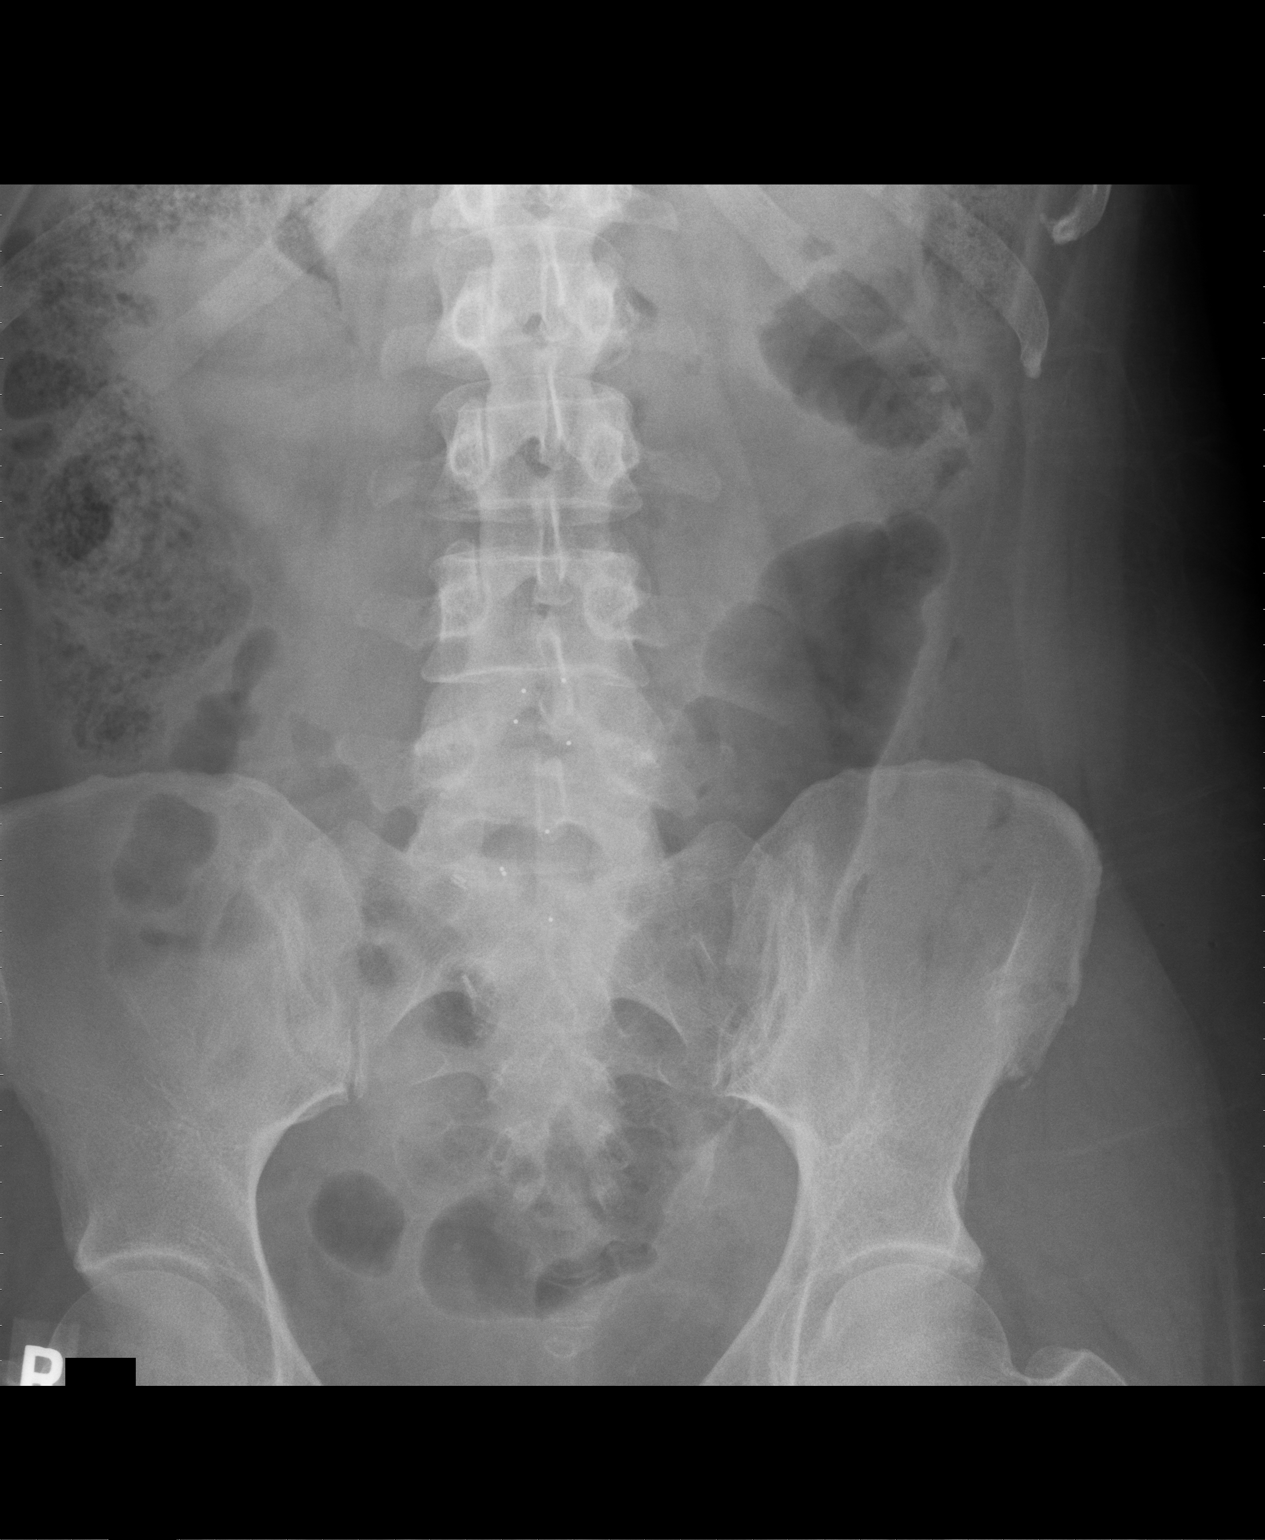

[1 of 1 positions shown; findings below may reference images not displayed]

FINDINGS: There are no retained surgical instruments within the
field of view. Interbody spacer markers are noted at L4 - L5 and L5
- S1.  Vascular clips noted.
IMPRESSION: No retained surgical instruments.

## 2012-09-01 ENCOUNTER — Telehealth: Payer: Self-pay

## 2012-09-01 MED ORDER — OXYMORPHONE HCL ER 20 MG PO TB12: 20.0000 mg | ORAL_TABLET | Freq: Two times a day (BID) | ORAL | Status: DC

## 2012-09-01 NOTE — Telephone Encounter (Signed)
Patient called requesting opana refill.

## 2012-09-01 NOTE — Telephone Encounter (Signed)
Patient aware opana script is ready for pick up.

## 2012-09-01 NOTE — Telephone Encounter (Signed)
Printed for Karen to sign 

## 2012-09-03 ENCOUNTER — Encounter: Payer: Self-pay | Admitting: Physical Medicine & Rehabilitation

## 2012-09-03 ENCOUNTER — Encounter: Attending: Physical Medicine & Rehabilitation

## 2012-09-03 ENCOUNTER — Ambulatory Visit (HOSPITAL_BASED_OUTPATIENT_CLINIC_OR_DEPARTMENT_OTHER): Admitting: Physical Medicine & Rehabilitation

## 2012-09-03 VITALS — BP 122/68 | HR 74 | Resp 14 | Ht 67.5 in | Wt 205.8 lb

## 2012-09-03 DIAGNOSIS — M62838 Other muscle spasm: Secondary | ICD-10-CM

## 2012-09-03 DIAGNOSIS — M5380 Other specified dorsopathies, site unspecified: Secondary | ICD-10-CM | POA: Insufficient documentation

## 2012-09-03 DIAGNOSIS — M545 Low back pain, unspecified: Secondary | ICD-10-CM | POA: Insufficient documentation

## 2012-09-03 DIAGNOSIS — G8929 Other chronic pain: Secondary | ICD-10-CM | POA: Insufficient documentation

## 2012-09-03 DIAGNOSIS — K5909 Other constipation: Secondary | ICD-10-CM | POA: Insufficient documentation

## 2012-09-03 DIAGNOSIS — Z981 Arthrodesis status: Secondary | ICD-10-CM | POA: Insufficient documentation

## 2012-09-03 DIAGNOSIS — I10 Essential (primary) hypertension: Secondary | ICD-10-CM | POA: Insufficient documentation

## 2012-09-03 DIAGNOSIS — T40605A Adverse effect of unspecified narcotics, initial encounter: Secondary | ICD-10-CM | POA: Insufficient documentation

## 2012-09-03 DIAGNOSIS — M961 Postlaminectomy syndrome, not elsewhere classified: Secondary | ICD-10-CM | POA: Insufficient documentation

## 2012-09-03 NOTE — Progress Notes (Signed)
Botulinum toxin injection under needle EMG guidance Indication muscle spasm in the lumbar paraspinal muscles chronic and severe which does not respond to oral anti-spasticity medications or physical therapy Informed consent was obtained after describing risks and benefits of the procedure with the patient these include bleeding bruising and infection she elects to proceed and has given written consent patient placed in a forward flexed posture. There is adjacent to L4-L5 and S1 marked and prepped with betadine and alcohol. Entered with a 25-gauge 50 mm needle electrode under needle EMG guidance 25 units of Botox injected into each of 8 sites which included the gluteus medius muscle. Post injection instructions given. The majority of muscle activity on EMG was noted at L4

## 2012-09-03 NOTE — Patient Instructions (Addendum)
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

## 2012-09-09 ENCOUNTER — Ambulatory Visit: Payer: Self-pay | Admitting: Physical Medicine & Rehabilitation

## 2012-09-28 ENCOUNTER — Other Ambulatory Visit: Payer: Self-pay

## 2012-09-28 MED ORDER — TIZANIDINE HCL 4 MG PO TABS: 4.0000 mg | ORAL_TABLET | Freq: Three times a day (TID) | ORAL | Status: DC | PRN

## 2012-09-30 ENCOUNTER — Ambulatory Visit (HOSPITAL_BASED_OUTPATIENT_CLINIC_OR_DEPARTMENT_OTHER): Admitting: Physical Medicine & Rehabilitation

## 2012-09-30 ENCOUNTER — Encounter: Payer: Self-pay | Admitting: Physical Medicine & Rehabilitation

## 2012-09-30 ENCOUNTER — Encounter: Attending: Physical Medicine & Rehabilitation

## 2012-09-30 VITALS — BP 138/92 | HR 79 | Resp 14 | Ht 67.5 in | Wt 201.0 lb

## 2012-09-30 DIAGNOSIS — M545 Low back pain, unspecified: Secondary | ICD-10-CM | POA: Insufficient documentation

## 2012-09-30 DIAGNOSIS — M533 Sacrococcygeal disorders, not elsewhere classified: Secondary | ICD-10-CM

## 2012-09-30 DIAGNOSIS — Z5181 Encounter for therapeutic drug level monitoring: Secondary | ICD-10-CM

## 2012-09-30 DIAGNOSIS — IMO0001 Reserved for inherently not codable concepts without codable children: Secondary | ICD-10-CM | POA: Insufficient documentation

## 2012-09-30 DIAGNOSIS — Z79899 Other long term (current) drug therapy: Secondary | ICD-10-CM

## 2012-09-30 MED ORDER — OXYMORPHONE HCL ER 20 MG PO TB12: 20.0000 mg | ORAL_TABLET | Freq: Two times a day (BID) | ORAL | Status: DC

## 2012-09-30 MED ORDER — BACLOFEN 20 MG PO TABS: ORAL_TABLET | ORAL | Status: DC

## 2012-09-30 NOTE — Progress Notes (Signed)
Right sacroiliac injection under fluoroscopic guidance  Indication: Right Low back and buttocks pain not relieved by medication management and other conservative care.  Informed consent was obtained after describing risks and benefits of the procedure with the patient, this includes bleeding, bruising, infection, paralysis and medication side effects. The patient wishes to proceed and has given written consent. The patient was placed in a prone position. The lumbar and sacral area was marked and prepped with Betadine. A 25-gauge 1-1/2 inch needle was inserted into the skin and subcutaneous tissue and 1 mL of 1% lidocaine was injected. Then a 25-gauge 3.5 inch spinal needle was inserted under fluoroscopic guidance into the Right sacroiliac joint. AP and lateral images were utilized. Omnipaque 180x0.5 mL under live fluoroscopy demonstrated no intravascular uptake. Then a solution containing one ML of 40 mg per mL depomedrol and 2 ML of 1% lidocaine MPF was injected x1.5 mL. Patient tolerated the procedure well. Post procedure instructions were given. Please see post procedure form. Patient initially had left-sided pain at last visit more right-sided currently.   

## 2012-09-30 NOTE — Patient Instructions (Signed)
May return to work tomorrow no driving today

## 2012-09-30 NOTE — Progress Notes (Signed)
  PROCEDURE RECORD [facility]   Name: NEESHA LANGTON DOB:[birth year] MRN: [id number]  Date:09/30/2012  Physician: Claudette Laws, MD    Nurse/CMA: Kelli Churn, CMA/Shumaker, RN  Allergies:  Allergies  Allergen Reactions  . Adhesive (Tape)     Steri-strips caused blisters  . Latex Itching and Rash  . Lisinopril     Difficult swallowing  . Relafen (Nabumetone) Swelling  . Codeine Itching  . Robaxin (Methocarbamol) Other (See Comments)    Hospital gave it to her she is unsure of reaction    Consent Signed: yes  Is patient diabetic? no   Pregnant: no LMP: No LMP recorded. Patient has had a hysterectomy. (age 45-55)  Anticoagulants: no Anti-inflammatory: no Antibiotics: no  Procedure: right Sacroiliac injection  Position: Prone Start Time: 11:45  End Time: 11:48  Fluoro Time: 12 seconds  RN/CMA Levens, CMA Shumaker, RN    Time 1121 11:52    BP 138/92 128/70    Pulse 79 90    Respirations 14 14    O2 Sat 99 100    S/S 6 6    Pain Level 7/10 7/10     D/C home with Husband Bruce, patient A & O X 3, D/C instructions reviewed, and sits independently.

## 2012-10-28 ENCOUNTER — Encounter: Payer: Self-pay | Admitting: Physical Medicine & Rehabilitation

## 2012-10-28 ENCOUNTER — Ambulatory Visit: Payer: Self-pay | Admitting: Physical Medicine & Rehabilitation

## 2012-10-28 ENCOUNTER — Encounter: Attending: Physical Medicine & Rehabilitation

## 2012-10-28 ENCOUNTER — Ambulatory Visit (HOSPITAL_BASED_OUTPATIENT_CLINIC_OR_DEPARTMENT_OTHER): Admitting: Physical Medicine & Rehabilitation

## 2012-10-28 VITALS — BP 154/100 | HR 93 | Resp 16 | Ht 67.5 in | Wt 202.0 lb

## 2012-10-28 DIAGNOSIS — M62838 Other muscle spasm: Secondary | ICD-10-CM

## 2012-10-28 DIAGNOSIS — K5909 Other constipation: Secondary | ICD-10-CM | POA: Insufficient documentation

## 2012-10-28 DIAGNOSIS — Z981 Arthrodesis status: Secondary | ICD-10-CM | POA: Insufficient documentation

## 2012-10-28 DIAGNOSIS — M961 Postlaminectomy syndrome, not elsewhere classified: Secondary | ICD-10-CM | POA: Insufficient documentation

## 2012-10-28 DIAGNOSIS — I10 Essential (primary) hypertension: Secondary | ICD-10-CM | POA: Insufficient documentation

## 2012-10-28 DIAGNOSIS — G8929 Other chronic pain: Secondary | ICD-10-CM | POA: Insufficient documentation

## 2012-10-28 DIAGNOSIS — M545 Low back pain, unspecified: Secondary | ICD-10-CM | POA: Insufficient documentation

## 2012-10-28 DIAGNOSIS — G894 Chronic pain syndrome: Secondary | ICD-10-CM

## 2012-10-28 DIAGNOSIS — M533 Sacrococcygeal disorders, not elsewhere classified: Secondary | ICD-10-CM | POA: Insufficient documentation

## 2012-10-28 DIAGNOSIS — N319 Neuromuscular dysfunction of bladder, unspecified: Secondary | ICD-10-CM

## 2012-10-28 DIAGNOSIS — M5380 Other specified dorsopathies, site unspecified: Secondary | ICD-10-CM | POA: Insufficient documentation

## 2012-10-28 DIAGNOSIS — T40605A Adverse effect of unspecified narcotics, initial encounter: Secondary | ICD-10-CM | POA: Insufficient documentation

## 2012-10-28 MED ORDER — POLYETHYLENE GLYCOL 3350 17 GM/SCOOP PO POWD: 17.0000 g | Freq: Two times a day (BID) | ORAL | Status: DC

## 2012-10-28 MED ORDER — OXYMORPHONE HCL ER 20 MG PO TB12: 20.0000 mg | ORAL_TABLET | Freq: Two times a day (BID) | ORAL | Status: DC

## 2012-10-28 NOTE — Progress Notes (Signed)
Subjective:    Patient ID: Molly Norton, female    DOB: 06-01-1966, 47 y.o.   MRN: [id number]  HPI Right sacroiliac injection under fluoroscopic guidance 09/30/2012 Botox injection lumbar paraspinal muscles 09/03/2012      both of these injections lasted somewhere between 8 and 10 weeks rather than 12 weeks. Unfortunately we cannot do these more frequently.    Now working at a desk job 8 hours per day.            Pain Inventory Average Pain 6 Pain Right Now 5 My pain is sharp, burning, stabbing, tingling and aching  In the last 24 hours, has pain interfered with the following? General activity 4 Relation with others 4 Enjoyment of life 5 What TIME of day is your pain at its worst? evening, night time Sleep (in general) Poor  Pain is worse with: walking, sitting, inactivity, standing and some activites Pain improves with: rest, heat/ice, therapy/exercise, pacing activities, medication and injections Relief from Meds: 7  Mobility walk without assistance walk with assistance use a cane use a walker how many minutes can you walk? 15 ability to climb steps?  yes do you drive?  yes use a wheelchair needs help with transfers Do you have any goals in this area?  yes  Function employed # of hrs/week 40 I need assistance with the following:  meal prep, household duties and shopping Do you have any goals in this area?  yes  Neuro/Psych bladder control problems numbness tremor tingling trouble walking spasms  Prior Studies Any changes since last visit?  no  Physicians involved in your care Any changes since last visit?  no   Family History  Problem Relation Age of Onset  . Hypertension Mother   . Heart disease Father    History   Social History  . Marital Status: Single    Spouse Name: N/A    Number of Children: N/A  . Years of Education: N/A   Social History Main Topics  . Smoking status: Never Smoker   . Smokeless tobacco: Never Used  . Alcohol  Use: No  . Drug Use: No  . Sexually Active: None   Other Topics Concern  . None   Social History Narrative  . None   Past Surgical History  Procedure Laterality Date  . Nasal sinus surgery    . Cesarean section  1990  . Abdominal hysterectomy    . Laparoscopic ovarian cystectomy      post cyst rupture  . Anterior lumbar fusion  12/19/2010    L4-5 and L5-S1   Past Medical History  Diagnosis Date  . DDD (degenerative disc disease)     at L4-5 and L5-S1  . Chronic lower back pain    BP 154/100  Pulse 93  Resp 16  Ht 5' 7.5" (1.715 m)  Wt 202 lb (91.627 kg)  BMI 31.15 kg/m2  SpO2 98%      Review of Systems  Gastrointestinal: Positive for constipation.  Neurological: Positive for tremors and numbness.       Tingling,spasms  All other systems reviewed and are negative.       Objective:   Physical Exam  Nursing note and vitals reviewed. Constitutional: She is oriented to person, place, and time. She appears well-developed and well-nourished.  HENT:  Head: Normocephalic and atraumatic.  Eyes: Conjunctivae and EOM are normal. Pupils are equal, round, and reactive to light.  Musculoskeletal:       Lumbar back: She exhibits  decreased range of motion and tenderness.  L PSIS  Neurological: She is alert and oriented to person, place, and time.          Assessment & Plan:  1.  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  Functioning better, working full time office position 2.  R sacroiliac pain repeat in 2 mo 3.  Severe lumbar spasm improved with botox now starting to wear off.  Reschedule in 1 mo  Over half of the 25 min visit was spent counseling and coordinating care.

## 2012-10-29 ENCOUNTER — Ambulatory Visit: Admitting: Physical Medicine & Rehabilitation

## 2012-10-29 ENCOUNTER — Encounter

## 2012-11-17 ENCOUNTER — Other Ambulatory Visit: Payer: Self-pay

## 2012-11-17 MED ORDER — IBUPROFEN 800 MG PO TABS: ORAL_TABLET | ORAL | Status: DC

## 2012-11-17 NOTE — Telephone Encounter (Signed)
Ibuprofen refilled.

## 2012-12-06 ENCOUNTER — Telehealth: Payer: Self-pay

## 2012-12-06 MED ORDER — OXYMORPHONE HCL ER 20 MG PO TB12: 20.0000 mg | ORAL_TABLET | Freq: Two times a day (BID) | ORAL | Status: DC

## 2012-12-06 NOTE — Telephone Encounter (Signed)
Patient called for opana refill.  Tried to get her in for an appoinment to day to prevent delay, she denied because she says she needs time off after her injection because she hurst so bad and cant work.  Opana printed.  Will call patient when its signed.

## 2012-12-07 NOTE — Telephone Encounter (Signed)
rx available for pickup 

## 2012-12-09 ENCOUNTER — Ambulatory Visit (HOSPITAL_BASED_OUTPATIENT_CLINIC_OR_DEPARTMENT_OTHER): Payer: Federal, State, Local not specified - PPO | Admitting: Physical Medicine & Rehabilitation

## 2012-12-09 ENCOUNTER — Encounter: Payer: Self-pay | Admitting: Physical Medicine & Rehabilitation

## 2012-12-09 ENCOUNTER — Encounter: Attending: Physical Medicine & Rehabilitation

## 2012-12-09 VITALS — BP 138/89 | HR 69 | Resp 16 | Ht 67.0 in | Wt 202.0 lb

## 2012-12-09 DIAGNOSIS — M5380 Other specified dorsopathies, site unspecified: Secondary | ICD-10-CM | POA: Insufficient documentation

## 2012-12-09 DIAGNOSIS — M62838 Other muscle spasm: Secondary | ICD-10-CM

## 2012-12-09 MED ORDER — TIZANIDINE HCL 4 MG PO TABS: 4.0000 mg | ORAL_TABLET | Freq: Three times a day (TID) | ORAL | Status: DC | PRN

## 2012-12-09 NOTE — Progress Notes (Signed)
Botulinum toxin injection under needle EMG guidance Indication muscle spasm in the lumbar paraspinal muscles chronic and severe which does not respond to oral anti-spasticity medications or physical therapy Informed consent was obtained after describing risks and benefits of the procedure with the patient these include bleeding bruising and infection she elects to proceed and has given written consent patient placed in a forward flexed posture. There is adjacent to L4-L5 and S1 marked and prepped with betadine and alcohol. Entered with a 25-gauge 50 mm needle electrode under needle EMG guidance 25 units of Botox injected into Bilateral gluteus medius Bilateral S1 paraspinal muscles Bilateral L5 paraspinal muscles 12.5 units left L4 Paraspinal muscles 37.5 units right L4 paraspinal muscles  Patient tolerated procedure well. All injections done after appropriate EMG activity

## 2012-12-28 ENCOUNTER — Telehealth: Payer: Self-pay

## 2012-12-28 NOTE — Telephone Encounter (Signed)
Patient request opana refill.  She is going out of the country and will be out during the trip.

## 2012-12-29 MED ORDER — OXYMORPHONE HCL ER 20 MG PO TB12: 20.0000 mg | ORAL_TABLET | Freq: Two times a day (BID) | ORAL | Status: DC

## 2012-12-29 NOTE — Telephone Encounter (Signed)
OK 

## 2012-12-29 NOTE — Telephone Encounter (Signed)
Patient informed opana script will be ready for pick up Thursday afternoon.

## 2013-01-20 ENCOUNTER — Telehealth: Payer: Self-pay

## 2013-01-20 ENCOUNTER — Encounter: Attending: Physical Medicine & Rehabilitation

## 2013-01-20 ENCOUNTER — Ambulatory Visit (HOSPITAL_BASED_OUTPATIENT_CLINIC_OR_DEPARTMENT_OTHER): Admitting: Physical Medicine & Rehabilitation

## 2013-01-20 VITALS — BP 143/87 | HR 82 | Resp 14 | Ht 67.5 in | Wt 200.2 lb

## 2013-01-20 DIAGNOSIS — M545 Low back pain, unspecified: Secondary | ICD-10-CM | POA: Insufficient documentation

## 2013-01-20 DIAGNOSIS — IMO0001 Reserved for inherently not codable concepts without codable children: Secondary | ICD-10-CM | POA: Insufficient documentation

## 2013-01-20 DIAGNOSIS — M533 Sacrococcygeal disorders, not elsewhere classified: Secondary | ICD-10-CM

## 2013-01-20 MED ORDER — THERMACARE BACK/HIP MISC: 1.0000 | Freq: Every day | Status: DC

## 2013-01-20 MED ORDER — OXYMORPHONE HCL ER 20 MG PO TB12: 20.0000 mg | ORAL_TABLET | Freq: Two times a day (BID) | ORAL | Status: DC

## 2013-01-20 NOTE — Telephone Encounter (Signed)
Pharmacy called and wanted to inform us that they only had the tizanidine in capsule form. Rx said dispense tablets. Per Darrol Angel I gave them the okay to change to dispense capsules.

## 2013-01-20 NOTE — Patient Instructions (Signed)
Radiofrequency of SI is the procedure we talked about.  Success depends on how your nerves exit the spine  Other options include SI fusion by Dr Yevette Edwards  See PA for meds   Botox next visit with me

## 2013-01-20 NOTE — Progress Notes (Signed)
Right sacroiliac injection under fluoroscopic guidance  Indication: Right Low back and buttocks pain not relieved by medication management and other conservative care.  Informed consent was obtained after describing risks and benefits of the procedure with the patient, this includes bleeding, bruising, infection, paralysis and medication side effects. The patient wishes to proceed and has given written consent. The patient was placed in a prone position. The lumbar and sacral area was marked and prepped with Betadine. A 25-gauge 1-1/2 inch needle was inserted into the skin and subcutaneous tissue and 1 mL of 1% lidocaine was injected. Then a 25-gauge 3.5 inch spinal needle was inserted under fluoroscopic guidance into the Right sacroiliac joint. AP and lateral images were utilized. Omnipaque 180x0.5 mL under live fluoroscopy demonstrated no intravascular uptake. Then a solution containing one ML of 40 mg per mL depomedrol and 2 ML of 1% lidocaine MPF was injected x1.5 mL. Patient tolerated the procedure well. Post procedure instructions were given. Please see post procedure form. Patient initially had left-sided pain at last visit more right-sided currently.  Will f/u with PA for meds see me in 6-7 weeks for repeat Botox to lumbar 728.85

## 2013-01-20 NOTE — Progress Notes (Signed)
  PROCEDURE RECORD [facility]   Name: Molly Norton DOB:[birth year] MRN: [id number]  Date:01/20/2013  Physician: Claudette Laws, MD    Nurse/CMA: Kelli Churn RN  Lowella Bandy CMA  Allergies:  Allergies  Allergen Reactions  . Adhesive [Tape]     Steri-strips caused blisters  . Latex Itching and Rash  . Lisinopril     Difficult swallowing  . Relafen [Nabumetone] Swelling  . Codeine Itching  . Robaxin [Methocarbamol] Other (See Comments)    Hospital gave it to her she is unsure of reaction    Consent Signed: yes  Is patient diabetic? no  CBG today?   Pregnant: no LMP: No LMP recorded. Patient has had a hysterectomy. (age 80-55)  Anticoagulants: no Anti-inflammatory: no Antibiotics: no  Procedure:Right Sacroiliac Steroid Injection Position: Prone Start Time:9:20 End Time: 9:23 Fluoro Time: 9.2seconds  RN/CMA Designer, multimedia    Time  8:57am 9:28 am    BP 143/87 127/72    Pulse 82 94    Respirations 14 14    O2 Sat 99 100    S/S 6 6    Pain Level 5/10 6/10     D/C home with husband, patient A & O X 3, D/C instructions reviewed, and sits independently.

## 2013-04-06 ENCOUNTER — Encounter: Attending: Physical Medicine and Rehabilitation | Admitting: Physical Medicine and Rehabilitation

## 2013-04-06 ENCOUNTER — Encounter: Payer: Self-pay | Admitting: Physical Medicine and Rehabilitation

## 2013-04-06 VITALS — BP 193/109 | HR 97 | Resp 14 | Ht 67.0 in | Wt 202.0 lb

## 2013-04-06 DIAGNOSIS — M533 Sacrococcygeal disorders, not elsewhere classified: Secondary | ICD-10-CM | POA: Insufficient documentation

## 2013-04-06 DIAGNOSIS — M545 Low back pain, unspecified: Secondary | ICD-10-CM | POA: Insufficient documentation

## 2013-04-06 DIAGNOSIS — R209 Unspecified disturbances of skin sensation: Secondary | ICD-10-CM | POA: Insufficient documentation

## 2013-04-06 DIAGNOSIS — Z981 Arthrodesis status: Secondary | ICD-10-CM | POA: Insufficient documentation

## 2013-04-06 DIAGNOSIS — M961 Postlaminectomy syndrome, not elsewhere classified: Secondary | ICD-10-CM

## 2013-04-06 MED ORDER — DICLOFENAC EPOLAMINE 1.3 % TD PTCH: 1.0000 | MEDICATED_PATCH | Freq: Two times a day (BID) | TRANSDERMAL | Status: DC

## 2013-04-06 MED ORDER — OXYMORPHONE HCL ER 20 MG PO TB12: 20.0000 mg | ORAL_TABLET | Freq: Two times a day (BID) | ORAL | Status: DC

## 2013-04-06 NOTE — Patient Instructions (Signed)
Do the exercises I showed to you today, every day if possible

## 2013-04-06 NOTE — Progress Notes (Signed)
Subjective:    Patient ID: GRACIANA SESSA, female    DOB: May 15, 1966, 47 y.o.   MRN: [id number]  HPI The patient is a 47 year old female, who presents with LBP and right SI pain, she also complains about spasms in her right buttock . The symptoms started 3 years ago. The patient complains about moderate to severe pain. Patient denies any severe radiating symptoms into her LE. Patient also complains about numbness and tingling intermittendly in the same area, rarely into her right LE . She describes the pain as aching and sometimes stabbing . Applying heat or ice, taking medications , changing positions alleviate the symptoms. Prolonged sitting, standing or prolonged walking  aggrevates the symptoms. The patient grades her pain as a 8 /10. SI injections usually give her 40% relief for about 10 weeks, last one in August 2014 Hx of lumbar fusion anterior and posterior approach , 2011, by Dr. Marthenia Rolling Pain Inventory Average Pain 6 Pain Right Now 8 My pain is sharp, burning, stabbing, tingling and aching  In the last 24 hours, has pain interfered with the following? General activity 4 Relation with others 4 Enjoyment of life 5 What TIME of day is your pain at its worst? 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  no  Physicians involved in your care Any changes since last visit?  no   Family History  Problem Relation Age of Onset  . Hypertension Mother   . Heart disease Father    History   Social History  . Marital  Status: Single    Spouse Name: N/A    Number of Children: N/A  . Years of Education: N/A   Social History Main Topics  . Smoking status: Never Smoker   . Smokeless tobacco: Never Used  . Alcohol Use: No  . Drug Use: No  . Sexual Activity: None   Other Topics Concern  . None   Social History Narrative  . None   Past Surgical History  Procedure Laterality Date  . Nasal sinus surgery    . Cesarean section  1990  . Abdominal hysterectomy    . Laparoscopic ovarian cystectomy      post cyst rupture  . Anterior lumbar fusion  12/19/2010    L4-5 and L5-S1   Past Medical History  Diagnosis Date  . DDD (degenerative disc disease)     at L4-5 and L5-S1  . Chronic lower back pain    BP 193/109  Pulse 97  Resp 14  Ht 5\' 7"  (1.702 m)  Wt 202 lb (91.627 kg)  BMI 31.63 kg/m2  SpO2 100%     Review of Systems  Musculoskeletal: Positive for gait problem.  Neurological: Positive for dizziness and numbness.  All other systems reviewed and are negative.       Objective:   Physical Exam  Constitutional: She is oriented to person, place, and time. She appears well-developed and well-nourished.  HENT:  Head: Normocephalic.  Neck: Neck supple.  Musculoskeletal: She exhibits tenderness.  Neurological: She is alert and oriented to person, place, and time.  Skin: Skin is warm  and dry.  Psychiatric: She has a normal mood and affect.    Symmetric normal motor tone is noted throughout. Normal muscle bulk. Muscle testing reveals 5/5 muscle strength of the upper extremity, and 5/5 of the lower extremity, except right iliopsoas and right quadriceps 4+/5. Full range of motion in upper and lower extremities. ROM of spine is restricted. Fine motor movements are normal in both hands. Sensory is intact and symmetric to light touch, pinprick and proprioception. DTR in the upper and lower extremity are present and symmetric 2+. No clonus is noted.  Patient arises from chair without  difficulty. Narrow based gait with normal arm swing bilateral , able to walk on heels and toes . Tandem walk is stable. No pronator drift. Rhomberg negative.        Assessment & Plan:  This is a 47 year old female with 1.Hx of lumbar fusion anterior and posterior approach, L4-S1 , 2011, by Dr. Marthenia Rolling 2.SI dysfunction 3.LBP  Plan : Showed patient exercises to strengthen , protect and stabilize the SI joints in great detail. Prescribed Flector patches for exacerbation, patient complains about stomach pain when taking oral NSAIDs, advised patient to not take any oral NSAIDs when applying the flector patches. Refilled Opana 20mg , bid. Spent with patient face to face. Follow up in 1 month

## 2013-04-30 ENCOUNTER — Other Ambulatory Visit: Payer: Self-pay | Admitting: Physical Medicine & Rehabilitation

## 2013-05-03 ENCOUNTER — Other Ambulatory Visit: Payer: Self-pay | Admitting: Physical Medicine & Rehabilitation

## 2013-05-04 ENCOUNTER — Encounter: Payer: Self-pay | Admitting: Physical Medicine and Rehabilitation

## 2013-05-04 ENCOUNTER — Encounter: Attending: Physical Medicine and Rehabilitation | Admitting: Physical Medicine and Rehabilitation

## 2013-05-04 VITALS — BP 178/107 | HR 86 | Resp 14 | Ht 67.0 in | Wt 205.0 lb

## 2013-05-04 DIAGNOSIS — M961 Postlaminectomy syndrome, not elsewhere classified: Secondary | ICD-10-CM

## 2013-05-04 DIAGNOSIS — M533 Sacrococcygeal disorders, not elsewhere classified: Secondary | ICD-10-CM | POA: Insufficient documentation

## 2013-05-04 DIAGNOSIS — M545 Low back pain, unspecified: Secondary | ICD-10-CM | POA: Insufficient documentation

## 2013-05-04 DIAGNOSIS — Z79899 Other long term (current) drug therapy: Secondary | ICD-10-CM

## 2013-05-04 DIAGNOSIS — Z5181 Encounter for therapeutic drug level monitoring: Secondary | ICD-10-CM

## 2013-05-04 DIAGNOSIS — Z981 Arthrodesis status: Secondary | ICD-10-CM | POA: Insufficient documentation

## 2013-05-04 MED ORDER — OXYMORPHONE HCL ER 20 MG PO TB12: 20.0000 mg | ORAL_TABLET | Freq: Two times a day (BID) | ORAL | Status: DC

## 2013-05-04 NOTE — Progress Notes (Signed)
Subjective:    Patient ID: Molly Norton, female    DOB: December 26, 1965, 47 y.o.   MRN: [id number]  HPI The patient is a 47 year old female, who presents with LBP and right SI pain, she also complains about spasms in her right buttock . The symptoms started 3 years ago. The patient complains about moderate to severe pain. Patient denies any severe radiating symptoms into her LE. Patient also complains about numbness and tingling intermittendly in the same area, rarely into her right LE . She describes the pain as aching and sometimes stabbing . Applying heat or ice, taking medications , changing positions alleviate the symptoms. Prolonged sitting, standing or prolonged walking aggrevates the symptoms. The patient grades her pain as a 9 /10.  SI injections usually give her 40% relief for about 10 weeks, last one in August 2014  Hx of lumbar fusion anterior and posterior approach , 2011, by Dr. Marthenia Rolling The problem has been stable  Pain Inventory Average Pain 9 Pain Right Now 9 My pain is sharp, burning, stabbing, tingling and aching  In the last 24 hours, has pain interfered with the following? General activity 4 Relation with others 4 Enjoyment of life 5 What TIME of day is your pain at its worst? evening, night Sleep (in general) Poor  Pain is worse with: walking, sitting, inactivity, standing and some activites Pain improves with: rest, heat/ice, therapy/exercise, pacing activities, medication and injections Relief from Meds: 7  Mobility walk without assistance how many minutes can you walk? 15 do you drive?  yes use a wheelchair transfers alone Do you have any goals in this area?  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  yes  Neuro/Psych bladder control problems numbness tremor tingling trouble walking spasms  Prior  Studies Any changes since last visit?  no  Physicians involved in your care Any changes since last visit?  no   Family History  Problem Relation Age of Onset  . Hypertension Mother   . Heart disease Father    History   Social History  . Marital Status: Single    Spouse Name: N/A    Number of Children: N/A  . Years of Education: N/A   Social History Main Topics  . Smoking status: Never Smoker   . Smokeless tobacco: Never Used  . Alcohol Use: No  . Drug Use: No  . Sexual Activity: None   Other Topics Concern  . None   Social History Narrative  . None   Past Surgical History  Procedure Laterality Date  . Nasal sinus surgery    . Cesarean section  1990  . Abdominal hysterectomy    . Laparoscopic ovarian cystectomy      post cyst rupture  . Anterior lumbar fusion  12/19/2010    L4-5 and L5-S1   Past Medical History  Diagnosis Date  . DDD (degenerative disc disease)     at L4-5 and L5-S1  . Chronic lower back pain    BP 178/107  Pulse 86  Resp 14  Ht 5\' 7"  (1.702 m)  Wt 205 lb (92.987 kg)  BMI 32.10 kg/m2  SpO2 99%     Review of Systems  Genitourinary:       Bladder control problems  Musculoskeletal: Positive for back pain and gait problem.  Neurological: Positive for tremors  and numbness.       Tingling, spasms       Objective:   Physical Exam Constitutional: She is oriented to person, place, and time. She appears well-developed and well-nourished.  HENT:  Head: Normocephalic.  Neck: Neck supple.  Musculoskeletal: She exhibits tenderness.  Neurological: She is alert and oriented to person, place, and time.  Skin: Skin is warm and dry.  Psychiatric: She has a normal mood and affect.  Symmetric normal motor tone is noted throughout. Normal muscle bulk. Muscle testing reveals 5/5 muscle strength of the upper extremity, and 5/5 of the lower extremity, except right iliopsoas and right quadriceps 4+/5. Full range of motion in upper and lower  extremities. ROM of spine is restricted. Fine motor movements are normal in both hands.  Sensory is intact and symmetric to light touch, pinprick and proprioception.  DTR in the upper and lower extremity are present and symmetric 2+. No clonus is noted.  Patient arises from chair without difficulty. Narrow based gait with normal arm swing bilateral , able to walk on heels and toes . Tandem walk is stable. No pronator drift. Rhomberg negative.        Assessment & Plan:  This is a 47 year old female with  1.Hx of lumbar fusion anterior and posterior approach, L4-S1 , 2011, by Dr. Marthenia Rolling  2.SI dysfunction  3.LBP  Plan :  Showed patient exercises to strengthen , protect and stabilize the SI joints in great detail.  Prescribed Flector patches for exacerbation, patient complains about stomach pain when taking oral NSAIDs, advised patient to not take any oral NSAIDs when applying the flector patches.  Refilled Opana 20mg , bid.   Follow up in 1 month

## 2013-05-04 NOTE — Patient Instructions (Signed)
You could try Arnica cream for your bruised leg

## 2013-05-26 ENCOUNTER — Encounter: Admitting: Physical Medicine and Rehabilitation

## 2013-05-27 ENCOUNTER — Ambulatory Visit (HOSPITAL_BASED_OUTPATIENT_CLINIC_OR_DEPARTMENT_OTHER): Admitting: Physical Medicine & Rehabilitation

## 2013-05-27 ENCOUNTER — Encounter: Payer: Self-pay | Admitting: Physical Medicine & Rehabilitation

## 2013-05-27 ENCOUNTER — Encounter: Attending: Physical Medicine & Rehabilitation

## 2013-05-27 VITALS — BP 164/95 | HR 86 | Resp 14 | Ht 67.0 in | Wt 199.2 lb

## 2013-05-27 DIAGNOSIS — M961 Postlaminectomy syndrome, not elsewhere classified: Secondary | ICD-10-CM

## 2013-05-27 DIAGNOSIS — M533 Sacrococcygeal disorders, not elsewhere classified: Secondary | ICD-10-CM | POA: Insufficient documentation

## 2013-05-27 DIAGNOSIS — G894 Chronic pain syndrome: Secondary | ICD-10-CM

## 2013-05-27 DIAGNOSIS — M62838 Other muscle spasm: Secondary | ICD-10-CM

## 2013-05-27 MED ORDER — OXYMORPHONE HCL ER 20 MG PO TB12: 20.0000 mg | ORAL_TABLET | Freq: Two times a day (BID) | ORAL | Status: DC

## 2013-05-27 MED ORDER — BACLOFEN 20 MG PO TABS: ORAL_TABLET | ORAL | Status: DC

## 2013-05-27 MED ORDER — TIZANIDINE HCL 4 MG PO CAPS: 4.0000 mg | ORAL_CAPSULE | Freq: Three times a day (TID) | ORAL | Status: DC

## 2013-05-27 NOTE — Progress Notes (Signed)
Subjective:    Patient ID: Molly Norton, female    DOB: April 21, 1966, 47 y.o.   MRN: [id number]  HPI Last MD visit for SI injection 01/20/13 Last lumbar botox 12/09/2012, 200 Units  Sacroiliac pain is worse than the lumbar spasm. Working FT at [employer]  Pain meds working ok No signs of misuse Last UDS 05/02/2013  Pain Inventory Average Pain 7 Pain Right Now 4 My pain is sharp, burning, stabbing and aching  In the last 24 hours, has pain interfered with the following? General activity 3 Relation with others 3 Enjoyment of life 3 What TIME of day is your pain at its worst? day, evening Sleep (in general) Poor  Pain is worse with: walking, sitting, inactivity, standing and some activites Pain improves with: rest, heat/ice, therapy/exercise, pacing activities, medication and injections Relief from Meds: 6  Mobility walk without assistance walk with assistance use a cane use a walker how many minutes can you walk? 20 ability to climb steps?  yes do you drive?  yes use a wheelchair transfers alone Do you have any goals in this area?  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  yes  Neuro/Psych bladder control problems bowel control problems numbness trouble walking spasms  Prior Studies Any changes since last visit?  no  Physicians involved in your care Any changes since last visit?  no   Family History  Problem Relation Age of Onset  . Hypertension Mother   . Heart disease Father    History   Social History  . Marital Status: Single    Spouse Name: N/A    Number of Children: N/A  . Years of Education: N/A   Social History Main Topics  . Smoking status: Never Smoker   . Smokeless tobacco: Never Used  . Alcohol Use: No  . Drug Use: No  . Sexual Activity: None   Other Topics Concern  . None    Social History Narrative  . None   Past Surgical History  Procedure Laterality Date  . Nasal sinus surgery    . Cesarean section  1990  . Abdominal hysterectomy    . Laparoscopic ovarian cystectomy      post cyst rupture  . Anterior lumbar fusion  12/19/2010    L4-5 and L5-S1   Past Medical History  Diagnosis Date  . DDD (degenerative disc disease)     at L4-5 and L5-S1  . Chronic lower back pain    BP 164/95  Pulse 86  Resp 14  Ht 5\' 7"  (1.702 m)  Wt 199 lb 3.2 oz (90.357 kg)  BMI 31.19 kg/m2  SpO2 98%      Review of Systems  Constitutional: Positive for appetite change and unexpected weight change.  Gastrointestinal: Positive for constipation.  Genitourinary:       Bowel and bladder control problems  Musculoskeletal: Positive for back pain and gait problem.  Neurological: Positive for numbness.       Spasms       Objective:   Physical Exam  Constitutional: She is oriented to person, place, and time.  Musculoskeletal:       Lumbar back: She exhibits decreased range of motion. She exhibits no tenderness and no spasm.  Pain when going from lumbar flexion and extension    Neurological: She is alert and  oriented to person, place, and time. She has normal strength. No sensory deficit.  Reflex Scores:      Patellar reflexes are 0 on the right side and 2+ on the left side.      Achilles reflexes are 0 on the right side and 0 on the left side.         Assessment & Plan:  1.  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  right sacroiliac disorder, recommend reinjection. Will ask for preauth from workers compensation  3. Muscle spasm postoperative chronic and severe, managed by baclofen and Zanaflex. May need repeat Botox in future

## 2013-05-27 NOTE — Patient Instructions (Signed)
preauth for Sacroiliac inj R side  requested

## 2013-06-23 ENCOUNTER — Ambulatory Visit: Payer: Self-pay | Admitting: Cardiology

## 2013-07-04 ENCOUNTER — Other Ambulatory Visit: Payer: Self-pay

## 2013-07-04 ENCOUNTER — Other Ambulatory Visit: Payer: Self-pay | Admitting: Physical Medicine & Rehabilitation

## 2013-07-04 MED ORDER — DICLOFENAC EPOLAMINE 1.3 % TD PTCH: 1.0000 | MEDICATED_PATCH | Freq: Two times a day (BID) | TRANSDERMAL | Status: DC

## 2013-07-05 ENCOUNTER — Ambulatory Visit (HOSPITAL_BASED_OUTPATIENT_CLINIC_OR_DEPARTMENT_OTHER): Admitting: Physical Medicine & Rehabilitation

## 2013-07-05 ENCOUNTER — Encounter: Payer: Self-pay | Admitting: Physical Medicine & Rehabilitation

## 2013-07-05 ENCOUNTER — Encounter: Attending: Physical Medicine & Rehabilitation

## 2013-07-05 VITALS — Ht 67.0 in | Wt 206.0 lb

## 2013-07-05 DIAGNOSIS — M961 Postlaminectomy syndrome, not elsewhere classified: Secondary | ICD-10-CM | POA: Diagnosis present

## 2013-07-05 DIAGNOSIS — M533 Sacrococcygeal disorders, not elsewhere classified: Secondary | ICD-10-CM | POA: Diagnosis not present

## 2013-07-05 MED ORDER — OXYMORPHONE HCL ER 20 MG PO TB12: 20.0000 mg | ORAL_TABLET | Freq: Two times a day (BID) | ORAL | Status: DC

## 2013-07-05 MED ORDER — LIDOCAINE 5 % EX PTCH: 1.0000 | MEDICATED_PATCH | CUTANEOUS | Status: DC

## 2013-07-05 NOTE — Patient Instructions (Addendum)
botox next visit  Lidoderm patch on 7am, off 7 pm Must leave off for 12 hours a day

## 2013-07-05 NOTE — Progress Notes (Signed)
  PROCEDURE RECORD [facility]   Name: Molly Norton DOB:07-31-1965 MRN: [id number]  Date:07/05/2013  Physician: Claudette LawsAndrew Kirsteins, MD    Nurse/CMA: Kelli ChurnLevens, CMA/[place], CMA  Allergies:  Allergies  Allergen Reactions  . Adhesive [Tape]     Steri-strips caused blisters  . Latex Itching and Rash  . Lisinopril     Difficult swallowing  . Relafen [Nabumetone] Swelling  . Codeine Itching  . Robaxin [Methocarbamol] Other (See Comments)    Hospital gave it to her she is unsure of reaction    Consent Signed: yes  Is patient diabetic? no   Pregnant: no LMP: No LMP recorded. Patient has had a hysterectomy. (age 46-55)  Anticoagulants: no Anti-inflammatory: no Antibiotics: no  Procedure: right sacroiliac injection  Position: Prone Start Time: 1:32  End Time: 1:35  Fluoro Time: 10 seconds  RN/CMA Levens,CMA [place], CMA    Time 110 1:37    BP 160/98 182/72    Pulse 76 80    Respirations 14 14    O2 Sat 98 99    S/S 6 6    Pain Level 6/10 6/10     D/C home with Husband Molly Norton, patient A & O X 3, D/C instructions reviewed, and sits independently.

## 2013-07-05 NOTE — Progress Notes (Signed)
Right sacroiliac injection under fluoroscopic guidance  Indication: Right Low back and buttocks pain not relieved by medication management and other conservative care.  Informed consent was obtained after describing risks and benefits of the procedure with the patient, this includes bleeding, bruising, infection, paralysis and medication side effects. The patient wishes to proceed and has given written consent. The patient was placed in a prone position. The lumbar and sacral area was marked and prepped with Betadine. A 25-gauge 1-1/2 inch needle was inserted into the skin and subcutaneous tissue and 1 mL of 1% lidocaine was injected. Then a 25-gauge 3.5 inch spinal needle was inserted under fluoroscopic guidance into the Right sacroiliac joint. AP and lateral images were utilized. Omnipaque 180x0.5 mL under live fluoroscopy demonstrated no intravascular uptake. Then a solution containing one ML of 40 mg per mL depomedrol and 2 ML of 1% lidocaine MPF was injected x1.5 mL. Patient tolerated the procedure well. Post procedure instructions were given. Please see post procedure form. Patient initially had left-sided pain at last visit more right-sided currently.   

## 2013-07-12 ENCOUNTER — Telehealth: Payer: Self-pay | Admitting: Physical Medicine & Rehabilitation

## 2013-07-12 NOTE — Telephone Encounter (Signed)
FYI:  Patient's Botox has again been denied.  I was instructed, again, to have the patient contact her claims examiner to update her case.  I have left a voicemail for the patient explaining this, and have asked her to call back and confirm receipt of this message, since this has been an issue in the past.  I did not cancel her scheduled appointment for 3/3, in hopes that it will be resolved in time for her to have the injection on that date.

## 2013-07-29 ENCOUNTER — Telehealth: Payer: Self-pay | Admitting: Physical Medicine & Rehabilitation

## 2013-07-29 NOTE — Telephone Encounter (Signed)
FYI, update:  Left message for patient to call back regarding Botox injection.  We still have not received an approval.  Patient's 3/3 appointment is cancelled due to MD meeting, and we have submitted another request for approval for March/April.

## 2013-08-09 ENCOUNTER — Ambulatory Visit: Payer: Self-pay | Admitting: Physical Medicine & Rehabilitation

## 2013-08-12 ENCOUNTER — Telehealth: Payer: Self-pay

## 2013-08-12 MED ORDER — OXYMORPHONE HCL ER 20 MG PO TB12: 20.0000 mg | ORAL_TABLET | Freq: Two times a day (BID) | ORAL | Status: DC

## 2013-08-12 NOTE — Telephone Encounter (Signed)
Patient called requesting a refill on Opana. RX printed for Dr. Wynn BankerKirsteins to sign. Will contact patient when ready for pickup.

## 2013-08-15 NOTE — Telephone Encounter (Signed)
Left patient a voicemail informing her that her RX is ready for pickup.

## 2013-09-07 ENCOUNTER — Encounter (HOSPITAL_BASED_OUTPATIENT_CLINIC_OR_DEPARTMENT_OTHER): Payer: Self-pay | Admitting: Emergency Medicine

## 2013-09-07 ENCOUNTER — Emergency Department (HOSPITAL_BASED_OUTPATIENT_CLINIC_OR_DEPARTMENT_OTHER)
Admission: EM | Admit: 2013-09-07 | Discharge: 2013-09-07 | Disposition: A | Discharge status: Home / Self Care | Payer: Federal, State, Local not specified - PPO | Attending: Emergency Medicine | Admitting: Emergency Medicine

## 2013-09-07 DIAGNOSIS — M545 Low back pain, unspecified: Secondary | ICD-10-CM | POA: Insufficient documentation

## 2013-09-07 DIAGNOSIS — Z91040 Latex allergy status: Secondary | ICD-10-CM | POA: Insufficient documentation

## 2013-09-07 DIAGNOSIS — Z8739 Personal history of other diseases of the musculoskeletal system and connective tissue: Secondary | ICD-10-CM | POA: Insufficient documentation

## 2013-09-07 DIAGNOSIS — M549 Dorsalgia, unspecified: Secondary | ICD-10-CM

## 2013-09-07 DIAGNOSIS — Z79899 Other long term (current) drug therapy: Secondary | ICD-10-CM | POA: Insufficient documentation

## 2013-09-07 DIAGNOSIS — G8929 Other chronic pain: Secondary | ICD-10-CM | POA: Insufficient documentation

## 2013-09-07 MED ORDER — KETOROLAC TROMETHAMINE 60 MG/2ML IM SOLN
60.0000 mg | Freq: Once | INTRAMUSCULAR | Status: AC
  Administered 2013-09-07: 60 mg via INTRAMUSCULAR
  Filled 2013-09-07: qty 2

## 2013-09-07 NOTE — Discharge Instructions (Signed)

## 2013-09-07 NOTE — ED Notes (Signed)
Back pain radiating to buttocks since yesterday.

## 2013-09-07 NOTE — ED Provider Notes (Signed)
CSN: [id number]     Arrival date & time 09/07/13  1258 History   First MD Initiated Contact with Patient 09/07/13 1317     Chief Complaint  Patient presents with  . Back Pain     (Consider location/radiation/quality/duration/timing/severity/associated sxs/prior Treatment) Patient is a 48 y.o. female presenting with back pain. The history is provided by the patient. No language interpreter was used.  Back Pain Location:  Generalized Quality:  Aching Radiates to:  Does not radiate Pain severity:  Severe Onset quality:  Unable to specify Timing:  Constant Progression:  Worsening Chronicity:  New Context: physical stress   Relieved by:  Nothing Worsened by:  Nothing tried Ineffective treatments:  Narcotics Pt has chronic back pain.  Pt is on opana.  Pt recently lifting more and sleeping on a different bed due to husband having surgery.  Pt is requesting torodol shot for increased pain  Past Medical History  Diagnosis Date  . DDD (degenerative disc disease)     at L4-5 and L5-S1  . Chronic lower back pain    Past Surgical History  Procedure Laterality Date  . Nasal sinus surgery    . Cesarean section  1990  . Abdominal hysterectomy    . Laparoscopic ovarian cystectomy      post cyst rupture  . Anterior lumbar fusion  12/19/2010    L4-5 and L5-S1   Family History  Problem Relation Age of Onset  . Hypertension Mother   . Heart disease Father    History  Substance Use Topics  . Smoking status: Never Smoker   . Smokeless tobacco: Never Used  . Alcohol Use: No   OB History   Grav Para Term Preterm Abortions TAB SAB Ect Mult Living                 Review of Systems  Musculoskeletal: Positive for back pain.  All other systems reviewed and are negative.      Allergies  Adhesive; Latex; Lisinopril; Relafen; Codeine; and Robaxin  Home Medications   Current Outpatient Rx  Name  Route  Sig  Dispense  Refill  . baclofen (LIORESAL) 20 MG tablet      TAKE 1 TABLET  (20 MG TOTAL) BY MOUTH 4 (FOUR) TIMES DAILY.   120 tablet   5   . Botulinum Toxin Type A, Cosm, 100 UNITS SOLR   Intramuscular   Inject 200 Units into the muscle once.   0.5 each   0   . [facility] SENNA 8.6 MG tablet      TAKE 2 TABLETS (17.2 MG TOTAL) BY MOUTH DAILY AS NEEDED.   120 tablet   6   . diclofenac (FLECTOR) 1.3 % PTCH   Transdermal   Place 1 patch onto the skin 2 (two) times daily.   30 patch   2   . fesoterodine (TOVIAZ) 4 MG TB24   Oral   Take 4 mg by mouth daily.         [name] Heat Wraps (THERMACARE BACK/HIP) MISC   Topical   Apply 1 patch topically daily. Disp #31 on months with 31 days   30 each   6   . hydrochlorothiazide (HYDRODIURIL) 50 MG tablet   Oral   Take 25 mg by mouth daily.          [name] ibuprofen (ADVIL,MOTRIN) 800 MG tablet      TAKE 1 TABLET (800 MG TOTAL) BY MOUTH EVERY 8 (EIGHT) HOURS AS NEEDED.  30 days  supply   90 tablet   5   . lidocaine (LIDODERM) 5 %   Transdermal   Place 1 patch onto the skin daily. Remove & Discard patch within 12 hours or as directed by MD   30 patch   5   . oxymorphone (OPANA ER) 20 MG 12 hr tablet   Oral   Take 1 tablet (20 mg total) by mouth every 12 (twelve) hours.   60 tablet   0     One month supply   . polyethylene glycol powder (GLYCOLAX) powder   Oral   Take 17 g by mouth 2 (two) times daily.   527 g   11   . tiZANidine (ZANAFLEX) 4 MG capsule   Oral   Take 1 capsule (4 mg total) by mouth 3 (three) times daily.   60 capsule   2    BP 181/117  Pulse 80  Temp(Src) 97.9 F (36.6 C) (Oral)  Resp 18  Ht 5' 7.5" (1.715 m)  Wt 210 lb (95.255 kg)  BMI 32.39 kg/m2  SpO2 100% Physical Exam  Nursing note and vitals reviewed. Constitutional: She appears well-developed and well-nourished.  HENT:  Head: Normocephalic and atraumatic.  Neck: Normal range of motion. Neck supple.  Cardiovascular: Normal rate.   Pulmonary/Chest: Effort normal.  Abdominal: Soft.  Musculoskeletal: She exhibits  tenderness.  Neurological: She is alert.  Skin: Skin is warm.  Psychiatric: She has a normal mood and affect.    ED Course  Procedures (including critical care time) Labs Review Labs Reviewed - No data to display Imaging Review No results found.   EKG Interpretation None      MDM   Final diagnoses:  Back pain    Pt given torodol injection.   Pt advised to follow up with her MD for recheck    Elson AreasLeslie K Sofia, PA-C 09/07/13 1359

## 2013-09-07 NOTE — ED Provider Notes (Signed)
Medical screening examination/treatment/procedure(s) were performed by non-physician practitioner and as supervising physician I was immediately available for consultation/collaboration.   EKG Interpretation None        Charles B. Bernette MayersSheldon, MD 09/07/13 1359

## 2013-09-07 NOTE — ED Notes (Signed)
Low back pain radiating to buttocks and right leg.  Pain is described as severe and "spasm like". Denies numbness or tingling in extremities.

## 2013-09-22 ENCOUNTER — Other Ambulatory Visit: Payer: Self-pay | Admitting: Physical Medicine & Rehabilitation

## 2013-10-03 ENCOUNTER — Telehealth: Payer: Self-pay

## 2013-10-03 NOTE — Telephone Encounter (Signed)
Left message for patient to call office to schedule and appointment to get medications refilled.

## 2013-10-03 NOTE — Telephone Encounter (Signed)
Patient called requesting opana refill.  Please advise.

## 2013-10-03 NOTE — Telephone Encounter (Signed)
Needs appt , last appt 1/27! May see Riley LamEunice or Gillis EndsSybil , needs UDS

## 2013-10-04 ENCOUNTER — Encounter: Attending: Registered Nurse | Admitting: Registered Nurse

## 2013-10-04 ENCOUNTER — Encounter: Payer: Self-pay | Admitting: Registered Nurse

## 2013-10-04 VITALS — BP 170/125 | HR 94 | Resp 14 | Ht 67.0 in | Wt 200.0 lb

## 2013-10-04 DIAGNOSIS — R030 Elevated blood-pressure reading, without diagnosis of hypertension: Secondary | ICD-10-CM | POA: Insufficient documentation

## 2013-10-04 DIAGNOSIS — G8929 Other chronic pain: Secondary | ICD-10-CM | POA: Insufficient documentation

## 2013-10-04 DIAGNOSIS — M62838 Other muscle spasm: Secondary | ICD-10-CM | POA: Diagnosis not present

## 2013-10-04 DIAGNOSIS — Z981 Arthrodesis status: Secondary | ICD-10-CM | POA: Insufficient documentation

## 2013-10-04 DIAGNOSIS — G894 Chronic pain syndrome: Secondary | ICD-10-CM

## 2013-10-04 DIAGNOSIS — Z79899 Other long term (current) drug therapy: Secondary | ICD-10-CM

## 2013-10-04 DIAGNOSIS — Z5181 Encounter for therapeutic drug level monitoring: Secondary | ICD-10-CM

## 2013-10-04 DIAGNOSIS — M961 Postlaminectomy syndrome, not elsewhere classified: Secondary | ICD-10-CM

## 2013-10-04 DIAGNOSIS — M533 Sacrococcygeal disorders, not elsewhere classified: Secondary | ICD-10-CM | POA: Insufficient documentation

## 2013-10-04 MED ORDER — OXYMORPHONE HCL ER 20 MG PO TB12: 20.0000 mg | ORAL_TABLET | Freq: Two times a day (BID) | ORAL | Status: DC

## 2013-10-04 NOTE — Progress Notes (Signed)
Subjective:    Patient ID: Molly Norton, female    DOB: 07/19/1965, 48 y.o.   MRN: [id number]  HPI: Mrs. Molly RomeKimberly K Norton is a 48 year old female who returns for follow up for chronic pain and medication refill. She says her pain is located in her lower back. She rates her pain 7. She is still working full time at the [employer]. She arrived to the clinic hypertensive, her blood pressure was rechecked and remains elevated 156/108. She  has not taken her anti-hypertensive's. Educated on the importance of being compliant with medications and verbalized understanding.her current exercise regime is walking.   Pain Inventory Average Pain 5 Pain Right Now 7 My pain is burning, dull, stabbing and aching  In the last 24 hours, has pain interfered with the following? General activity 5 Relation with others 3 Enjoyment of life 7 What TIME of day is your pain at its worst? evening, night Sleep (in general) Poor  Pain is worse with: walking, bending, sitting, inactivity, standing and some activites Pain improves with: rest, heat/ice, therapy/exercise, pacing activities, medication and injections Relief from Meds: 8  Mobility walk without assistance walk with assistance how many minutes can you walk? 20 ability to climb steps?  yes do you drive?  yes use a wheelchair transfers alone Do you have any goals in this area?  yes  Function employed # of hrs/week 40 I need assistance with the following:  meal prep and household duties Do you have any goals in this area?  yes  Neuro/Psych bladder control problems  Prior Studies Any changes since last visit?  no  Physicians involved in your care Any changes since last visit?  no   Family History  Problem Relation Age of Onset  . Hypertension Mother   . Heart disease Father    History   Social History  . Marital Status: Single    Spouse Name: N/A    Number of Children: N/A  . Years of Education: N/A   Social History  Main Topics  . Smoking status: Never Smoker   . Smokeless tobacco: Never Used  . Alcohol Use: No  . Drug Use: No  . Sexual Activity: None   Other Topics Concern  . None   Social History Narrative  . None   Past Surgical History  Procedure Laterality Date  . Nasal sinus surgery    . Cesarean section  1990  . Abdominal hysterectomy    . Laparoscopic ovarian cystectomy      post cyst rupture  . Anterior lumbar fusion  12/19/2010    L4-5 and L5-S1   Past Medical History  Diagnosis Date  . DDD (degenerative disc disease)     at L4-5 and L5-S1  . Chronic lower back pain    BP 170/125  Pulse 94  Resp 14  Ht 5\' 7"  (1.702 m)  Wt 200 lb (90.719 kg)  BMI 31.32 kg/m2  SpO2 99%  Opioid Risk Score:   Fall Risk Score: Low Fall Risk (0-5 points) (pt educated and given brochure on fall risk previously)    Review of Systems  Gastrointestinal: Positive for constipation.  Genitourinary:       Bladder control problems  Musculoskeletal: Positive for back pain.  All other systems reviewed and are negative.      Objective:   Physical Exam  Nursing note and vitals reviewed. Constitutional: She is oriented to person, place, and time. She appears well-developed and well-nourished.  HENT:  Head: Normocephalic.  Neck: Normal range of motion. Neck supple.  Pulmonary/Chest: Effort normal and breath sounds normal.  Musculoskeletal:  Normal Muscle Bulk: Muscle testing reveals: Upper and Lower Extremities: 5/5 Lumbar paraspinal Tenderness L-4- S-1 Walks with narrow base gait  Neurological: She is alert and oriented to person, place, and time.  Skin: Skin is warm and dry.  Psychiatric: She has a normal mood and affect.          Assessment & Plan:  1. 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 Right sacroiliac disorder, recommend reinjection. Still waiting  for pre authorization  from workers  compensation.  F/U in 1 month with Dr. Wynn BankerKirsteins for injection if approved.  20 minutes of face to face patient care time was spent during this visit. All questions were encouraged and answered.

## 2013-10-11 ENCOUNTER — Other Ambulatory Visit: Payer: Self-pay | Admitting: Physical Medicine & Rehabilitation

## 2013-10-11 NOTE — Progress Notes (Signed)
Urine drug screen from 10/04/2013 was consistent. 

## 2013-10-18 ENCOUNTER — Other Ambulatory Visit: Payer: Self-pay | Admitting: Physical Medicine & Rehabilitation

## 2013-10-28 ENCOUNTER — Encounter: Attending: Physical Medicine & Rehabilitation

## 2013-10-28 ENCOUNTER — Encounter: Payer: Self-pay | Admitting: Physical Medicine & Rehabilitation

## 2013-10-28 ENCOUNTER — Ambulatory Visit (HOSPITAL_BASED_OUTPATIENT_CLINIC_OR_DEPARTMENT_OTHER): Admitting: Physical Medicine & Rehabilitation

## 2013-10-28 VITALS — BP 165/99 | HR 100 | Resp 14 | Ht 67.0 in | Wt 196.0 lb

## 2013-10-28 DIAGNOSIS — N319 Neuromuscular dysfunction of bladder, unspecified: Secondary | ICD-10-CM

## 2013-10-28 DIAGNOSIS — M533 Sacrococcygeal disorders, not elsewhere classified: Secondary | ICD-10-CM | POA: Diagnosis not present

## 2013-10-28 DIAGNOSIS — M961 Postlaminectomy syndrome, not elsewhere classified: Secondary | ICD-10-CM | POA: Diagnosis not present

## 2013-10-28 DIAGNOSIS — M62838 Other muscle spasm: Secondary | ICD-10-CM | POA: Diagnosis not present

## 2013-10-28 MED ORDER — OXYMORPHONE HCL ER 20 MG PO TB12: 20.0000 mg | ORAL_TABLET | Freq: Two times a day (BID) | ORAL | Status: DC

## 2013-10-28 MED ORDER — BACLOFEN 20 MG PO TABS: ORAL_TABLET | ORAL | Status: DC

## 2013-10-28 MED ORDER — TIZANIDINE HCL 4 MG PO CAPS: 4.0000 mg | ORAL_CAPSULE | Freq: Three times a day (TID) | ORAL | Status: DC

## 2013-10-28 NOTE — Progress Notes (Signed)
Subjective:    Patient ID: Molly Norton, female    DOB: [birth year], 48 y.o.   MRN: [id number] Last lumbar botox 12/09/2012, 200 Units HPI   Tue Jul 05, 2013 1:52 PM EST  07/05/2013   Right sacroiliac injection under fluoroscopic guidance   Pain meds working ok, increasing low back spasm despite treatment with both baclofen as well as Zanaflex, has had good results with Botox injection in the lumbar extensor paraspinal muscles  No signs of misuse  Patient has intermittent low back spasms which make her stop talking in mid sentence pain Inventory Average Pain 5 Pain Right Now 9 My pain is intermittent, sharp, burning, stabbing, tingling and aching  In the last 24 hours, has pain interfered with the following? General activity 9 Relation with others 6 Enjoyment of life 9 What TIME of day is your pain at its worst? evening, night Sleep (in general) Poor  Pain is worse with: walking, sitting, inactivity, standing and some activites Pain improves with: rest, heat/ice, therapy/exercise, pacing activities, medication, TENS and injections Relief from Meds: na  Mobility walk without assistance walk with assistance use a cane use a walker how many minutes can you walk? 15 ability to climb steps?  yes do you drive?  yes use a wheelchair needs help with transfers Do you have any goals in this area?  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  yes  Neuro/Psych bladder control problems numbness tingling trouble walking spasms  Prior Studies Any changes since last visit?  no  Physicians involved in your care Any changes since last visit?  no   Family History  Problem Relation Age of Onset  . Hypertension Mother   . Heart disease Father    History   Social History  . Marital Status: Single    Spouse Name: N/A    Number of  Children: N/A  . Years of Education: N/A   Social History Main Topics  . Smoking status: Never Smoker   . Smokeless tobacco: Never Used  . Alcohol Use: No  . Drug Use: No  . Sexual Activity: None   Other Topics Concern  . None   Social History Narrative  . None   Past Surgical History  Procedure Laterality Date  . Nasal sinus surgery    . Cesarean section  1990  . Abdominal hysterectomy    . Laparoscopic ovarian cystectomy      post cyst rupture  . Anterior lumbar fusion  12/19/2010    L4-5 and L5-S1   Past Medical History  Diagnosis Date  . DDD (degenerative disc disease)     at L4-5 and L5-S1  . Chronic lower back pain    BP 165/99  Pulse 100  Resp 14  Ht 5\' 7"  (1.702 m)  Wt 196 lb (88.905 kg)  BMI 30.69 kg/m2  SpO2 100%  Opioid Risk Score:   Fall Risk Score: Moderate Fall Risk (6-13 points) (pt educated on fall risk, brochure given to pt previously)    Review of Systems  Genitourinary:       Bladder control problems  Musculoskeletal: Positive for back pain and gait problem.  Neurological: Positive for numbness.       Tingling, spasms  All other systems reviewed and are negative.      Objective:   Physical Exam  Cannot extend to neutral Lumbar flexion is able to touch patella bilateral  Straight leg raising negative bilateral Tenderness to palpation over the right PS IS area greater than left side. Muscle spasm noted from L4-S1 area  Motor strength 4/5 bilateral hip flexors knee extensors ankle dorsiflexor plantar flexor   Gait no toe drag or knee instability     Assessment & Plan:  1.  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  right sacroiliac disorder, recommend reinjection.Not as bad as lumbar paraspinal muscle spasms Did best with every 3 month injections   3. Muscle spasm postoperative chronic and severe,  managed partially by baclofen and Zanaflex.Rec Botox 200 unit to bilateral L4,5,S1 erector spinae muscles, rec every 3 month injections

## 2013-11-06 ENCOUNTER — Other Ambulatory Visit: Payer: Self-pay | Admitting: Physical Medicine & Rehabilitation

## 2013-11-29 ENCOUNTER — Ambulatory Visit: Payer: Worker's Compensation | Admitting: Physical Medicine & Rehabilitation

## 2013-11-29 ENCOUNTER — Encounter

## 2013-12-04 ENCOUNTER — Other Ambulatory Visit: Payer: Self-pay | Admitting: Physical Medicine & Rehabilitation

## 2013-12-15 ENCOUNTER — Telehealth: Payer: Self-pay | Admitting: Physical Medicine & Rehabilitation

## 2013-12-15 NOTE — Telephone Encounter (Signed)
Pt is calling to get a refill on her oxymorphone (OPANA ER) 20 MG 12 hr tablet per pt she is completely out... She is aware of her tenative about on 07.20. 2015 for her botox injection.... She states the last time she had this Rx fill was the beginning of June. Please contact pt at [phone]... Thanks a lot

## 2013-12-16 ENCOUNTER — Other Ambulatory Visit: Payer: Self-pay

## 2013-12-16 MED ORDER — OXYMORPHONE HCL ER 20 MG PO TB12: 20.0000 mg | ORAL_TABLET | Freq: Two times a day (BID) | ORAL | Status: DC

## 2013-12-16 NOTE — Telephone Encounter (Signed)
Opana RX printed for Molly Norton to sign. Will contact patient when ready for pickup. Last fill 5/22. Patient has appt on 7/20. 

## 2013-12-16 NOTE — Telephone Encounter (Signed)
Contacted patient to inform her the Opana RX is ready for pickup.

## 2013-12-16 NOTE — Telephone Encounter (Signed)
Left VM to return call to office.  No name on voicemail so no information left.

## 2013-12-16 NOTE — Telephone Encounter (Signed)
Opana RX printed for [place] to sign. Will contact patient when ready for pickup. Last fill 5/22. Patient has appt on 7/20.

## 2013-12-26 ENCOUNTER — Ambulatory Visit (HOSPITAL_BASED_OUTPATIENT_CLINIC_OR_DEPARTMENT_OTHER): Admitting: Physical Medicine & Rehabilitation

## 2013-12-26 ENCOUNTER — Encounter: Payer: Self-pay | Admitting: Physical Medicine & Rehabilitation

## 2013-12-26 ENCOUNTER — Encounter: Payer: Federal, State, Local not specified - PPO | Attending: Registered Nurse

## 2013-12-26 VITALS — BP 168/104 | HR 78 | Resp 14 | Ht 67.0 in | Wt 191.0 lb

## 2013-12-26 DIAGNOSIS — M961 Postlaminectomy syndrome, not elsewhere classified: Secondary | ICD-10-CM

## 2013-12-26 DIAGNOSIS — M533 Sacrococcygeal disorders, not elsewhere classified: Secondary | ICD-10-CM

## 2013-12-26 DIAGNOSIS — M62838 Other muscle spasm: Secondary | ICD-10-CM

## 2013-12-26 DIAGNOSIS — Z981 Arthrodesis status: Secondary | ICD-10-CM | POA: Insufficient documentation

## 2013-12-26 DIAGNOSIS — G894 Chronic pain syndrome: Secondary | ICD-10-CM

## 2013-12-26 DIAGNOSIS — R030 Elevated blood-pressure reading, without diagnosis of hypertension: Secondary | ICD-10-CM | POA: Insufficient documentation

## 2013-12-26 DIAGNOSIS — G8929 Other chronic pain: Secondary | ICD-10-CM | POA: Insufficient documentation

## 2013-12-26 DIAGNOSIS — R52 Pain, unspecified: Secondary | ICD-10-CM

## 2013-12-26 MED ORDER — OXYMORPHONE HCL ER 20 MG PO TB12: 20.0000 mg | ORAL_TABLET | Freq: Two times a day (BID) | ORAL | Status: DC

## 2013-12-26 MED ORDER — LIDOCAINE 5 % EX PTCH: 1.0000 | MEDICATED_PATCH | CUTANEOUS | Status: DC

## 2013-12-26 MED ORDER — KETOROLAC TROMETHAMINE 60 MG/2ML IM SOLN
60.0000 mg | Freq: Once | INTRAMUSCULAR | Status: AC
  Administered 2013-12-26: 60 mg via INTRAMUSCULAR

## 2013-12-26 NOTE — Patient Instructions (Signed)
[name] is our Education officer, communitynew office manager who is taking over from [place]

## 2013-12-26 NOTE — Progress Notes (Signed)
Subjective:    Patient ID: Molly Norton, female    DOB: [birth year], 48 y.o.   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 General activity 8 Relation with others 5 Enjoyment of life 8 What TIME of day is your pain at its worst? evening Sleep (in general) Poor  Pain is worse with: walking, sitting, inactivity, standing and some activites Pain improves with: rest, heat/ice, therapy/exercise, pacing activities, medication and injections Relief from Meds: 7  Mobility use a cane how many minutes can you walk? 15-20 ability to climb steps?  yes do you drive?  yes use a wheelchair needs help with transfers Do you have any goals in this area?  yes  Function employed # of hrs/week 40 I need assistance with the following:  meal prep, household duties and shopping Do you have any goals in this area?  yes  Neuro/Psych bladder control problems weakness tingling spasms  Prior Studies Any changes since last visit?  no  Physicians involved in your care Any changes since last visit?  no   Family History  Problem Relation Age of Onset  . Hypertension Mother   . Heart disease Father    History   Social History  . Marital Status: Single    Spouse Name: N/A   Number of Children: N/A  . Years of Education: N/A   Social History Main Topics  . Smoking status: Never Smoker   . Smokeless tobacco: Never Used  . Alcohol Use: No  . Drug Use: No  . Sexual Activity: None   Other Topics Concern  . None   Social History Narrative  . None   Past Surgical History  Procedure Laterality Date  . Nasal sinus surgery    . Cesarean section  1990  . Abdominal hysterectomy    . Laparoscopic ovarian cystectomy      post cyst rupture  . Anterior lumbar fusion  12/19/2010    L4-5 and L5-S1   Past Medical History  Diagnosis Date  . DDD (degenerative disc disease)     at L4-5 and L5-S1  . Chronic lower back pain    BP 168/104  Pulse 78  Resp 14  Ht 5\' 7"  (1.702 m)  Wt 191 lb (86.637 kg)  BMI 29.91 kg/m2  SpO2 100%  Opioid Risk Score:   Fall Risk Score: Moderate Fall Risk (6-13 points) (patient educated handout declined)   Review of Systems  Cardiovascular: Positive for leg swelling.  Gastrointestinal: Positive for constipation.  Genitourinary: Positive for difficulty urinating.  Musculoskeletal: Positive for back pain.  Neurological: Positive for weakness.       Tingling, spasm  All other systems reviewed and are negative.      Objective:   Physical  Exam  Nursing note and vitals reviewed. Constitutional: She appears well-developed and well-nourished.  HENT:  Head: Normocephalic and atraumatic.  Eyes: Conjunctivae and EOM are normal. Pupils are equal, round, and reactive to light.  Musculoskeletal:       Lumbar back: She exhibits decreased range of motion, deformity and spasm. She exhibits no tenderness.  Flat back Flexion from hips  Psychiatric: She has a normal mood and affect.          Assessment & Plan:  1. Lumbar postlaminectomy syndrome with chronic pain she's had anterior and posterior fusion's. Has had some relief with chronic Opana will continue current dosage. Monthly opioid monitoring.  2.  severe lumbar muscle  spasms. These appear to be related to number one. Has had only partial relief with has an iodine and baclofen in combination. Has had good relief with botulinum toxin injections 200 units into the lumbar paraspinals at L4-L5 and S1. Last injection one year ago. Botox only lasts about 3 months and really needs to 4 times per year injections.  3. Right sacroiliac pain. This is not as severe as her lumbar spasms at the current time however does benefit from sacroiliac injections every 3 months.

## 2013-12-30 ENCOUNTER — Telehealth: Payer: Self-pay

## 2013-12-30 NOTE — Telephone Encounter (Signed)
Patient called requesting refills on Baclofen, Lidocaine patches, Flector patches, and Senna. After reviewing chart Baclofen last filled on 5/22 with 5 add'l refills. Lidocaine 7/20 with 5 add'l refills. Senna 1/26 with 6 add'l refills, and flector 6/1 with 2 add'l refills.  Attempted to contact patient. Left a voicemail to return call to clinic.

## 2014-01-09 ENCOUNTER — Telehealth: Payer: Self-pay | Admitting: Physical Medicine & Rehabilitation

## 2014-01-09 ENCOUNTER — Other Ambulatory Visit: Payer: Self-pay | Admitting: Family Medicine

## 2014-01-09 DIAGNOSIS — N644 Mastodynia: Secondary | ICD-10-CM

## 2014-01-09 NOTE — Telephone Encounter (Signed)
Just FYI:   If pt calls in: her Botox as of 08.03.2015 at 211 pm was sent to claim processing.. Per Matt in [employer] states it was an oversight on their behalf and to give them two to three business days to work on the approval... I personally called the patient but her voicemail is full... The time was at 214 pm 08.03.2015...[name].[name]. Pt's Botox is on my top of priorities and will look into by the end of th week if I don't hear any thing from [employer].[name]..[name]

## 2014-01-12 ENCOUNTER — Ambulatory Visit
Admission: RE | Admit: 2014-01-12 | Discharge: 2014-01-12 | Disposition: A | Discharge status: Home / Self Care | Source: Ambulatory Visit | Attending: Family Medicine | Admitting: Family Medicine

## 2014-01-12 DIAGNOSIS — N644 Mastodynia: Secondary | ICD-10-CM

## 2014-01-23 NOTE — Telephone Encounter (Signed)
JUST FYI:  I called back to the claim processing today 08.17.2015; they request another form sent and then call to TRY to expedite; the request is being sent again as request. 1222 pm 08.17.2015...   No one , letter, or faxed every came thru the mail

## 2014-01-25 ENCOUNTER — Encounter: Payer: Federal, State, Local not specified - PPO | Admitting: Registered Nurse

## 2014-02-07 ENCOUNTER — Encounter: Payer: Self-pay | Admitting: Registered Nurse

## 2014-02-07 ENCOUNTER — Encounter: Attending: Registered Nurse | Admitting: Registered Nurse

## 2014-02-07 ENCOUNTER — Other Ambulatory Visit: Payer: Self-pay | Admitting: Physical Medicine & Rehabilitation

## 2014-02-07 VITALS — BP 165/106 | HR 82 | Resp 16 | Ht 67.0 in | Wt 188.8 lb

## 2014-02-07 DIAGNOSIS — Z5181 Encounter for therapeutic drug level monitoring: Secondary | ICD-10-CM | POA: Insufficient documentation

## 2014-02-07 DIAGNOSIS — Z79899 Other long term (current) drug therapy: Secondary | ICD-10-CM | POA: Diagnosis not present

## 2014-02-07 DIAGNOSIS — G894 Chronic pain syndrome: Secondary | ICD-10-CM | POA: Diagnosis not present

## 2014-02-07 DIAGNOSIS — M533 Sacrococcygeal disorders, not elsewhere classified: Secondary | ICD-10-CM | POA: Diagnosis not present

## 2014-02-07 DIAGNOSIS — M961 Postlaminectomy syndrome, not elsewhere classified: Secondary | ICD-10-CM | POA: Diagnosis present

## 2014-02-07 DIAGNOSIS — G4700 Insomnia, unspecified: Secondary | ICD-10-CM | POA: Insufficient documentation

## 2014-02-07 MED ORDER — OXYMORPHONE HCL ER 20 MG PO TB12: 20.0000 mg | ORAL_TABLET | Freq: Two times a day (BID) | ORAL | Status: DC

## 2014-02-07 MED ORDER — ZOLPIDEM TARTRATE 5 MG PO TABS: 5.0000 mg | ORAL_TABLET | Freq: Every evening | ORAL | Status: DC | PRN
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
## 2014-02-20 ENCOUNTER — Telehealth: Payer: Self-pay | Admitting: Registered Nurse

## 2014-02-20 MED ORDER — ZOLPIDEM TARTRATE 5 MG PO TABS: 5.0000 mg | ORAL_TABLET | Freq: Every evening | ORAL | Status: DC | PRN
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
## 2014-04-21 ENCOUNTER — Encounter: Payer: Self-pay | Admitting: Registered Nurse

## 2014-04-21 ENCOUNTER — Encounter: Attending: Registered Nurse | Admitting: Registered Nurse

## 2014-04-21 VITALS — BP 140/76 | HR 88 | Resp 14 | Ht 67.5 in | Wt 183.0 lb

## 2014-04-21 DIAGNOSIS — G4700 Insomnia, unspecified: Secondary | ICD-10-CM | POA: Diagnosis not present

## 2014-04-21 DIAGNOSIS — M961 Postlaminectomy syndrome, not elsewhere classified: Secondary | ICD-10-CM | POA: Insufficient documentation

## 2014-04-21 DIAGNOSIS — G8929 Other chronic pain: Secondary | ICD-10-CM | POA: Insufficient documentation

## 2014-04-21 DIAGNOSIS — M62838 Other muscle spasm: Secondary | ICD-10-CM | POA: Diagnosis not present

## 2014-04-21 DIAGNOSIS — Z5181 Encounter for therapeutic drug level monitoring: Secondary | ICD-10-CM

## 2014-04-21 DIAGNOSIS — G894 Chronic pain syndrome: Secondary | ICD-10-CM

## 2014-04-21 DIAGNOSIS — Z760 Encounter for issue of repeat prescription: Secondary | ICD-10-CM | POA: Diagnosis not present

## 2014-04-21 DIAGNOSIS — Z981 Arthrodesis status: Secondary | ICD-10-CM | POA: Diagnosis not present

## 2014-04-21 DIAGNOSIS — Y838 Other surgical procedures as the cause of abnormal reaction of the patient, or of later complication, without mention of misadventure at the time of the procedure: Secondary | ICD-10-CM | POA: Diagnosis not present

## 2014-04-21 DIAGNOSIS — M533 Sacrococcygeal disorders, not elsewhere classified: Secondary | ICD-10-CM | POA: Diagnosis not present

## 2014-04-21 DIAGNOSIS — Z79899 Other long term (current) drug therapy: Secondary | ICD-10-CM

## 2014-04-21 MED ORDER — ZOLPIDEM TARTRATE 5 MG PO TABS: 5.0000 mg | ORAL_TABLET | Freq: Every evening | ORAL | Status: DC | PRN

## 2014-04-21 MED ORDER — OXYMORPHONE HCL ER 20 MG PO TB12: 20.0000 mg | ORAL_TABLET | Freq: Two times a day (BID) | ORAL | Status: DC

## 2014-04-21 MED ORDER — BACLOFEN 20 MG PO TABS: ORAL_TABLET | ORAL | Status: DC
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
## 2014-05-18 ENCOUNTER — Emergency Department (HOSPITAL_BASED_OUTPATIENT_CLINIC_OR_DEPARTMENT_OTHER)
Admission: EM | Admit: 2014-05-18 | Discharge: 2014-05-18 | Disposition: A | Discharge status: Home / Self Care | Payer: Federal, State, Local not specified - PPO | Attending: Emergency Medicine | Admitting: Emergency Medicine

## 2014-05-18 ENCOUNTER — Encounter (HOSPITAL_BASED_OUTPATIENT_CLINIC_OR_DEPARTMENT_OTHER): Payer: Self-pay | Admitting: *Deleted

## 2014-05-18 DIAGNOSIS — G8929 Other chronic pain: Secondary | ICD-10-CM | POA: Diagnosis not present

## 2014-05-18 DIAGNOSIS — Z9889 Other specified postprocedural states: Secondary | ICD-10-CM | POA: Insufficient documentation

## 2014-05-18 DIAGNOSIS — Z791 Long term (current) use of non-steroidal anti-inflammatories (NSAID): Secondary | ICD-10-CM | POA: Insufficient documentation

## 2014-05-18 DIAGNOSIS — M549 Dorsalgia, unspecified: Secondary | ICD-10-CM | POA: Diagnosis present

## 2014-05-18 DIAGNOSIS — Z79899 Other long term (current) drug therapy: Secondary | ICD-10-CM | POA: Insufficient documentation

## 2014-05-18 DIAGNOSIS — Z91040 Latex allergy status: Secondary | ICD-10-CM | POA: Insufficient documentation

## 2014-05-18 DIAGNOSIS — M545 Low back pain: Secondary | ICD-10-CM | POA: Diagnosis not present

## 2014-05-18 MED ORDER — KETOROLAC TROMETHAMINE 60 MG/2ML IM SOLN
60.0000 mg | Freq: Once | INTRAMUSCULAR | Status: AC
  Administered 2014-05-18: 60 mg via INTRAMUSCULAR
  Filled 2014-05-18: qty 2

## 2014-05-18 MED ORDER — HYDROMORPHONE HCL 1 MG/ML IJ SOLN
1.0000 mg | Freq: Once | INTRAMUSCULAR | Status: AC
  Administered 2014-05-18: 1 mg via INTRAMUSCULAR
  Filled 2014-05-18: qty 1

## 2014-05-18 MED ORDER — ONDANSETRON 4 MG PO TBDP
4.0000 mg | ORAL_TABLET | Freq: Once | ORAL | Status: AC
  Administered 2014-05-18: 4 mg via ORAL
  Filled 2014-05-18: qty 1

## 2014-05-18 MED ORDER — HYDROMORPHONE HCL 1 MG/ML IJ SOLN
2.0000 mg | Freq: Once | INTRAMUSCULAR | Status: AC
Start: 2014-05-18 — End: 2014-05-18
  Administered 2014-05-18: 2 mg via INTRAMUSCULAR
  Filled 2014-05-18: qty 2

## 2014-05-18 MED ORDER — DIAZEPAM 5 MG PO TABS
10.0000 mg | ORAL_TABLET | Freq: Once | ORAL | Status: AC
  Administered 2014-05-18: 10 mg via ORAL
  Filled 2014-05-18: qty 2
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
## 2014-05-18 NOTE — ED Notes (Signed)
Hx of back pain and sees pain clinic- states spasms worsening since last night

## 2014-05-19 ENCOUNTER — Encounter: Payer: Self-pay | Admitting: Physical Medicine & Rehabilitation

## 2014-05-19 ENCOUNTER — Encounter: Attending: Registered Nurse

## 2014-05-19 ENCOUNTER — Ambulatory Visit (HOSPITAL_BASED_OUTPATIENT_CLINIC_OR_DEPARTMENT_OTHER): Payer: Worker's Compensation | Admitting: Physical Medicine & Rehabilitation

## 2014-05-19 VITALS — BP 143/89 | HR 92 | Resp 14 | Ht 67.5 in | Wt 186.0 lb

## 2014-05-19 DIAGNOSIS — M961 Postlaminectomy syndrome, not elsewhere classified: Secondary | ICD-10-CM | POA: Insufficient documentation

## 2014-05-19 DIAGNOSIS — Z981 Arthrodesis status: Secondary | ICD-10-CM | POA: Diagnosis not present

## 2014-05-19 DIAGNOSIS — G8929 Other chronic pain: Secondary | ICD-10-CM | POA: Insufficient documentation

## 2014-05-19 DIAGNOSIS — Z760 Encounter for issue of repeat prescription: Secondary | ICD-10-CM | POA: Insufficient documentation

## 2014-05-19 DIAGNOSIS — M62838 Other muscle spasm: Secondary | ICD-10-CM | POA: Insufficient documentation

## 2014-05-19 DIAGNOSIS — M533 Sacrococcygeal disorders, not elsewhere classified: Secondary | ICD-10-CM | POA: Diagnosis not present

## 2014-05-19 DIAGNOSIS — G4700 Insomnia, unspecified: Secondary | ICD-10-CM | POA: Diagnosis not present

## 2014-05-19 DIAGNOSIS — G894 Chronic pain syndrome: Secondary | ICD-10-CM | POA: Diagnosis not present

## 2014-05-19 MED ORDER — OXYMORPHONE HCL ER 20 MG PO TB12: 20.0000 mg | ORAL_TABLET | Freq: Two times a day (BID) | ORAL | Status: DC

## 2014-05-19 NOTE — Progress Notes (Signed)
Subjective:    Patient ID: Molly Norton, female    DOB: September 11, 1965, 48 y.o.   MRN: [id number]  HPI Last lumbar botox 12/09/2012, 200 Units  HPI  Tue Jul 05, 2013 1:52 PM EST   07/05/2013    Right sacroiliac injection under fluoroscopic guidance   Pain meds working ok, increasing low back spasm despite treatment with both baclofen as well as Zanaflex, has had good results with Botox injection in the lumbar extensor paraspinal muscles  No signs of misuse , but did receive dilaudid in ED Difficulty with insurance approval  Uses miralax for constipation r/t opana  Takes zanaflex and baclofen, for severe muscle spasms in back post op  Has breakthrough buttocks pain Radiating into the right buttock and posterior thigh, left side without radiation  see PT for massage  Pain Inventory Average Pain 5 Pain Right Now 2 My pain is sharp, burning, stabbing, tingling and aching  In the last 24 hours, has pain interfered with the following? General activity 10 Relation with others 10 Enjoyment of life 10 What TIME of day is your pain at its worst? morning, evening, night Sleep (in general) Fair  Pain is worse with: walking, sitting, inactivity, standing and some activites Pain improves with: rest, heat/ice, pacing activities, medication, TENS and injections Relief from Meds: 6  Mobility walk with assistance use a cane use a walker how many minutes can you walk? 20 ability to climb steps?  yes do you drive?  yes use a wheelchair needs help with transfers Do you have any goals in this area?  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  yes  Neuro/Psych bladder control problems weakness spasms  Prior Studies Any changes since last visit?  no  Physicians involved in your care Any changes since last visit?  no   Family History  Problem Relation Age of Onset  . Hypertension Mother   . Heart disease Father     History   Social History  . Marital Status: Single    Spouse Name: N/A    Number of Children: N/A  . Years of Education: N/A   Social History Main Topics  . Smoking status: Never Smoker   . Smokeless tobacco: Never Used  . Alcohol Use: No  . Drug Use: No  . Sexual Activity: None   Other Topics Concern  . None   Social History Narrative   Past Surgical History  Procedure Laterality Date  . Nasal sinus surgery    . Cesarean section  1990  . Abdominal hysterectomy    . Laparoscopic ovarian cystectomy      post cyst rupture  . Anterior lumbar fusion  12/19/2010    L4-5 and L5-S1  . Back surgery     Past Medical History  Diagnosis Date  . DDD (degenerative disc disease)     at L4-5 and L5-S1  . Chronic lower back pain    BP 143/89 mmHg  Pulse 92  Resp 14  Ht 5' 7.5" (1.715 m)  Wt 186 lb (84.369 kg)  BMI 28.68 kg/m2  SpO2 98%  Opioid Risk Score:   Fall Risk Score: Low Fall Risk (0-5 points) (pt given pamphlet during todays visit) Review of Systems  Cardiovascular: Positive for leg swelling.  Gastrointestinal: Positive for constipation.  Genitourinary: Positive for urgency and frequency.  Neurological: Positive for weakness.       Spasms  All  other systems reviewed and are negative.      Objective:   Physical Exam  Motor strength is 5/5 bilateral hip flexor and extensor ankle dorsiflexor Tenderness palpation bilateral PSIS no tenderness in the lumbar paraspinal muscles.       Assessment & Plan:  1. 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 right sacroiliac disorder, recommend reinjection.Did best with every 3 month injections   3. Muscle spasm postoperative chronic and severe, managed partially by baclofen and Zanaflex. Improved with PT massage, Rec -1-2 treatments per week

## 2014-05-19 NOTE — Patient Instructions (Signed)
Call our office if you have a flare up, you may come in for Toradol

## 2014-05-31 ENCOUNTER — Telehealth: Payer: Self-pay | Admitting: Physical Medicine & Rehabilitation

## 2014-05-31 NOTE — Telephone Encounter (Signed)
LVM that we are working on [employer] with [employer] and will f/u with her in future.

## 2014-06-06 ENCOUNTER — Other Ambulatory Visit: Payer: Self-pay | Admitting: Family Medicine

## 2014-06-06 ENCOUNTER — Ambulatory Visit
Admission: RE | Admit: 2014-06-06 | Discharge: 2014-06-06 | Disposition: A | Discharge status: Home / Self Care | Payer: Federal, State, Local not specified - PPO | Source: Ambulatory Visit | Attending: Family Medicine | Admitting: Family Medicine

## 2014-06-06 DIAGNOSIS — R079 Chest pain, unspecified: Secondary | ICD-10-CM

## 2014-06-23 ENCOUNTER — Encounter (HOSPITAL_COMMUNITY): Payer: Self-pay | Admitting: Cardiology

## 2014-06-23 DIAGNOSIS — I10 Essential (primary) hypertension: Secondary | ICD-10-CM

## 2014-06-23 DIAGNOSIS — I429 Cardiomyopathy, unspecified: Secondary | ICD-10-CM

## 2014-06-23 DIAGNOSIS — R0600 Dyspnea, unspecified: Secondary | ICD-10-CM | POA: Diagnosis present

## 2014-06-23 DIAGNOSIS — R079 Chest pain, unspecified: Secondary | ICD-10-CM | POA: Insufficient documentation

## 2014-06-23 HISTORY — DX: Essential (primary) hypertension: I10

## 2014-06-23 HISTORY — DX: Cardiomyopathy, unspecified: I42.9

## 2014-06-23 MED ORDER — SODIUM CHLORIDE 0.9 % IJ SOLN: 3.0000 mL | INTRAMUSCULAR | Status: DC | PRN

## 2014-06-23 MED ORDER — SODIUM CHLORIDE 0.9 % IV SOLN
INTRAVENOUS | Status: DC
  Administered 2014-06-27: 09:00:00 via INTRAVENOUS

## 2014-06-23 MED ORDER — SODIUM CHLORIDE 0.9 % IV SOLN: 250.0000 mL | INTRAVENOUS | Status: DC | PRN

## 2014-06-23 MED ORDER — ASPIRIN 81 MG PO CHEW: 81.0000 mg | CHEWABLE_TABLET | ORAL | Status: AC

## 2014-06-23 MED ORDER — SODIUM CHLORIDE 0.9 % IJ SOLN: 3.0000 mL | Freq: Two times a day (BID) | INTRAMUSCULAR | Status: DC
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
## 2014-06-27 ENCOUNTER — Encounter: Attending: Registered Nurse

## 2014-06-27 ENCOUNTER — Ambulatory Visit (HOSPITAL_COMMUNITY)
Admission: RE | Admit: 2014-06-27 | Discharge: 2014-06-28 | Disposition: A | Discharge status: Home / Self Care | Payer: Federal, State, Local not specified - PPO | Source: Ambulatory Visit | Attending: Interventional Cardiology | Admitting: Interventional Cardiology

## 2014-06-27 ENCOUNTER — Encounter (HOSPITAL_COMMUNITY)
Admission: RE | Disposition: A | Discharge status: Home / Self Care | Payer: Self-pay | Source: Ambulatory Visit | Attending: Interventional Cardiology

## 2014-06-27 ENCOUNTER — Ambulatory Visit: Admitting: Physical Medicine & Rehabilitation

## 2014-06-27 ENCOUNTER — Encounter (HOSPITAL_COMMUNITY): Payer: Self-pay | Admitting: *Deleted

## 2014-06-27 DIAGNOSIS — Z6830 Body mass index (BMI) 30.0-30.9, adult: Secondary | ICD-10-CM | POA: Insufficient documentation

## 2014-06-27 DIAGNOSIS — I429 Cardiomyopathy, unspecified: Secondary | ICD-10-CM

## 2014-06-27 DIAGNOSIS — G4700 Insomnia, unspecified: Secondary | ICD-10-CM | POA: Insufficient documentation

## 2014-06-27 DIAGNOSIS — M961 Postlaminectomy syndrome, not elsewhere classified: Secondary | ICD-10-CM | POA: Insufficient documentation

## 2014-06-27 DIAGNOSIS — R5383 Other fatigue: Secondary | ICD-10-CM | POA: Insufficient documentation

## 2014-06-27 DIAGNOSIS — Z981 Arthrodesis status: Secondary | ICD-10-CM | POA: Insufficient documentation

## 2014-06-27 DIAGNOSIS — E785 Hyperlipidemia, unspecified: Secondary | ICD-10-CM | POA: Diagnosis not present

## 2014-06-27 DIAGNOSIS — R002 Palpitations: Secondary | ICD-10-CM | POA: Diagnosis not present

## 2014-06-27 DIAGNOSIS — I10 Essential (primary) hypertension: Secondary | ICD-10-CM | POA: Insufficient documentation

## 2014-06-27 DIAGNOSIS — R0600 Dyspnea, unspecified: Secondary | ICD-10-CM | POA: Insufficient documentation

## 2014-06-27 DIAGNOSIS — M62838 Other muscle spasm: Secondary | ICD-10-CM | POA: Insufficient documentation

## 2014-06-27 DIAGNOSIS — E663 Overweight: Secondary | ICD-10-CM | POA: Insufficient documentation

## 2014-06-27 DIAGNOSIS — G8929 Other chronic pain: Secondary | ICD-10-CM | POA: Insufficient documentation

## 2014-06-27 DIAGNOSIS — I517 Cardiomegaly: Secondary | ICD-10-CM | POA: Insufficient documentation

## 2014-06-27 DIAGNOSIS — G894 Chronic pain syndrome: Secondary | ICD-10-CM | POA: Insufficient documentation

## 2014-06-27 DIAGNOSIS — M533 Sacrococcygeal disorders, not elsewhere classified: Secondary | ICD-10-CM | POA: Insufficient documentation

## 2014-06-27 DIAGNOSIS — R079 Chest pain, unspecified: Secondary | ICD-10-CM | POA: Diagnosis present

## 2014-06-27 DIAGNOSIS — Z760 Encounter for issue of repeat prescription: Secondary | ICD-10-CM | POA: Insufficient documentation

## 2014-06-27 DIAGNOSIS — R931 Abnormal findings on diagnostic imaging of heart and coronary circulation: Secondary | ICD-10-CM | POA: Insufficient documentation

## 2014-06-27 HISTORY — PX: LEFT AND RIGHT HEART CATHETERIZATION WITH CORONARY ANGIOGRAM: SHX5449

## 2014-06-27 HISTORY — DX: Obesity, unspecified: E66.9

## 2014-06-27 HISTORY — DX: Cardiomyopathy, unspecified: I42.9

## 2014-06-27 HISTORY — DX: Essential (primary) hypertension: I10

## 2014-06-27 LAB — POCT I-STAT 3, ART BLOOD GAS (G3+)
ACID-BASE DEFICIT: 2 mmol/L (ref 0.0–2.0)
Bicarbonate: 19.9 mEq/L — ABNORMAL LOW (ref 20.0–24.0)
O2 Saturation: 98 %
PH ART: 7.507 — AB (ref 7.350–7.450)
TCO2: 21 mmol/L (ref 0–100)
pCO2 arterial: 25.1 mmHg — ABNORMAL LOW (ref 35.0–45.0)
pO2, Arterial: 100 mmHg (ref 80.0–100.0)

## 2014-06-27 LAB — POCT I-STAT 3, VENOUS BLOOD GAS (G3P V)
Acid-base deficit: 2 mmol/L (ref 0.0–2.0)
Bicarbonate: 22.1 mEq/L (ref 20.0–24.0)
O2 Saturation: 74 %
PCO2 VEN: 35.3 mmHg — AB (ref 45.0–50.0)
PH VEN: 7.404 — AB (ref 7.250–7.300)
PO2 VEN: 39 mmHg (ref 30.0–45.0)
TCO2: 23 mmol/L (ref 0–100)

## 2014-06-27 SURGERY — LEFT AND RIGHT HEART CATHETERIZATION WITH CORONARY ANGIOGRAM
Anesthesia: LOCAL

## 2014-06-27 MED ORDER — IBUPROFEN 600 MG PO TABS
600.0000 mg | ORAL_TABLET | Freq: Four times a day (QID) | ORAL | Status: DC | PRN
  Filled 2014-06-27: qty 1

## 2014-06-27 MED ORDER — VERAPAMIL HCL 2.5 MG/ML IV SOLN
INTRAVENOUS | Status: AC
  Filled 2014-06-27: qty 2

## 2014-06-27 MED ORDER — POLYETHYLENE GLYCOL 3350 17 GM/SCOOP PO POWD
1.0000 | Freq: Once | ORAL | Status: DC | PRN
  Filled 2014-06-27: qty 255

## 2014-06-27 MED ORDER — HEPARIN (PORCINE) IN NACL 2-0.9 UNIT/ML-% IJ SOLN
INTRAMUSCULAR | Status: AC
Start: 2014-06-27 — End: 2014-06-27
  Filled 2014-06-27: qty 1000

## 2014-06-27 MED ORDER — SODIUM CHLORIDE 0.9 % IV SOLN
INTRAVENOUS | Status: AC
  Administered 2014-06-27: 11:00:00 via INTRAVENOUS

## 2014-06-27 MED ORDER — MIDAZOLAM HCL 2 MG/2ML IJ SOLN
INTRAMUSCULAR | Status: AC
  Filled 2014-06-27: qty 2

## 2014-06-27 MED ORDER — LIDOCAINE 5 % EX PTCH
1.0000 | MEDICATED_PATCH | CUTANEOUS | Status: DC
  Administered 2014-06-28: 1 via TRANSDERMAL
  Filled 2014-06-27 (×2): qty 1

## 2014-06-27 MED ORDER — BACLOFEN 20 MG PO TABS
20.0000 mg | ORAL_TABLET | Freq: Four times a day (QID) | ORAL | Status: DC
  Administered 2014-06-27 – 2014-06-28 (×3): 20 mg via ORAL
  Filled 2014-06-27 (×8): qty 1

## 2014-06-27 MED ORDER — ACETAMINOPHEN 325 MG PO TABS
650.0000 mg | ORAL_TABLET | ORAL | Status: DC | PRN
  Administered 2014-06-27: 650 mg via ORAL
  Filled 2014-06-27: qty 2

## 2014-06-27 MED ORDER — MORPHINE SULFATE ER 60 MG PO TBCR
60.0000 mg | EXTENDED_RELEASE_TABLET | Freq: Two times a day (BID) | ORAL | Status: DC
  Administered 2014-06-27 – 2014-06-28 (×3): 60 mg via ORAL
  Filled 2014-06-27 (×3): qty 1

## 2014-06-27 MED ORDER — HEPARIN SODIUM (PORCINE) 1000 UNIT/ML IJ SOLN
INTRAMUSCULAR | Status: AC
  Filled 2014-06-27: qty 1

## 2014-06-27 MED ORDER — FENTANYL CITRATE 0.05 MG/ML IJ SOLN
INTRAMUSCULAR | Status: AC
  Filled 2014-06-27: qty 2

## 2014-06-27 MED ORDER — HYDRALAZINE HCL 25 MG PO TABS
25.0000 mg | ORAL_TABLET | Freq: Two times a day (BID) | ORAL | Status: DC
  Administered 2014-06-27 – 2014-06-28 (×2): 25 mg via ORAL
  Filled 2014-06-27 (×4): qty 1

## 2014-06-27 MED ORDER — ONDANSETRON HCL 4 MG/2ML IJ SOLN
4.0000 mg | Freq: Four times a day (QID) | INTRAMUSCULAR | Status: DC | PRN
  Administered 2014-06-28: 4 mg via INTRAVENOUS
  Filled 2014-06-27: qty 2

## 2014-06-27 MED ORDER — LIDOCAINE HCL (PF) 1 % IJ SOLN
INTRAMUSCULAR | Status: AC
  Filled 2014-06-27: qty 30

## 2014-06-27 MED ORDER — HYDROCHLOROTHIAZIDE 25 MG PO TABS
25.0000 mg | ORAL_TABLET | Freq: Every day | ORAL | Status: DC
  Administered 2014-06-28: 25 mg via ORAL
  Filled 2014-06-27 (×2): qty 1

## 2014-06-27 MED ORDER — DICLOFENAC EPOLAMINE 1.3 % TD PTCH
1.0000 | MEDICATED_PATCH | Freq: Two times a day (BID) | TRANSDERMAL | Status: DC
  Administered 2014-06-27: 1 via TRANSDERMAL
  Filled 2014-06-27 (×4): qty 1

## 2014-06-27 MED ORDER — ZOLPIDEM TARTRATE 5 MG PO TABS
5.0000 mg | ORAL_TABLET | Freq: Every evening | ORAL | Status: DC | PRN
  Administered 2014-06-27: 5 mg via ORAL
  Filled 2014-06-27: qty 1

## 2014-06-27 MED ORDER — NITROGLYCERIN 1 MG/10 ML FOR IR/CATH LAB
INTRA_ARTERIAL | Status: AC
  Filled 2014-06-27: qty 10

## 2014-06-27 MED ORDER — POLYETHYLENE GLYCOL 3350 17 G PO PACK
17.0000 g | PACK | Freq: Every day | ORAL | Status: DC | PRN
  Filled 2014-06-27 (×2): qty 1

## 2014-06-27 MED ORDER — SENNOSIDES 8.6 MG PO TABS
2.0000 | ORAL_TABLET | Freq: Every evening | ORAL | Status: DC | PRN
  Filled 2014-06-27: qty 2

## 2014-06-27 MED ORDER — TIZANIDINE HCL 2 MG PO TABS
4.0000 mg | ORAL_TABLET | Freq: Three times a day (TID) | ORAL | Status: DC
  Administered 2014-06-27 – 2014-06-28 (×2): 4 mg via ORAL
  Filled 2014-06-27 (×6): qty 2

## 2014-06-27 NOTE — Progress Notes (Signed)
Patient called me to room to state she is upset that Dr. Eldridge DaceVaranasi shared too much information regarding the heart catheterization with her husband (they are separated). Per patient she only wanted the doctor to state if the procedure went well and that she was ok. Per patient the doctor shared the details of the procedure beyond what she had wanted him to. Patient requested I call the doctor to tell him that she doesn't wish to see him again, she is very upset, and for the doctor to forward the results of her procedure to the doctor that ordered the procedure. Information relayed to a nurse in the cath lab who relayed the information to Dr. Eldridge DaceVaranasi who was standing nearby.  Will continue to monitor. Asher Muir- Mirielle Byrum,RN

## 2014-06-27 NOTE — Progress Notes (Signed)
Dr. Eldridge DaceVaranasi notified of patient's runs of PVCs. No new orders at this time. Patient asymptomatic and resting comfortably in room. Will continue to monitor. Asher Muir- Disha Cottam,RN

## 2014-06-27 NOTE — Interval H&P Note (Signed)
Cath Lab Visit (complete for each Cath Lab visit)  Clinical Evaluation Leading to the Procedure:   ACS: No.  Non-ACS:    Anginal Classification: CCS III  Anti-ischemic medical therapy: No Therapy  Non-Invasive Test Results: No non-invasive testing performed  Prior CABG: No previous CABG  Ischemic Symptoms? CCS III (Marked limitation of ordinary activity) Anti-ischemic Medical Therapy? No Therapy Non-invasive Test Results? No non-invasive testing performed Prior CABG? No Previous CABG   Patient Information:   1-2V CAD, no prox LAD  A (7)  Indication: 20; Score: 7   Patient Information:   1-2V-CAD with DS 50-60% With No FFR, No IVUS  I (3)  Indication: 21; Score: 3   Patient Information:   1-2V-CAD with DS 50-60% With FFR  A (7)  Indication: 22; Score: 7   Patient Information:   1-2V-CAD with DS 50-60% With FFR>0.8, IVUS not significant  I (2)  Indication: 23; Score: 2   Patient Information:   3V-CAD without LMCA With Abnormal LV systolic function  A (9)  Indication: 48; Score: 9   Patient Information:   LMCA-CAD  A (9)  Indication: 49; Score: 9   Patient Information:   2V-CAD with prox LAD PCI  A (7)  Indication: 62; Score: 7   Patient Information:   2V-CAD with prox LAD CABG  A (8)  Indication: 62; Score: 8   Patient Information:   3V-CAD without LMCA With Low CAD burden(i.e., 3 focal stenoses, low SYNTAX score) PCI  A (7)  Indication: 63; Score: 7   Patient Information:   3V-CAD without LMCA With Low CAD burden(i.e., 3 focal stenoses, low SYNTAX score) CABG  A (9)  Indication: 63; Score: 9   Patient Information:   3V-CAD without LMCA E06c - Intermediate-high CAD burden (i.e., multiple diffuse lesions, presence of CTO, or high SYNTAX score) PCI  U (4)  Indication: 64; Score: 4   Patient Information:   3V-CAD without LMCA E06c - Intermediate-high CAD burden (i.e., multiple diffuse lesions, presence of  CTO, or high SYNTAX score) CABG  A (9)  Indication: 64; Score: 9   Patient Information:   LMCA-CAD With Isolated LMCA stenosis  PCI  U (6)  Indication: 65; Score: 6   Patient Information:   LMCA-CAD With Isolated LMCA stenosis  CABG  A (9)  Indication: 65; Score: 9   Patient Information:   LMCA-CAD Additional CAD, low CAD burden (i.e., 1- to 2-vessel additional involvement, low SYNTAX score) PCI  U (5)  Indication: 66; Score: 5   Patient Information:   LMCA-CAD Additional CAD, low CAD burden (i.e., 1- to 2-vessel additional involvement, low SYNTAX score) CABG  A (9)  Indication: 66; Score: 9   Patient Information:   LMCA-CAD Additional CAD, intermediate-high CAD burden (i.e., 3-vessel involvement, presence of CTO, or high SYNTAX score) PCI  I (3)  Indication: 67; Score: 3   Patient Information:   LMCA-CAD Additional CAD, intermediate-high CAD burden (i.e., 3-vessel involvement, presence of CTO, or high SYNTAX score) CABG  A (9)  Indication: 67; Score: 9     History and Physical Interval Note:  06/27/2014 9:46 AM  Molly Norton  has presented today for surgery, with the diagnosis of sob, cp  The various methods of treatment have been discussed with the patient and family. After consideration of risks, benefits and other options for treatment, the patient has consented to  Procedure(s): LEFT AND RIGHT HEART CATHETERIZATION WITH CORONARY ANGIOGRAM (N/A) as a surgical intervention .  The patient's history has been reviewed, patient examined, no change in status, stable for surgery.  I have reviewed the patient's chart and labs.  Questions were answered to the patient's satisfaction.     Lj Miyamoto S.

## 2014-06-27 NOTE — CV Procedure (Addendum)
    PROCEDURE:  Right and Left heart catheterization with selective coronary angiography, left ventriculogram.  INDICATIONS:  Abnormal echocardiogram, chest pain, dyspnea  The risks, benefits, and details of the procedure were explained to the patient.  The patient verbalized understanding and wanted to proceed.  Informed written consent was obtained.  PROCEDURE TECHNIQUE:  After Xylocaine anesthesia a 53F sheath was placed in the right radial artery with a single anterior needle wall stick. IV heparin was given.  Right coronary angiography was done using a Judkins R4 guide catheter.   Left coronary angiography was done using a Judkins L3.5 guide catheter. Left ventriculography was done using a pigtail catheter. A TR band was used for hemostasis.   CONTRAST:  Total of 80 cc.  COMPLICATIONS:  None.    HEMODYNAMICS:  Aortic pressure was 149/95; LV pressure was 151/10; LVEDP 13.  There was no gradient between the left ventricle and aorta.  Right atrial pressure 7 over 5, mean right atrial pressure 4 mmHg; RV pressure 30 over 2, RV EDP 3 mmHg; pulmonary artery pressure 25/7, mean PA pressure 17 mmHg; pulmonary capillary wedge pressure 11 over 11, mean pulmonary capillary wedge pressure 9 mmHg; PA saturation 74%, aortic saturation 98%.  Cardiac output 6.8 L/m. Cardiac index 3.45.  ANGIOGRAPHIC DATA:   The left main coronary artery is a large vessel which is angiographically normal.  The left anterior descending artery is a large vessel which wraps around the apex. There is a large first diagonal which is widely patent. The LAD appears angiographically normal.  The left circumflex artery is a medium-size vessel which is angiographically normal. There is a small obtuse marginal vessel is patent.  The right coronary artery is a large, dominant vessel which appears angiographically normal. There is a large posterior descending artery which is normal. There is a large posterior lateral artery which is  angiographically normal.  LEFT VENTRICULOGRAM:  Left ventricular angiogram was done in the 30 RAO projection and revealed normal left ventricular wall motion and low normal systolic function with an estimated ejection fraction of 50%.  LVEDP was 13 mmHg.  IMPRESSIONS:  1. Normal left main coronary artery. 2. Normal left anterior descending artery and its branches. 3. Normal left circumflex artery and its branches. 4. Normal right coronary artery. 5. Low normal left ventricular systolic function.  LVEDP 13 mmHg.  Ejection fraction 50%. 6.   Normal pulmonary artery pressures as noted above.  PA saturation 74%, aortic saturation 98%.  Cardiac output 6.8 L/m. Cardiac index 3.45.  RECOMMENDATION:  Patient reports that she and Dr. Donnie Ahoilley had an arrangement that she would be admitted postprocedure due to some extenuating social circumstances. She states that Dr. Donnie Ahoilley told her to tell the physician performing the cardiac cath to keep her overnight. We will plan for overnight monitoring. She should be able to go home tomorrow barring any complications. Further cardiac management per Dr. Donnie Ahoilley.

## 2014-06-28 DIAGNOSIS — I429 Cardiomyopathy, unspecified: Secondary | ICD-10-CM | POA: Diagnosis not present

## 2014-06-28 NOTE — Progress Notes (Signed)
Pt discharged home Discharge instructions given & reviewed Education discussed  IV dc'd  Tele dc'd  Pt discharged via ambulation with discharge tech, pt waiting for transportation in the lounge.  All pt belongs at side.  Louie BunWilson,Francheska Villeda S 1:12 PM

## 2014-06-28 NOTE — Progress Notes (Signed)
    SUBJECTIVE:  No acute complaints.  No SOB.  No pain   PHYSICAL EXAM Filed Vitals:   06/27/14 1331 06/27/14 1446 06/27/14 2106 06/28/14 0557  BP: 126/61 122/69 126/70 105/65  Pulse: 75 98 97 71  Temp: 98.7 F (37.1 C) 98.7 F (37.1 C) 98.6 F (37 C) 98.2 F (36.8 C)  TempSrc: Oral Oral Oral Oral  Resp: 16 16 18 18   Height:      Weight:      SpO2: 100% 99% 99% 98%   General:  No distress Lungs:  Clear Heart:  RRR Abdomen:  Positive bowel sounds, no rebound no guarding Extremities:  Right wrist without bruising or hematoma.    LABS: No results found for: TROPONINI No results found for this or any previous visit (from the past 24 hour(s)).  Intake/Output Summary (Last 24 hours) at 06/28/14 1011 Last data filed at 06/27/14 1802  Gross per 24 hour  Intake    400 ml  Output      0 ml  Net    400 ml     ASSESSMENT AND PLAN:  CARDIOMYOPATHY:     EF 50% on cath with normal coronaries.  No change to therapy.  Follow up with Dr. Donnie Ahoilley.    Fayrene FearingJames Houston Physicians' Hospitalochrein 06/28/2014 10:11 AM

## 2014-06-28 NOTE — Discharge Summary (Signed)
Physician Discharge Summary  Patient ID: Molly RomeKimberly K Campa MRN: [id number] DOB/AGE: Oct 22, 1965 49 y.o.  Admit date: 06/27/2014 Discharge date: 06/28/2014  Admission Diagnoses: Systolic Dysfunction + Chest Pain  Discharge Diagnoses:  Active Problems:   Cardiomyopathy   Dyspnea   Abnormal echocardiogram   Discharged Condition: stable  Hospital Course: The patient is a 49 yo female, followed by Dr. Donnie Ahoilley. She was recently seen in his office with complaints of dyspnea with exertion, severe fatigue, and a vague squeezing sharp chest pain. An event monitor was ordered which demonstrated no serious arrhthymias. An echocardiogram done on 1/11 and showed ejection fraction of around 35% with global hypokinesis and mild to moderate LVH. In light of her LV dysfunction, it was recommended that she have a right and left heart catheterization. She was admitted 06/27/14 to undergo the planned procedure. The procedure was performed by Dr. Eldridge DaceVaranasi. This revealed normal coronaries. EF by cath was 50%. She was also noted to have normal pulmonary pressures. She left the cath lab in stable condition and was monitored overnight. She had no post cath complications. Cath access site remained stable. She had no difficulties ambulating. Vital signs remained stable. She was last seen and examined by Dr. Antoine PocheHochrein who determined she was stable for discharge home. He recommended no changes in her medications. Post hospital f/u has been arranged with Dr. Donnie Ahoilley 07/13/14.   Consults: None  Significant Diagnostic Studies:   [facility]ENTRICUL[facility]: Left ventricular angiogram was done in the 30 RAO projection and revealed normal left ventricular wall motion and low normal systolic function with an estimated ejection fraction of 50%. LVEDP was 13 mmHg.  IMPRESSIONS:  2. Normal left main coronary artery. 3. Normal left anterior descending artery and its branches. 4. Normal left circumflex artery and its  branches. 5. Normal right coronary artery. 6. Low normal left ventricular systolic function. LVEDP 13 mmHg. Ejection fraction 50%. 6. Normal pulmonary artery pressures as noted above. PA saturation 74%, aortic saturation 98%. Cardiac output 6.8 L/m. Cardiac index 3.45.  Treatments: See Hospital Course  Discharge Exam: Blood pressure 136/78, pulse 71, temperature 98.2 F (36.8 C), temperature source Oral, resp. rate 18, height 5' 7.5" (1.715 m), weight 183 lb (83.008 kg), SpO2 98 %.   Disposition: 01-Home or Self Care     Medication List    TAKE these medications        baclofen 20 MG tablet  Commonly known as:  LIORESAL  TAKE 1 TABLET (20 MG TOTAL) BY MOUTH 4 (FOUR) TIMES DAILY.     [facility] SENNA 8.6 MG tablet  Generic drug:  senna  TAKE 2 TABLETS (17.2 MG TOTAL) BY MOUTH DAILY AS NEEDED.     FLECTOR 1.3 % Ptch  Generic drug:  diclofenac  PLACE 1 PATCH ONTO THE SKIN 2 (TWO) TIMES DAILY.     hydrALAZINE 25 MG tablet  Commonly known as:  APRESOLINE  Take 25 mg by mouth 2 (two) times daily.     hydrochlorothiazide 50 MG tablet  Commonly known as:  HYDRODIURIL  Take 25 mg by mouth daily.     ibuprofen 800 MG tablet  Commonly known as:  ADVIL,MOTRIN  TAKE 1 TABLET (800 MG TOTAL) BY MOUTH EVERY 8 (EIGHT) HOURS AS NEEDED. 30 DAYS SUPPLY     lidocaine 5 %  Commonly known as:  LIDODERM  Place 1 patch onto the skin daily. Remove & Discard patch within 12 hours or as directed by MD     oxymorphone 20  MG 12 hr tablet  Commonly known as:  OPANA ER  Take 1 tablet (20 mg total) by mouth every 12 (twelve) hours.     polyethylene glycol powder powder  Commonly known as:  GLYCOLAX/MIRALAX  TAKE 17 G BY MOUTH 2 (TWO) TIMES DAILY.     THERMACARE BACK/HIP Misc  APPLY 1 PATCH EVERY DAY     tiZANidine 4 MG capsule  Commonly known as:  ZANAFLEX  TAKE ONE CAPSULE BY MOUTH 3 TIMES A DAY     zolpidem 5 MG tablet  Commonly known as:  AMBIEN  Take 1 tablet (5 mg total) by mouth  at bedtime as needed for sleep. Take one tablet at bedtime when needed       Follow-up Information    Follow up with Darden Palmer, MD On 07/13/2014.   Specialty:  Cardiology   Why:  1:45 pm    Contact information:   [address] [place] [address] [phone]       TIME SPENT ON DISCHARGE, INCLUDING PHYSICIAN TIME: >30 MINUTES   Signed: Robbie Lis 06/28/2014, 11:18 AM

## 2014-07-22 ENCOUNTER — Other Ambulatory Visit: Payer: Self-pay | Admitting: Physical Medicine & Rehabilitation

## 2014-07-30 ENCOUNTER — Other Ambulatory Visit: Payer: Self-pay | Admitting: Physician Assistant

## 2014-08-31 ENCOUNTER — Telehealth: Payer: Self-pay | Admitting: *Deleted

## 2014-08-31 ENCOUNTER — Other Ambulatory Visit: Payer: Self-pay | Admitting: *Deleted

## 2014-08-31 MED ORDER — ZOLPIDEM TARTRATE 5 MG PO TABS: 5.0000 mg | ORAL_TABLET | Freq: Every evening | ORAL | Status: DC | PRN

## 2014-08-31 NOTE — Telephone Encounter (Signed)
Has an apptointment with Eunice on 09/04/14 but [place] a [place].  Called to pharmacy.

## 2014-09-04 ENCOUNTER — Encounter: Attending: Registered Nurse | Admitting: Registered Nurse

## 2014-09-04 ENCOUNTER — Encounter: Payer: Self-pay | Admitting: Registered Nurse

## 2014-09-04 VITALS — BP 133/86 | HR 98 | Resp 14

## 2014-09-04 DIAGNOSIS — Z981 Arthrodesis status: Secondary | ICD-10-CM | POA: Insufficient documentation

## 2014-09-04 DIAGNOSIS — Z5181 Encounter for therapeutic drug level monitoring: Secondary | ICD-10-CM

## 2014-09-04 DIAGNOSIS — Z760 Encounter for issue of repeat prescription: Secondary | ICD-10-CM | POA: Diagnosis not present

## 2014-09-04 DIAGNOSIS — M62838 Other muscle spasm: Secondary | ICD-10-CM | POA: Diagnosis not present

## 2014-09-04 DIAGNOSIS — Z79899 Other long term (current) drug therapy: Secondary | ICD-10-CM

## 2014-09-04 DIAGNOSIS — M961 Postlaminectomy syndrome, not elsewhere classified: Secondary | ICD-10-CM

## 2014-09-04 DIAGNOSIS — G894 Chronic pain syndrome: Secondary | ICD-10-CM | POA: Diagnosis not present

## 2014-09-04 DIAGNOSIS — G8929 Other chronic pain: Secondary | ICD-10-CM | POA: Diagnosis present

## 2014-09-04 DIAGNOSIS — G4700 Insomnia, unspecified: Secondary | ICD-10-CM | POA: Diagnosis not present

## 2014-09-04 DIAGNOSIS — M533 Sacrococcygeal disorders, not elsewhere classified: Secondary | ICD-10-CM | POA: Diagnosis not present

## 2014-09-04 MED ORDER — CYCLOBENZAPRINE HCL 10 MG PO TABS: 10.0000 mg | ORAL_TABLET | Freq: Every day | ORAL | Status: DC

## 2014-09-04 MED ORDER — TIZANIDINE HCL 4 MG PO CAPS: 4.0000 mg | ORAL_CAPSULE | Freq: Two times a day (BID) | ORAL | Status: DC | PRN

## 2014-09-04 MED ORDER — OXYMORPHONE HCL ER 20 MG PO TB12: 20.0000 mg | ORAL_TABLET | Freq: Two times a day (BID) | ORAL | Status: DC

## 2014-09-04 NOTE — Progress Notes (Signed)
Subjective:    Patient ID: Molly Norton, female    DOB: Apr 02, 1966, 49 y.o.   MRN: [id number]  HPI: Molly Norton is a 49 year old female who returns for follow up for chronic pain and medication refill. She says her pain is located in her lower back and right buttock ( sacrum). She states " it feels as though the the baclofen is not working, discontinued and flexeril ordered. She rates her pain 7. Her current exercise regime is walking and attending physical therapy twice a week. She was getting out of the bed on 09/02/14 she swung her right leg over and develop a shooting pain into her lower back and right calf. She didn't seek medical attention using medication and ice therapy. In January she was experiencing weakness she followed with her cardiologist Dr. Arlyn Leakilly. She had a left and right heart catheterization with coronary angiogram on 06/27/14. She says she was diagnosed with cardiomyopathy and CHF.  Pain Inventory Average Pain 5 Pain Right Now 7 My pain is sharp, burning, dull and aching  In the last 24 hours, has pain interfered with the following? General activity 8 Relation with others 4 Enjoyment of life 4 What TIME of day is your pain at its worst? morning, evening, night Sleep (in general) Poor  Pain is worse with: walking, bending, sitting, inactivity and standing Pain improves with: rest, heat/ice, therapy/exercise, pacing activities, medication and injections Relief from Meds: 8  Mobility walk without assistance Do you have any goals in this area?  yes  Function employed # of hrs/week 40 Do you have any goals in this area?  yes  Neuro/Psych bladder control problems weakness tingling spasms  Prior Studies Any changes since last visit?  no  Physicians involved in your care Any changes since last visit?  no   Family History  Problem Relation Age of Onset  . Hypertension Mother   . Heart disease Father    History   Social History  .  Marital Status: Married    Spouse Name: N/A  . Number of Children: N/A  . Years of Education: N/A   Social History Main Topics  . Smoking status: Never Smoker   . Smokeless tobacco: Never Used  . Alcohol Use: No  . Drug Use: No  . Sexual Activity: Not on file   Other Topics Concern  . None   Social History Narrative   Separated, lives alone, marital stress in past.    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 Nasal sinus surgery    . Cesarean section  1990  . Abdominal hysterectomy    . Laparoscopic ovarian cystectomy      post cyst rupture  . Anterior lumbar fusion  12/19/2010    L4-5 and L5-S1  . Breast lumpectomy    . Left and right heart catheterization with coronary angiogram N/A 06/27/2014    Procedure: LEFT AND RIGHT HEART CATHETERIZATION WITH CORONARY ANGIOGRAM;  Surgeon: Corky CraftsJayadeep S Varanasi, MD;  [facility];  Service: Cardiovascular;  Laterality: N/A;   Past Medical History  Diagnosis Date  . DDD (degenerative disc disease)     at L4-5 and L5-S1  . Chronic lower back pain   . Cardiomyopathy 06/23/2014  . Essential hypertension 06/23/2014  . Obesity (BMI 30-39.9)    BP 133/86 mmHg  Pulse 98  Resp 14  SpO2 98%  Opioid Risk Score:  Fall Risk Score: Low Fall Risk (0-5 points)`1  Depression screen PHQ 2/9  Depression screen PHQ 2/9 09/04/2014  Decreased Interest 0  Down, Depressed, Hopeless 0  PHQ - 2 Score 0  Altered sleeping 2  Tired, decreased energy 2  Change in appetite 0  Feeling bad or failure about yourself  0  Trouble concentrating 0  Moving slowly or fidgety/restless 0  Suicidal thoughts 0  PHQ-9 Score 4     Review of Systems  Gastrointestinal: Positive for constipation.  Genitourinary: Positive for urgency.  Neurological: Positive for weakness.       Tingling Spasms   All other systems reviewed and are negative.      Objective:   Physical Exam    Constitutional: She is oriented to person, place, and time. She appears well-developed and well-nourished.  HENT:  Head: Normocephalic and atraumatic.  Neck: Normal range of motion. Neck supple.  Cardiovascular: Normal rate and regular rhythm.   Pulmonary/Chest: Effort normal and breath sounds normal.  Musculoskeletal:  Normal Muscle Bulk and Muscle Testing Reveals: Upper Extremities: Full ROM and Muscle Strength 5/5 Back without spinal or paraspinal tenderness Right Gluteal Maximus Tenderness Lower Extremities: Full ROM and Muscle Strength 5/5 Arises from chair with ease Narrow Based Gait   Neurological: She is alert and oriented to person, place, and time.  Skin: Skin is warm and dry.  Psychiatric: She has a normal mood and affect.  Nursing note and vitals reviewed.         Assessment & Plan:  1. 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 Right sacroiliac disorder: Awaiting on paperwork for approval from workman[employer] compensation for lumbar botox 3. Insomnia: Continued: Ambien 5 mg one tablet at HS prn  4. Muscle Spasm: Discontinued Baclofen RX: Flexeril 10 mg HS and Continue Zanaflex.   20 minutes of face to face patient care time was spent during this visit. All questions were encouraged and answered.  F/U in 1 month

## 2014-09-27 ENCOUNTER — Other Ambulatory Visit: Payer: Self-pay | Admitting: Physical Medicine & Rehabilitation

## 2014-09-28 ENCOUNTER — Other Ambulatory Visit: Payer: Self-pay | Admitting: Physical Medicine & Rehabilitation

## 2014-10-06 ENCOUNTER — Ambulatory Visit: Admitting: Physical Medicine & Rehabilitation

## 2014-10-06 ENCOUNTER — Encounter

## 2014-10-27 ENCOUNTER — Telehealth: Payer: Self-pay | Admitting: *Deleted

## 2014-10-27 ENCOUNTER — Encounter: Attending: Registered Nurse

## 2014-10-27 ENCOUNTER — Ambulatory Visit (HOSPITAL_BASED_OUTPATIENT_CLINIC_OR_DEPARTMENT_OTHER): Admitting: Physical Medicine & Rehabilitation

## 2014-10-27 ENCOUNTER — Encounter: Payer: Self-pay | Admitting: Physical Medicine & Rehabilitation

## 2014-10-27 VITALS — BP 168/86 | HR 74 | Resp 14

## 2014-10-27 DIAGNOSIS — G8929 Other chronic pain: Secondary | ICD-10-CM | POA: Diagnosis not present

## 2014-10-27 DIAGNOSIS — M533 Sacrococcygeal disorders, not elsewhere classified: Secondary | ICD-10-CM | POA: Insufficient documentation

## 2014-10-27 DIAGNOSIS — M62838 Other muscle spasm: Secondary | ICD-10-CM | POA: Insufficient documentation

## 2014-10-27 DIAGNOSIS — Z981 Arthrodesis status: Secondary | ICD-10-CM | POA: Diagnosis not present

## 2014-10-27 DIAGNOSIS — G4700 Insomnia, unspecified: Secondary | ICD-10-CM | POA: Diagnosis not present

## 2014-10-27 DIAGNOSIS — G894 Chronic pain syndrome: Secondary | ICD-10-CM

## 2014-10-27 DIAGNOSIS — M961 Postlaminectomy syndrome, not elsewhere classified: Secondary | ICD-10-CM | POA: Diagnosis not present

## 2014-10-27 DIAGNOSIS — Z760 Encounter for issue of repeat prescription: Secondary | ICD-10-CM | POA: Diagnosis not present

## 2014-10-27 MED ORDER — OXYMORPHONE HCL ER 10 MG PO TB12: 10.0000 mg | ORAL_TABLET | Freq: Two times a day (BID) | ORAL | Status: DC

## 2014-10-27 NOTE — Telephone Encounter (Signed)
Patient did not want to do UDS today because [facility] has not paid for them at all and now it is reporting on her credit.  Patient is asking for assistance to get this matter cleared by.

## 2014-10-27 NOTE — Patient Instructions (Signed)
Sacroiliac injection was performed today. A combination of a naming medicine plus a cortisone medicine was injected. The injection was done under x-ray guidance. This procedure has been performed to help reduce low back and buttocks pain as well as potentially hip pain. The duration of this injection is variable lasting from hours to  Months. It may repeated if needed. 

## 2014-10-27 NOTE — Progress Notes (Signed)
Right sacroiliac injection under fluoroscopic guidance  Indication: Right Low back and buttocks pain not relieved by medication management and other conservative care.  Informed consent was obtained after describing risks and benefits of the procedure with the patient, this includes bleeding, bruising, infection, paralysis and medication side effects. The patient wishes to proceed and has given written consent. The patient was placed in a prone position. The lumbar and sacral area was marked and prepped with Betadine. A 25-gauge 1-1/2 inch needle was inserted into the skin and subcutaneous tissue and 1 mL of 1% lidocaine was injected. Then a 25-gauge 3.5 inch spinal needle was inserted under fluoroscopic guidance into the Right sacroiliac joint. AP and lateral images were utilized. Omnipaque 180x0.5 mL under live fluoroscopy demonstrated no intravascular uptake. Then a solution containing one ML of 40 mg per mL depomedrol and 2 ML of 1% lidocaine MPF was injected x1.5 mL. Patient tolerated the procedure well. Post procedure instructions were given. Please see post procedure form. Patient initially had left-sided pain at last visit more right-sided currently.

## 2014-10-27 NOTE — Progress Notes (Signed)
  PROCEDURE RECORD [facility]   Name: [name] DOB:February 04, 1966 MRN: [id number]  Date:10/27/2014  Physician: Claudette LawsAndrew Kirsteins, MD    Nurse/CMA: Carline Dura   Allergies:  Allergies  Allergen Reactions  . Adhesive [Tape]     Steri-strips caused blisters  . Latex Itching and Rash  . Lisinopril     Difficult swallowing  . Relafen [Nabumetone] Swelling  . Codeine Itching  . Robaxin [Methocarbamol] Other (See Comments)    Hospital gave it to her she is unsure of reaction    Consent Signed: Yes.    Is patient diabetic? No.  CBG today? .  Pregnant: No. LMP: No LMP recorded. Patient has had a hysterectomy. (age [age])  Anticoagulants: no Anti-inflammatory: no Antibiotics: no  Procedure: Bilateral Sacroiliac   ESI   Position: Prone Start Time:  3:04 End Time:  3:08 Fluoro Time: 12  RN/CMA [name]  [name]     Time 2:52 3:11    BP 168/86 180/92    Pulse 74 94    Respirations 14 14    O2 Sat 99 100    S/S 6 6    Pain Level 7/10   6 /10     D/C home with friend , patient A & O X 3, D/C instructions reviewed, and sits independently.

## 2014-10-30 NOTE — Telephone Encounter (Signed)
Please ask the patient to send us the bills/statements so we can assist her in this process

## 2014-11-20 ENCOUNTER — Encounter (HOSPITAL_BASED_OUTPATIENT_CLINIC_OR_DEPARTMENT_OTHER): Payer: Self-pay | Admitting: Family Medicine

## 2014-11-20 ENCOUNTER — Emergency Department (HOSPITAL_BASED_OUTPATIENT_CLINIC_OR_DEPARTMENT_OTHER)
Admission: EM | Admit: 2014-11-20 | Discharge: 2014-11-20 | Disposition: A | Discharge status: Home / Self Care | Payer: Federal, State, Local not specified - PPO | Attending: Emergency Medicine | Admitting: Emergency Medicine

## 2014-11-20 ENCOUNTER — Emergency Department (HOSPITAL_BASED_OUTPATIENT_CLINIC_OR_DEPARTMENT_OTHER): Payer: Federal, State, Local not specified - PPO

## 2014-11-20 DIAGNOSIS — Z79899 Other long term (current) drug therapy: Secondary | ICD-10-CM | POA: Diagnosis not present

## 2014-11-20 DIAGNOSIS — R110 Nausea: Secondary | ICD-10-CM | POA: Insufficient documentation

## 2014-11-20 DIAGNOSIS — I10 Essential (primary) hypertension: Secondary | ICD-10-CM | POA: Insufficient documentation

## 2014-11-20 DIAGNOSIS — Z8739 Personal history of other diseases of the musculoskeletal system and connective tissue: Secondary | ICD-10-CM | POA: Diagnosis not present

## 2014-11-20 DIAGNOSIS — G8929 Other chronic pain: Secondary | ICD-10-CM | POA: Insufficient documentation

## 2014-11-20 DIAGNOSIS — E669 Obesity, unspecified: Secondary | ICD-10-CM | POA: Insufficient documentation

## 2014-11-20 DIAGNOSIS — Z91040 Latex allergy status: Secondary | ICD-10-CM | POA: Insufficient documentation

## 2014-11-20 DIAGNOSIS — R079 Chest pain, unspecified: Secondary | ICD-10-CM | POA: Diagnosis present

## 2014-11-20 LAB — CBC
HEMATOCRIT: 32.8 % — AB (ref 36.0–46.0)
HEMOGLOBIN: 10.2 g/dL — AB (ref 12.0–15.0)
MCH: 27.5 pg (ref 26.0–34.0)
MCHC: 31.1 g/dL (ref 30.0–36.0)
MCV: 88.4 fL (ref 78.0–100.0)
Platelets: 212 10*3/uL (ref 150–400)
RBC: 3.71 MIL/uL — ABNORMAL LOW (ref 3.87–5.11)
RDW: 12.9 % (ref 11.5–15.5)
WBC: 5.9 10*3/uL (ref 4.0–10.5)

## 2014-11-20 LAB — BASIC METABOLIC PANEL
Anion gap: 8 (ref 5–15)
BUN: 9 mg/dL (ref 6–20)
CHLORIDE: 104 mmol/L (ref 101–111)
CO2: 26 mmol/L (ref 22–32)
Calcium: 8.4 mg/dL — ABNORMAL LOW (ref 8.9–10.3)
Creatinine, Ser: 0.64 mg/dL (ref 0.44–1.00)
GFR calc Af Amer: >60 mL/min (ref >60)
GFR calc non Af Amer: >60 mL/min (ref >60)
GLUCOSE: 90 mg/dL (ref 65–99)
POTASSIUM: 3.8 mmol/L (ref 3.5–5.1)
Sodium: 138 mmol/L (ref 135–145)

## 2014-11-20 LAB — TROPONIN I
Troponin I: <0.03 ng/mL (ref <0.031)
Troponin I: <0.03 ng/mL (ref <0.031)

## 2014-11-20 MED ORDER — ASPIRIN 81 MG PO CHEW
324.0000 mg | CHEWABLE_TABLET | Freq: Once | ORAL | Status: AC
  Administered 2014-11-20: 324 mg via ORAL
  Filled 2014-11-20: qty 4

## 2014-11-20 MED ORDER — NITROGLYCERIN 0.4 MG SL SUBL
0.4000 mg | SUBLINGUAL_TABLET | SUBLINGUAL | Status: AC | PRN
  Administered 2014-11-20 (×3): 0.4 mg via SUBLINGUAL
  Filled 2014-11-20: qty 1

## 2014-11-20 MED ORDER — NITROGLYCERIN 0.4 MG SL SUBL: 0.4000 mg | SUBLINGUAL_TABLET | SUBLINGUAL | Status: DC | PRN

## 2014-11-20 NOTE — ED Provider Notes (Signed)
CSN: [id number]     Arrival date & time 11/20/14  4098 History   First MD Initiated Contact with Patient 11/20/14 0734     Chief Complaint  Patient presents with  . Chest Pain     (Consider location/radiation/quality/duration/timing/severity/associated sxs/prior Treatment) Patient is a 49 y.o. female presenting with chest pain.  Chest Pain Pain location:  Substernal area Pain quality: sharp   Pain radiates to:  Does not radiate Pain radiates to the back: no   Pain severity:  Moderate Onset quality:  Gradual Duration:  1 hour Timing:  Constant Progression:  Improving Chronicity:  Recurrent Context: at rest   Relieved by:  Nothing Worsened by:  Nothing tried Associated symptoms: nausea   Associated symptoms: not vomiting     Past Medical History  Diagnosis Date  . DDD (degenerative disc disease)     at L4-5 and L5-S1  . Chronic lower back pain   . Cardiomyopathy 06/23/2014  . Essential hypertension 06/23/2014  . Obesity (BMI 30-39.9)    Past Surgical History  Procedure Laterality Date  . Nasal sinus surgery    . Cesarean section  1990  . Abdominal hysterectomy    . Laparoscopic ovarian cystectomy      post cyst rupture  . Anterior lumbar fusion  12/19/2010    L4-5 and L5-S1  . Breast lumpectomy    . Left and right heart catheterization with coronary angiogram N/A 06/27/2014    Procedure: LEFT AND RIGHT HEART CATHETERIZATION WITH CORONARY ANGIOGRAM;  Surgeon: Corky Crafts, MD;  Location: [facility] CATH LAB;  Service: Cardiovascular;  Laterality: N/A;   Family History  Problem Relation Age of Onset  . Hypertension Mother   . Heart disease Father    History  Substance Use Topics  . Smoking status: Never Smoker   . Smokeless tobacco: Never Used  . Alcohol Use: No   OB History    No data available     Review of Systems  Cardiovascular: Positive for chest pain.  Gastrointestinal: Positive for nausea. Negative for vomiting.  All other systems reviewed and are  negative.     Allergies  Adhesive; Latex; Lisinopril; Relafen; Codeine; and Robaxin  Home Medications   Prior to Admission medications   Medication Sig Start Date End Date Taking? Authorizing Provider  [facility] SENNA 8.6 MG tablet TAKE 2 TABLETS (17.2 MG TOTAL) BY MOUTH DAILY AS NEEDED. 07/24/14   Erick Colace, MD  cyclobenzaprine (FLEXERIL) 10 MG tablet Take 1 tablet (10 mg total) by mouth at bedtime. 09/04/14   Jones Bales, NP  FLECTOR 1.3 % PTCH PLACE 1 PATCH ONTO THE SKIN 2 (TWO) TIMES DAILY. 07/24/14   Erick Colace, MD  furosemide (LASIX) 40 MG tablet Take 40 mg by mouth.    Historical Provider, MD  Heat Wraps [facility] BACK/HIP) MISC APPLY 1 PATCH EVERY DAY 09/28/14   Erick Colace, MD  hydrALAZINE (APRESOLINE) 25 MG tablet Take 25 mg by mouth 2 (two) times daily.    Historical Provider, MD  hydrALAZINE (APRESOLINE) 50 MG tablet Take 50 mg by mouth 3 (three) times daily.    Historical Provider, MD  hydrochlorothiazide (HYDRODIURIL) 25 MG tablet Take 25 mg by mouth daily.    Historical Provider, MD  hydrochlorothiazide (HYDRODIURIL) 50 MG tablet Take 25 mg by mouth daily.     Historical Provider, MD  ibuprofen (ADVIL,MOTRIN) 800 MG tablet TAKE 1 TABLET (800 MG TOTAL) BY MOUTH EVERY 8 (EIGHT) HOURS AS NEEDED. 30 DAYS SUPPLY  09/22/13   Erick Colace, MD  lidocaine (LIDODERM) 5 % Place 1 patch onto the skin daily. Remove & Discard patch within 12 hours or as directed by MD 12/26/13   Erick Colace, MD  metoprolol succinate (TOPROL-XL) 25 MG 24 hr tablet Take 25 mg by mouth daily.    Historical Provider, MD  nitroGLYCERIN (NITROSTAT) 0.4 MG SL tablet Place 1 tablet (0.4 mg total) under the tongue every 5 (five) minutes as needed for chest pain. 11/20/14   Mirian Mo, MD  oxymorphone (OPANA ER) 10 MG 12 hr tablet Take 1 tablet (10 mg total) by mouth every 12 (twelve) hours. 11/21/14   Erick Colace, MD  polyethylene glycol powder (GLYCOLAX/MIRALAX) powder TAKE  17 G BY MOUTH 2 (TWO) TIMES DAILY. 07/24/14   Erick Colace, MD  spironolactone (ALDACTONE) 25 MG tablet Take 25 mg by mouth daily.    Historical Provider, MD  tiZANidine (ZANAFLEX) 4 MG capsule Take 1 capsule (4 mg total) by mouth 2 (two) times daily as needed for muscle spasms. 09/04/14   Jones Bales, NP  zolpidem (AMBIEN) 5 MG tablet Take 1 tablet (5 mg total) by mouth at bedtime as needed for sleep. Take one tablet at bedtime when needed 11/21/14   Erick Colace, MD   BP 139/85 mmHg  Pulse 88  Temp(Src) 99 F (37.2 C) (Oral)  Resp 18  Ht 5' 7.5" (1.715 m)  Wt 198 lb (89.812 kg)  BMI 30.54 kg/m2  SpO2 100% Physical Exam  Constitutional: She is oriented to person, place, and time. She appears well-developed and well-nourished.  HENT:  Head: Normocephalic and atraumatic.  Right Ear: External ear normal.  Left Ear: External ear normal.  Eyes: Conjunctivae and EOM are normal. Pupils are equal, round, and reactive to light.  Neck: Normal range of motion. Neck supple.  Cardiovascular: Normal rate, regular rhythm, normal heart sounds and intact distal pulses.   Pulmonary/Chest: Effort normal and breath sounds normal.  Abdominal: Soft. Bowel sounds are normal. There is no tenderness.  Musculoskeletal: Normal range of motion.  Neurological: She is alert and oriented to person, place, and time.  Skin: Skin is warm and dry.  Vitals reviewed.   ED Course  Procedures (including critical care time) Labs Review Labs Reviewed  BASIC METABOLIC PANEL - Abnormal; Notable for the following:    Calcium 8.4 (*)    All other components within normal limits  CBC - Abnormal; Notable for the following:    RBC 3.71 (*)    Hemoglobin 10.2 (*)    HCT 32.8 (*)    All other components within normal limits  TROPONIN I  TROPONIN I    Imaging Review No results found.   EKG Interpretation   Date/Time:  Monday November 20 2014 07:35:15 EDT Ventricular Rate:  85 PR Interval:  148 QRS  Duration: 84 QT Interval:  366 QTC Calculation: 435 R Axis:   60 Text Interpretation:  Normal sinus rhythm Normal ECG ED PHYSICIAN  INTERPRETATION AVAILABLE IN [facility] Confirmed by TEST, Record  ([id number]) on 11/21/2014 7:23:56 AM      MDM   Final diagnoses:  Chest pain, unspecified chest pain type    49 y.o. female with pertinent PMH of nonischemic cardiomyopathy, chronic back pain presents with chest pain as above.  Pt's cardiologist is Dr. Donnie Aho, she had a clean cath 5 months ago.  Physical exam today bengin.  Wu as above, unremarkable.  I spoke with cardiology on  call (tilley not available), who agree with plan to dc home with cardiology fu.  I have reviewed all laboratory and imaging studies if ordered as above  1. Chest pain, unspecified chest pain type         Mirian Mo, MD 11/23/14 1506

## 2014-11-20 NOTE — Discharge Instructions (Signed)

## 2014-11-20 NOTE — ED Notes (Signed)
Patient transported to X-ray 

## 2014-11-20 NOTE — ED Notes (Signed)
Pt c/o stabbing central non radiating chest pain that started approximately 25 mins ago while sitting at her desk. Nausea proceeded the chest pain, denies vomiting, denies dizziness, shortness of breath. Had a "clean" cardiac cath in January per pt.

## 2014-11-21 ENCOUNTER — Ambulatory Visit: Admitting: Physical Medicine & Rehabilitation

## 2014-11-21 ENCOUNTER — Encounter: Attending: Registered Nurse

## 2014-11-21 ENCOUNTER — Encounter: Payer: Self-pay | Admitting: Physical Medicine & Rehabilitation

## 2014-11-21 ENCOUNTER — Ambulatory Visit (HOSPITAL_BASED_OUTPATIENT_CLINIC_OR_DEPARTMENT_OTHER): Payer: Federal, State, Local not specified - PPO | Admitting: Physical Medicine & Rehabilitation

## 2014-11-21 DIAGNOSIS — M62838 Other muscle spasm: Secondary | ICD-10-CM

## 2014-11-21 DIAGNOSIS — Z760 Encounter for issue of repeat prescription: Secondary | ICD-10-CM | POA: Diagnosis not present

## 2014-11-21 DIAGNOSIS — G8929 Other chronic pain: Secondary | ICD-10-CM | POA: Insufficient documentation

## 2014-11-21 DIAGNOSIS — M533 Sacrococcygeal disorders, not elsewhere classified: Secondary | ICD-10-CM | POA: Diagnosis not present

## 2014-11-21 DIAGNOSIS — G4700 Insomnia, unspecified: Secondary | ICD-10-CM | POA: Diagnosis not present

## 2014-11-21 DIAGNOSIS — M961 Postlaminectomy syndrome, not elsewhere classified: Secondary | ICD-10-CM | POA: Diagnosis not present

## 2014-11-21 DIAGNOSIS — Z981 Arthrodesis status: Secondary | ICD-10-CM | POA: Insufficient documentation

## 2014-11-21 MED ORDER — OXYMORPHONE HCL ER 10 MG PO TB12: 10.0000 mg | ORAL_TABLET | Freq: Two times a day (BID) | ORAL | Status: DC

## 2014-11-21 MED ORDER — ZOLPIDEM TARTRATE 5 MG PO TABS: 5.0000 mg | ORAL_TABLET | Freq: Every evening | ORAL | Status: DC | PRN

## 2014-11-21 NOTE — Progress Notes (Signed)
Subjective:    Patient ID: Molly Norton, female    DOB: [birth year], 49 y.o.   MRN: [id number]  HPI  Pain Inventory Average Pain 5 Pain Right Now 4 My pain is intermittent, sharp, burning, dull, stabbing, tingling and aching  In the last 24 hours, has pain interfered with the following? General activity 3 Relation with others 3 Enjoyment of life 3 What TIME of day is your pain at its worst? morning, evening and night Sleep (in general) Poor  Pain is worse with: walking, sitting, inactivity, standing and some activites Pain improves with: rest, heat/ice, therapy/exercise, pacing activities, medication, TENS and injections Relief from Meds: 6  Mobility walk without assistance how many minutes can you walk? 15-20 ability to climb steps?  yes do you drive?  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  no  Physicians involved in your care Any changes since last visit?  no   Family History  Problem Relation Age of Onset  . Hypertension Mother   . Heart disease Father    History   Social History  . Marital Status: Married    Spouse Name: N/A  . Number of Children: N/A  . Years of Education: N/A   Social History Main Topics  . Smoking status: Never Smoker   . Smokeless tobacco: Never Used  . Alcohol Use: No  . Drug Use: No  . Sexual Activity: Not on file   Other Topics Concern  . None   Social History Narrative   Separated, lives alone, marital stress in past.    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 Nasal sinus surgery    . Cesarean section  1990  . Abdominal hysterectomy    . Laparoscopic ovarian cystectomy      post cyst rupture  . Anterior lumbar fusion  12/19/2010    L4-5 and L5-S1  . Breast lumpectomy    . Left and right heart  catheterization with coronary angiogram N/A 06/27/2014    Procedure: LEFT AND RIGHT HEART CATHETERIZATION WITH CORONARY ANGIOGRAM;  Surgeon: Corky Crafts, MD;  Location: [facility] CATH LAB;  Service: Cardiovascular;  Laterality: N/A;   Past Medical History  Diagnosis Date  . DDD (degenerative disc disease)     at L4-5 and L5-S1  . Chronic lower back pain   . Cardiomyopathy 06/23/2014  . Essential hypertension 06/23/2014  . Obesity (BMI 30-39.9)    There were no vitals taken for this visit.  Opioid Risk Score:   Fall Risk Score: Low Fall Risk (0-5 points)`1  Depression screen PHQ 2/9  Depression screen PHQ 2/9 09/04/2014  Decreased Interest 0  Down, Depressed, Hopeless 0  PHQ - 2 Score 0  Altered sleeping 2  Tired, decreased energy 2  Change in appetite 0  Feeling bad or failure about yourself  0  Trouble concentrating 0  Moving slowly or fidgety/restless 0  Suicidal thoughts 0  PHQ-9 Score 4     Review of Systems  HENT: Negative.   Eyes: Negative.   Respiratory: Negative.   Cardiovascular: Negative.   Gastrointestinal: Positive for constipation.  Endocrine: Negative.   Genitourinary: Negative.   Musculoskeletal: Positive for myalgias, back pain and arthralgias.  Skin: Negative.   Allergic/Immunologic: Negative.   Neurological: Positive for weakness and numbness.  Tingling, spasms  Hematological: Negative.   Psychiatric/Behavioral: Negative.        Objective:   Physical Exam        Assessment & Plan:  Botulinum toxin injection under needle EMG guidance Indication muscle spasm in the lumbar paraspinal muscles chronic and severe which does not respond to oral anti-spasticity medications or physical therapy Informed consent was obtained after describing risks and benefits of the procedure with the patient these include bleeding bruising and infection she elects to proceed and has given written consent patient placed in a forward flexed posture. There is  adjacent to L4-L5 and S1 marked and prepped with betadine and alcohol. Entered with a 25-gauge 50 mm needle electrode under needle EMG guidance 25 units of Botox injected into  25U Bilateral S1 paraspinal muscles 25 U Bilateral L5 paraspinal muscles 25 units left L4 Paraspinal muscles 25 units right L4 paraspinal muscles 25 units bilateral gluteus medius Patient tolerated procedure well. All injections done after appropriate EMG activity

## 2014-11-21 NOTE — Patient Instructions (Signed)

## 2014-12-01 ENCOUNTER — Ambulatory Visit: Payer: Self-pay | Admitting: Physical Medicine & Rehabilitation

## 2014-12-02 ENCOUNTER — Other Ambulatory Visit: Payer: Self-pay | Admitting: Physical Medicine & Rehabilitation

## 2014-12-22 ENCOUNTER — Telehealth: Payer: Self-pay | Admitting: Physical Medicine & Rehabilitation

## 2014-12-25 ENCOUNTER — Ambulatory Visit: Payer: Self-pay | Admitting: Physical Medicine & Rehabilitation

## 2014-12-26 ENCOUNTER — Encounter: Payer: Self-pay | Admitting: Physical Medicine & Rehabilitation

## 2014-12-26 ENCOUNTER — Encounter: Attending: Registered Nurse

## 2014-12-26 ENCOUNTER — Ambulatory Visit (HOSPITAL_BASED_OUTPATIENT_CLINIC_OR_DEPARTMENT_OTHER): Admitting: Physical Medicine & Rehabilitation

## 2014-12-26 VITALS — BP 162/100 | HR 78 | Resp 16

## 2014-12-26 DIAGNOSIS — G8929 Other chronic pain: Secondary | ICD-10-CM | POA: Insufficient documentation

## 2014-12-26 DIAGNOSIS — G4700 Insomnia, unspecified: Secondary | ICD-10-CM | POA: Insufficient documentation

## 2014-12-26 DIAGNOSIS — M533 Sacrococcygeal disorders, not elsewhere classified: Secondary | ICD-10-CM | POA: Diagnosis not present

## 2014-12-26 DIAGNOSIS — Z760 Encounter for issue of repeat prescription: Secondary | ICD-10-CM | POA: Insufficient documentation

## 2014-12-26 DIAGNOSIS — Z981 Arthrodesis status: Secondary | ICD-10-CM | POA: Insufficient documentation

## 2014-12-26 DIAGNOSIS — M961 Postlaminectomy syndrome, not elsewhere classified: Secondary | ICD-10-CM | POA: Insufficient documentation

## 2014-12-26 DIAGNOSIS — M545 Low back pain: Secondary | ICD-10-CM | POA: Diagnosis not present

## 2014-12-26 DIAGNOSIS — M62838 Other muscle spasm: Secondary | ICD-10-CM | POA: Diagnosis not present

## 2014-12-26 MED ORDER — OXYMORPHONE HCL ER 10 MG PO TB12: 10.0000 mg | ORAL_TABLET | Freq: Two times a day (BID) | ORAL | Status: DC

## 2014-12-26 MED ORDER — TIZANIDINE HCL 4 MG PO CAPS: 4.0000 mg | ORAL_CAPSULE | Freq: Two times a day (BID) | ORAL | Status: DC | PRN

## 2014-12-26 MED ORDER — DICLOFENAC EPOLAMINE 1.3 % TD PTCH: MEDICATED_PATCH | TRANSDERMAL | Status: DC

## 2014-12-26 MED ORDER — OXYCODONE HCL 10 MG PO TABS: 10.0000 mg | ORAL_TABLET | Freq: Two times a day (BID) | ORAL | Status: DC | PRN

## 2014-12-26 NOTE — Patient Instructions (Signed)
Continue to take Opana ER 10 mg twice a day May take oxycodone 10 mg in between the Opana ER doses as needed No driving today Okay for full activity tomorrow

## 2014-12-26 NOTE — Progress Notes (Signed)
  PROCEDURE RECORD [facility]   Name: Molly Norton DOB:08-27-1965 MRN: [id number]  Date:12/26/2014  Physician: Claudette LawsAndrew Kirsteins, MD    Nurse/CMA: Jourdan Durbin   Allergies:  Allergies  Allergen Reactions  . Adhesive [Tape]     Steri-strips caused blisters  . Latex Itching and Rash  . Lisinopril     Difficult swallowing  . Relafen [Nabumetone] Swelling  . Codeine Itching  . Robaxin [Methocarbamol] Other (See Comments)    Hospital gave it to her she is unsure of reaction    Consent Signed: Yes.    Is patient diabetic? No.  CBG today? .  Pregnant: No. LMP: No LMP recorded. Patient has had a hysterectomy. (age [age])  Anticoagulants: no Anti-inflammatory: no Antibiotics: no  Procedure:  Position: Prone Start Time: 11:33 End Time:  11:40 Fluoro Time: 10  RN/CMA Molly Norton  Molly Norton     Time 11:00 11:41    BP 184/100 182/90    Pulse 78 92    Respirations 14 14    O2 Sat 99 100    S/S 6 6    Pain Level 10/10  9/10     D/C home with friend, patient A & O X 3, D/C instructions reviewed, and sits independently.

## 2014-12-26 NOTE — Progress Notes (Signed)
Subjective:    Patient ID: Molly Norton, female    DOB: 1965/11/09, 49 y.o.   MRN: [id number]  HPI  Botox injection didn't help last month, paraspinal muscles, not glut medius RLE pain and weakness starting around 7/3.  Normally pt can rest for a couple days and  Patient pain today is 10/10 - in tears needed to lay down, could not sit, very unsteady on her feet. Dragging right leg Has been of work since 7/6 Seen by Dr Yevette Edwardsumonski  , Has another appt with him, gave hime FMLA papers,  Pt with numbness and heaviness in right foot, had one fall last week.  Has used [employer] crutch at home Had a driver today   Pain Inventory Average Pain 6 Pain Right Now 10 My pain is constant, sharp, dull and aching  In the last 24 hours, has pain interfered with the following? General activity 10 Relation with others 10 Enjoyment of life 10 What TIME of day is your pain at its worst? morning, daytime and evening Sleep (in general) Fair  Pain is worse with: bending, sitting and standing Pain improves with: rest, medication and injections Relief from Meds: 5  Mobility walk without assistance how many minutes can you walk? 1-2 ability to climb steps?  no do you drive?  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  no  Physicians involved in your care Any changes since last visit?  no   Family History  Problem Relation Age of Onset  . Hypertension Mother   . Heart disease Father    History   Social History  . Marital Status: Married    Spouse Name: N/A  . Number of Children: N/A  . Years of Education: N/A   Social History Main Topics  . Smoking status: Never Smoker   . Smokeless tobacco: Never Used  . Alcohol Use: No  . Drug Use: No  . Sexual Activity: Not on file   Other Topics Concern  . None   Social History Narrative   Separated, lives  alone, marital stress in past.    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 Nasal sinus surgery    . Cesarean section  1990  . Abdominal hysterectomy    . Laparoscopic ovarian cystectomy      post cyst rupture  . Anterior lumbar fusion  12/19/2010    L4-5 and L5-S1  . Breast lumpectomy    . Left and right heart catheterization with coronary angiogram N/A 06/27/2014    Procedure: LEFT AND RIGHT HEART CATHETERIZATION WITH CORONARY ANGIOGRAM;  Surgeon: Corky CraftsJayadeep S Varanasi, MD;  [facility];  Service: Cardiovascular;  Laterality: N/A;   Past Medical History  Diagnosis Date  . DDD (degenerative disc disease)     at L4-5 and L5-S1  . Chronic lower back pain   . Cardiomyopathy 06/23/2014  . Essential hypertension 06/23/2014  . Obesity (BMI 30-39.9)    BP 162/100 mmHg  Pulse 78  Resp 16  SpO2 99%  Opioid Risk Score:   Fall Risk Score:  `1  Depression screen PHQ 2/9  [facility] 12/26/2014 09/04/2014  Decreased Interest 3 0  Down, Depressed, Hopeless 3 0  PHQ - 2 Score 6 0  Altered sleeping 2 2  Tired, decreased energy 1 2  Change in appetite 0 0  Feeling bad or failure about yourself  0 0  Trouble concentrating 0 0  Moving slowly or fidgety/restless 0 0  Suicidal thoughts 0 0  PHQ-9 Score 9 4  Difficult doing work/chores Not difficult at all -     Review of Systems  HENT: Negative.   Eyes: Negative.   Respiratory: Negative.   Cardiovascular: Negative.   Gastrointestinal: Positive for constipation.  Endocrine: Negative.   Genitourinary: Negative.   Musculoskeletal: Positive for myalgias, back pain and arthralgias.       Sciatic pain - radiating down into leg and foot  Skin: Negative.   Allergic/Immunologic: Negative.   Neurological: Positive for weakness and numbness.       Trouble walking, spasms  Hematological: Negative.   Psychiatric/Behavioral: Positive for dysphoric mood.  The patient is nervous/anxious.        Objective:   Physical Exam  Constitutional: She is oriented to person, place, and time. She appears well-developed and well-nourished.  HENT:  Head: Normocephalic and atraumatic.  Eyes: Conjunctivae and EOM are normal. Pupils are equal, round, and reactive to light.  Neck: Normal range of motion.  Musculoskeletal:       Lumbar back: She exhibits decreased range of motion and tenderness.  Tenderness over the right piriformis area as well as the area inferior to the PSIS  Neurological: She is alert and oriented to person, place, and time.  Psychiatric: Her speech is normal and behavior is normal. Judgment and thought content normal. Her mood appears anxious. Her affect is labile. Cognition and memory are normal.  Nursing note and vitals reviewed.  Decreased sensation in right L2 and right L3 dermatomal distribution to pinprick Equal sensation in L2 L3 L4 L5-S1 to light touch Motor strength is 4/5 in the right hip flexor and knee extensor and ankle dorsiflexor 5/5 in the left side the same muscle groups Deep tendon reflexes are 2+ bilateral knees and ankles. Negative straight leg tasting      Assessment & Plan:  1. Lumbar postlaminectomy syndrome Status post L4-5 L5-S1 anterior And posterior fusions with chronic pain she's had anterior and posterior fusion's. Has had some relief with chronic Opana will continue current dosage. Monthly opioid monitoring..Consideration for lumbar radiculopathy however no neurologic signs to substantiate this. This is a moderately complex problem with several potential pain generators No bowel or bladder abnormalities that are new to indicate a new cauda equina syndrome History of chronic neurogenic bladder Patient will follow-up with her orthopedic spine surgeon   2.  severe lumbar muscle spasms. These appear to be related to number one. Has had only partial relief with has an iodine and baclofen in combination.  Has had good relief with botulinum toxin injections 200 units into the lumbar paraspinals at L4-L5 and S1. Last injection one year ago. Botox only lasts about 3 months and really needs to 4 times per year injections.  3. Right sacroiliac pain.This appears to be the major pain generator today Has had prior relief with sacroiliac injections, Last injection performed over one year ago.Recommend repeat today Consider sacroiliac RF if only short-term relief

## 2014-12-28 ENCOUNTER — Ambulatory Visit: Payer: Self-pay | Admitting: Registered Nurse

## 2015-01-01 NOTE — Telephone Encounter (Signed)
Called patient to see how she was doing and to let her know if her pain is still really bad that we can do a second injection on 01/23/15.  I asked for her to call us back to let us know how she is doing.

## 2015-01-02 NOTE — Telephone Encounter (Signed)
Please ask her to have Dr Jeanette Caprice send a cc of report to me

## 2015-01-02 NOTE — Telephone Encounter (Signed)
Molly Norton called back.  She is doind some better but still has not been able to return to work.  She is using ice and heat and the break through medication. She does want the appt for the injection.  She does have an appt with Dr Yevette Edwards for the "burning of the nerve" on 01/05/15. The appt for the SI is 01/23/15

## 2015-01-03 NOTE — Telephone Encounter (Signed)
Left message to return to call office to notify Rakisha that we keep toradol in stock and Dr Wynn Banker would like for Dr Yevette Edwards to send him his notes on her visit with him.

## 2015-01-03 NOTE — Telephone Encounter (Signed)
Spoke with patient again today - her pain is 10/10 and she can barely walk she stated "it hasn't been this bad in a long time."  Scheduled her to come in to see Riley Lam and get a toradol injection tomorrow because that seems to help her a lot.  I also explained that Dr. Wynn Banker would like the operative report from Dr. Yevette Edwards who is doing an RF (?) Friday 01/05/15.  She is going to call us back to let us know because she cannot currently drive and has to find someone to drive her.

## 2015-01-04 ENCOUNTER — Encounter: Payer: Self-pay | Admitting: Physical Medicine & Rehabilitation

## 2015-01-04 ENCOUNTER — Encounter: Attending: Registered Nurse | Admitting: Registered Nurse

## 2015-01-04 ENCOUNTER — Encounter: Payer: Self-pay | Admitting: Registered Nurse

## 2015-01-04 VITALS — BP 128/68 | HR 89 | Resp 14

## 2015-01-04 DIAGNOSIS — M62838 Other muscle spasm: Secondary | ICD-10-CM | POA: Diagnosis not present

## 2015-01-04 DIAGNOSIS — M5136 Other intervertebral disc degeneration, lumbar region: Secondary | ICD-10-CM | POA: Diagnosis not present

## 2015-01-04 DIAGNOSIS — I10 Essential (primary) hypertension: Secondary | ICD-10-CM | POA: Insufficient documentation

## 2015-01-04 DIAGNOSIS — M533 Sacrococcygeal disorders, not elsewhere classified: Secondary | ICD-10-CM | POA: Diagnosis not present

## 2015-01-04 DIAGNOSIS — E669 Obesity, unspecified: Secondary | ICD-10-CM | POA: Diagnosis not present

## 2015-01-04 DIAGNOSIS — Z5181 Encounter for therapeutic drug level monitoring: Secondary | ICD-10-CM

## 2015-01-04 DIAGNOSIS — M961 Postlaminectomy syndrome, not elsewhere classified: Secondary | ICD-10-CM

## 2015-01-04 DIAGNOSIS — G894 Chronic pain syndrome: Secondary | ICD-10-CM | POA: Diagnosis not present

## 2015-01-04 DIAGNOSIS — M545 Low back pain, unspecified: Secondary | ICD-10-CM

## 2015-01-04 DIAGNOSIS — Z79899 Other long term (current) drug therapy: Secondary | ICD-10-CM

## 2015-01-04 DIAGNOSIS — G4700 Insomnia, unspecified: Secondary | ICD-10-CM | POA: Insufficient documentation

## 2015-01-04 DIAGNOSIS — G8929 Other chronic pain: Secondary | ICD-10-CM | POA: Insufficient documentation

## 2015-01-04 MED ORDER — KETOROLAC TROMETHAMINE 60 MG/2ML IM SOLN
60.0000 mg | Freq: Once | INTRAMUSCULAR | Status: AC
  Administered 2015-01-04: 60 mg via INTRAMUSCULAR

## 2015-01-04 NOTE — Progress Notes (Signed)
Subjective:    Patient ID: Molly Norton, female    DOB: 01-17-1966, 49 y.o.   MRN: [id number]  HPI: [name] is a 49 year old female who returns for Toradol Injection for acte on chronic pain. She states her pain is located in her lower back and having muscle spasms in her right lower extremity. She rates her pain 10. S/P Botox no relief noted, she states usually the second botox injection helps. Toradol given today.Her current exercise regime is attending physical therapy twice a week. She has a consultation appointment with Dr. Yevette Edwards in the morning.  Also states she was walking in her kitchen and her right knee buckled she landed on her right knee. She was able to brace herself with the kitchen countertops. Didn't seek medical attention.  Pain Inventory Average Pain 5 Pain Right Now 10 My pain is constant, sharp, stabbing and aching  In the last 24 hours, has pain interfered with the following? General activity 10 Relation with others 10 Enjoyment of life 10 What TIME of day is your pain at its worst? ALL Sleep (in general) Poor  Pain is worse with: walking, sitting, standing and some activites Pain improves with: therapy/exercise, medication and injections Relief from Meds: 5  Mobility walk without assistance how many minutes can you walk? 2-3 ability to climb steps?  no do you drive?  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  no  Physicians involved in your care Any changes since last visit?  no   Family History  Problem Relation Age of Onset  . Hypertension Mother   . Heart disease Father    History   Social History  . Marital Status: Married    Spouse Name: N/A  . Number of Children: N/A  . Years of Education: N/A   Social History Main Topics  . Smoking status: Never Smoker   . Smokeless  tobacco: Never Used  . Alcohol Use: No  . Drug Use: No  . Sexual Activity: Not on file   Other Topics Concern  . None   Social History Narrative   Separated, lives alone, marital stress in past.    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 Nasal sinus surgery    . Cesarean section  1990  . Abdominal hysterectomy    . Laparoscopic ovarian cystectomy      post cyst rupture  . Anterior lumbar fusion  12/19/2010    L4-5 and L5-S1  . Breast lumpectomy    . Left and right heart catheterization with coronary angiogram N/A 06/27/2014    Procedure: LEFT AND RIGHT HEART CATHETERIZATION WITH CORONARY ANGIOGRAM;  Surgeon: Corky Crafts, MD;  Location: [facility] CATH LAB;  Service: Cardiovascular;  Laterality: N/A;   Past Medical History  Diagnosis Date  . DDD (degenerative disc disease)     at L4-5 and L5-S1  . Chronic lower back pain   . Cardiomyopathy 06/23/2014  . Essential hypertension 06/23/2014  . Obesity (BMI 30-39.9)    BP 128/68 mmHg  Pulse 89  Resp 14  SpO2 98%  Opioid Risk Score:   Fall Risk Score:  `1  Depression screen PHQ 2/9  Depression screen [facility] 2/9 01/04/2015 12/26/2014 09/04/2014  Decreased Interest 3 3 0  Down, Depressed, Hopeless 3  3 0  PHQ - 2 Score 6 6 0  Altered sleeping Tired, decreased energy Change in appetite 0 0 0  Feeling bad or failure about yourself  0 0 0  Trouble concentrating 0 0 0  Moving slowly or fidgety/restless 0 0 0  Suicidal thoughts 0 0 0  PHQ-9 Score Difficult doing work/chores - Not difficult at all -     Review of Systems  HENT: Negative.   Eyes: Negative.   Respiratory: Negative.   Cardiovascular: Negative.   Gastrointestinal: Negative.   Endocrine: Negative.   Genitourinary: Negative.   Musculoskeletal: Positive for myalgias, back pain and arthralgias.  Skin: Negative.   Allergic/Immunologic: Negative.   Neurological: Positive for  weakness.       Trouble walking, spasms, increased sciatic pain  Hematological: Negative.   Psychiatric/Behavioral: Positive for dysphoric mood. The patient is nervous/anxious.        Objective:   Physical Exam  Constitutional: She is oriented to person, place, and time. She appears well-developed and well-nourished.  HENT:  Head: Normocephalic and atraumatic.  Neck: Normal range of motion. Neck supple.  Cardiovascular: Normal rate and regular rhythm.   Pulmonary/Chest: Effort normal and breath sounds normal.  Musculoskeletal:  Normal Muscle Bulk and Muscle Testing Reveals: Upper Extremities: Full ROM and Muscle Strength 5/5 Lumbar Paraspinal Tenderness: L-3- L-5 Lower Extremities: Full ROM and Muscle Strength 5/5 Right Lower Extremity with Spasm Noted Antalgic Gait  Neurological: She is alert and oriented to person, place, and time.  Skin: Skin is warm and dry.  Psychiatric: She has a normal mood and affect.  Nursing note and vitals reviewed.         Assessment & Plan:  1. Acute Exacerbation on Chronic Low Back Pain: RX Toradol 2. Lumbar post lami syndrome, s/p 360 degree fusion L4-5,5-S1 with chronic pain: Continue Oxymorphone ER  BID #60 and Oxycodone 10 mg one tablet BID as needed. 2. Right sacroiliac disorder: S/Plumbar botox: No relief noted 3. Insomnia: Continued: Ambien 5 mg one tablet at HS prn  4. Muscle Spasm: Continue: Flexeril 10 mg HS and Continue Zanaflex.   20 minutes of face to face patient care time was spent during this visit. All questions were encouraged and answered.  F/U in 1 month

## 2015-01-13 ENCOUNTER — Other Ambulatory Visit: Payer: Self-pay | Admitting: Physical Medicine & Rehabilitation

## 2015-01-15 ENCOUNTER — Other Ambulatory Visit: Payer: Self-pay | Admitting: Physical Medicine & Rehabilitation

## 2015-01-15 MED ORDER — ZOLPIDEM TARTRATE 5 MG PO TABS: 5.0000 mg | ORAL_TABLET | Freq: Every evening | ORAL | Status: DC | PRN

## 2015-01-23 ENCOUNTER — Ambulatory Visit (HOSPITAL_BASED_OUTPATIENT_CLINIC_OR_DEPARTMENT_OTHER): Admitting: Physical Medicine & Rehabilitation

## 2015-01-23 ENCOUNTER — Encounter: Attending: Registered Nurse

## 2015-01-23 ENCOUNTER — Encounter: Payer: Self-pay | Admitting: Physical Medicine & Rehabilitation

## 2015-01-23 VITALS — BP 165/119 | HR 94 | Resp 16

## 2015-01-23 DIAGNOSIS — Z981 Arthrodesis status: Secondary | ICD-10-CM | POA: Insufficient documentation

## 2015-01-23 DIAGNOSIS — G4700 Insomnia, unspecified: Secondary | ICD-10-CM | POA: Diagnosis not present

## 2015-01-23 DIAGNOSIS — Z760 Encounter for issue of repeat prescription: Secondary | ICD-10-CM | POA: Diagnosis not present

## 2015-01-23 DIAGNOSIS — G8929 Other chronic pain: Secondary | ICD-10-CM | POA: Insufficient documentation

## 2015-01-23 DIAGNOSIS — G894 Chronic pain syndrome: Secondary | ICD-10-CM

## 2015-01-23 DIAGNOSIS — M62838 Other muscle spasm: Secondary | ICD-10-CM | POA: Insufficient documentation

## 2015-01-23 DIAGNOSIS — Z79899 Other long term (current) drug therapy: Secondary | ICD-10-CM | POA: Diagnosis not present

## 2015-01-23 DIAGNOSIS — M533 Sacrococcygeal disorders, not elsewhere classified: Secondary | ICD-10-CM | POA: Diagnosis not present

## 2015-01-23 DIAGNOSIS — M961 Postlaminectomy syndrome, not elsewhere classified: Secondary | ICD-10-CM | POA: Diagnosis not present

## 2015-01-23 DIAGNOSIS — Z5181 Encounter for therapeutic drug level monitoring: Secondary | ICD-10-CM

## 2015-01-23 MED ORDER — OXYMORPHONE HCL ER 20 MG PO TB12: 20.0000 mg | ORAL_TABLET | Freq: Two times a day (BID) | ORAL | Status: DC

## 2015-01-23 MED ORDER — GABAPENTIN 100 MG PO CAPS: 100.0000 mg | ORAL_CAPSULE | Freq: Three times a day (TID) | ORAL | Status: DC

## 2015-01-23 NOTE — Patient Instructions (Signed)
Will need copy of latest MRI

## 2015-01-23 NOTE — Progress Notes (Signed)
Subjective:    Patient ID: Molly Norton, female    DOB: November 12, 1965, 49 y.o.   MRN: [id number]  HPI CC:  Right Low back pain radiating to the right great toe Had MRI of lumbar spine ordered by Dr Yevette Edwards from Orthopedics, Has not followed up with him on the results of this yet. Per her reports she is discussed sacroiliac radiofrequency with her surgeon but decided she does not want to pursue this given that it is likely to be a shorter term.  Patient has interest in pursuing sacroiliac joint fusion.  Current meds  Opana ER 10mg  Q12h Oxycodone 10mg  BID  Patient states that she has not worked for several weeks. Her orthopedic surgeon has her out of work per her report. We discussed her medication management and that in the past she had been on Opana ER 20 mg every 12 hours. Because of improved pain she was reduced to 10 mg and this was several months ago and has been relatively stable up until the last6 weeks. Her pain is changed somewhat and has also included the pain shooting down the right leg, an electrical shock feeling  Pain Inventory Average Pain 6 Pain Right Now 9 My pain is constant, sharp, burning, stabbing, tingling and aching  In the last 24 hours, has pain interfered with the following? General activity 9 Relation with others 9 Enjoyment of life 9 What TIME of day is your pain at its worst? morning,day, and evening time. Sleep (in general) Poor  Pain is worse with: walking, bending, sitting, inactivity and standing Pain improves with: rest, heat/ice, pacing activities, medication and injections Relief from Meds: na  Mobility walk with assistance use a cane use a walker how many minutes can you walk? 5-10 min ability to climb steps?  no do you drive?  no use a wheelchair needs help with transfers  Function employed # of hrs/week out of work due to pain. Do you have any goals in this area?  yes  Neuro/Psych bladder control  problems weakness numbness tingling trouble walking spasms  Prior Studies Any changes since last visit?  yes CT/MRI  Physicians involved in your care Any changes since last visit?  no   Family History  Problem Relation Age of Onset  . Hypertension Mother   . Heart disease Father    Social History   Social History  . Marital Status: Married    Spouse Name: N/A  . Number of Children: N/A  . Years of Education: N/A   Social History Main Topics  . Smoking status: Never Smoker   . Smokeless tobacco: Never Used  . Alcohol Use: No  . Drug Use: No  . Sexual Activity: Not Asked   Other Topics Concern  . None   Social History Narrative   Separated, lives alone, marital stress in past.    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 Nasal sinus surgery    . Cesarean section  1990  . Abdominal hysterectomy    . Laparoscopic ovarian cystectomy      post cyst rupture  . Anterior lumbar fusion  12/19/2010    L4-5 and L5-S1  . Breast lumpectomy    . Left and right heart catheterization with coronary angiogram N/A 06/27/2014    Procedure: LEFT AND RIGHT HEART CATHETERIZATION WITH CORONARY ANGIOGRAM;  Surgeon: Corky Crafts, MD;  Location: [facility] CATH LAB;  Service: Cardiovascular;  Laterality: N/A;  Past Medical History  Diagnosis Date  . DDD (degenerative disc disease)     at L4-5 and L5-S1  . Chronic lower back pain   . Cardiomyopathy 06/23/2014  . Essential hypertension 06/23/2014  . Obesity (BMI 30-39.9)    BP 165/119 mmHg  Pulse 94  Resp 16  SpO2 98%  Opioid Risk Score:   Fall Risk Score:  `1  Depression screen PHQ 2/9  Depression screen [facility] 2/9 01/23/2015 01/04/2015 12/26/2014 09/04/2014  Decreased Interest 0  Down, Depressed, Hopeless 0  PHQ - 2 Score 0  Altered sleeping - Tired, decreased energy - Change in appetite - 0 0 0  Feeling bad or failure about  yourself  - 0 0 0  Trouble concentrating - 0 0 0  Moving slowly or fidgety/restless - 0 0 0  Suicidal thoughts - 0 0 0  PHQ-9 Score - Difficult doing work/chores - - Not difficult at all -      Review of Systems  Gastrointestinal: Positive for constipation.  Genitourinary: Positive for difficulty urinating.  Musculoskeletal: Positive for gait problem.  Neurological: Positive for weakness.       Spasms,tingling.   All other systems reviewed and are negative.      Objective:   Physical Exam  Constitutional: She is oriented to person, place, and time. She appears well-developed and well-nourished.  HENT:  Head: Normocephalic and atraumatic.  Eyes: Conjunctivae and EOM are normal. Pupils are equal, round, and reactive to light.  Neurological: She is alert and oriented to person, place, and time.  Nursing note and vitals reviewed.  Her motor strength is 4/5 with give way weakness in the right hip flexor and extensor ankle dorsiflexor great toe extensor. Left lower extremity is 5/5 in hip flexor and extensor, Ankle dorsiflexor and plantar flexor Upper summary motor strength is 5/5 in deltoid, biceps, triceps, grip She has tenderness palpation bilateral PSIS area more on the right side than the left side. She does have some milder pain to palpation in lumbar paraspinals around the operative site but no significant pain above the upper sit site. She has no tenderness palpation of the greater trochanters of the hip. Her straight leg raising test is negative She has no significant pain with lower extremity range of motion.       Assessment & Plan:  1. Lumbar postlaminectomy syndrome she has multifaceted pain including Myofascial pain which has been relieved by Botox in the past however the last injection was not helpful Sacroiliac pain which has responded temporarily to sacroiliac injections and she is looking into sacroiliac fusion Right lower extremity pain appears to be  radicular. We discussed that this may require different treatment approach including gabapentin 100 g 3 times a day which will be prescribed today. We'll also schedule for epidural injection. I will need to review her MRI results.  Patient did not bring in her pill bottles for her oxycodone or Opana. The Oxycodone was new last visit because of her pain exacerbation. She had been getting along on just Opana ER 10 mg twice a day. We'll check urine drug screen.This is to monitor for compliance that she is been complaining of increasing pain and asking for more pain medications as well as not bringing in her pills for pill count. The patient spoke to the RN at the office stating that she does not want come back  to this clinic because she did not get the Botox injections today. Patient cannot have a Botox injection since it is too early. It's only 2 months since last injection. She must wait a minimum of 3 months. If patient wishes to continue receiving care she will need to be seen on regular basis. If not her orthopedist may refer her to another pain specialist

## 2015-01-24 ENCOUNTER — Other Ambulatory Visit: Payer: Self-pay | Admitting: Physical Medicine & Rehabilitation

## 2015-01-25 ENCOUNTER — Telehealth: Payer: Self-pay | Admitting: Physical Medicine & Rehabilitation

## 2015-01-25 LAB — PMP ALCOHOL METABOLITE (ETG): ETGU: NEGATIVE ng/mL

## 2015-01-25 NOTE — Telephone Encounter (Signed)
Patient called very upset about yesterday's appointment. She is unhappy she did not get her Botox injection. She does not understand why it is too soon. She is confused on how to take her new prescription of gabapentin. She wants someone to call her back.   I spoke to Dr. Wynn Banker and relayed her message. He clarified for me. He chose to cancel the injection yesterday himself as he states he will not do another Botox injection until next month. He also clarified that he told her to take her first couple of doses of gabapentin at bedtime because it may make her drowsy and then she is to start taking the prescription as indicated- 3 times per day.  I called Mrs. Forge back [phone]) and left a message for her to call the office. I did not leave detailed information on the voicemail as it did not identify the owner of the voicemail.

## 2015-01-28 LAB — ZOLPIDEM (LC/MS-MS), URINE
ZOLPIDEM METABOLITE (GC/LS/MS) UR, CONFIRM: 515 ng/mL (ref <5)
Zolpidem (GC/LC/MS), Ur confirm: NEGATIVE ng/mL — AB (ref <5)

## 2015-01-28 LAB — OXYCODONE, URINE (LC/MS-MS)
Noroxycodone, Ur: 111 ng/mL — AB (ref <50)
OXYMORPHONE, URINE: 15236 ng/mL (ref <50)
Oxycodone, ur: NEGATIVE ng/mL (ref <50)

## 2015-01-28 LAB — OPIATES/OPIOIDS (LC/MS-MS)
Codeine Urine: NEGATIVE ng/mL (ref <50)
HYDROCODONE: NEGATIVE ng/mL (ref <50)
HYDROMORPHONE: NEGATIVE ng/mL (ref <50)
MORPHINE: NEGATIVE ng/mL (ref <50)
Norhydrocodone, Ur: NEGATIVE ng/mL (ref <50)
Noroxycodone, Ur: 111 ng/mL — AB (ref <50)
Oxycodone, ur: NEGATIVE ng/mL (ref <50)
Oxymorphone: 15236 ng/mL (ref <50)

## 2015-01-29 LAB — PRESCRIPTION MONITORING PROFILE (SOLSTAS)
Amphetamine/Meth: NEGATIVE ng/mL
Barbiturate Screen, Urine: NEGATIVE ng/mL
Benzodiazepine Screen, Urine: NEGATIVE ng/mL
Buprenorphine, Urine: NEGATIVE ng/mL
COCAINE METABOLITES: NEGATIVE ng/mL
Cannabinoid Scrn, Ur: NEGATIVE ng/mL
Carisoprodol, Urine: NEGATIVE ng/mL
Creatinine, Urine: 238.66 mg/dL (ref >20.0)
ECSTASY: NEGATIVE ng/mL
Fentanyl, Ur: NEGATIVE ng/mL
METHADONE SCREEN, URINE: NEGATIVE ng/mL
Meperidine, Ur: NEGATIVE ng/mL
Nitrites, Initial: NEGATIVE ug/mL
Propoxyphene: NEGATIVE ng/mL
Tapentadol, urine: NEGATIVE ng/mL
Tramadol Scrn, Ur: NEGATIVE ng/mL
pH, Initial: 7.6 pH (ref 4.5–8.9)

## 2015-02-09 ENCOUNTER — Telehealth: Payer: Self-pay | Admitting: Physical Medicine & Rehabilitation

## 2015-02-09 NOTE — Telephone Encounter (Signed)
Attempted to reach Molly Norton after she called and is complaining about not getting a call back from last call.  There WAS NO ANSWER and no name on voicemail so no message left.

## 2015-02-09 NOTE — Telephone Encounter (Signed)
Attempted to reach her but no answer and no name on voicemail

## 2015-02-14 NOTE — Progress Notes (Signed)
Urine drug screen for this encounter is consistent for prescribed medication 

## 2015-02-16 ENCOUNTER — Other Ambulatory Visit: Payer: Self-pay | Admitting: Registered Nurse

## 2015-03-01 DIAGNOSIS — G894 Chronic pain syndrome: Secondary | ICD-10-CM

## 2015-04-09 ENCOUNTER — Other Ambulatory Visit: Payer: Self-pay | Admitting: Registered Nurse

## 2015-04-09 ENCOUNTER — Other Ambulatory Visit: Payer: Self-pay | Admitting: Physical Medicine & Rehabilitation

## 2015-04-12 ENCOUNTER — Other Ambulatory Visit: Payer: Self-pay | Admitting: Physical Medicine & Rehabilitation

## 2015-04-12 ENCOUNTER — Other Ambulatory Visit: Payer: Self-pay | Admitting: Registered Nurse

## 2015-05-08 ENCOUNTER — Other Ambulatory Visit: Payer: Self-pay | Admitting: Orthopedic Surgery

## 2015-05-09 ENCOUNTER — Other Ambulatory Visit: Payer: Self-pay | Admitting: Physical Medicine & Rehabilitation

## 2015-05-10 ENCOUNTER — Encounter (HOSPITAL_COMMUNITY)
Admission: RE | Admit: 2015-05-10 | Discharge: 2015-05-10 | Disposition: A | Discharge status: Home / Self Care | Source: Ambulatory Visit | Attending: Orthopedic Surgery | Admitting: Orthopedic Surgery

## 2015-05-10 ENCOUNTER — Encounter (HOSPITAL_COMMUNITY): Payer: Self-pay

## 2015-05-10 DIAGNOSIS — Z79899 Other long term (current) drug therapy: Secondary | ICD-10-CM | POA: Insufficient documentation

## 2015-05-10 DIAGNOSIS — M533 Sacrococcygeal disorders, not elsewhere classified: Secondary | ICD-10-CM | POA: Insufficient documentation

## 2015-05-10 DIAGNOSIS — I509 Heart failure, unspecified: Secondary | ICD-10-CM | POA: Insufficient documentation

## 2015-05-10 DIAGNOSIS — I110 Hypertensive heart disease with heart failure: Secondary | ICD-10-CM | POA: Insufficient documentation

## 2015-05-10 DIAGNOSIS — Z01812 Encounter for preprocedural laboratory examination: Secondary | ICD-10-CM | POA: Diagnosis not present

## 2015-05-10 DIAGNOSIS — Z01818 Encounter for other preprocedural examination: Secondary | ICD-10-CM | POA: Insufficient documentation

## 2015-05-10 DIAGNOSIS — I429 Cardiomyopathy, unspecified: Secondary | ICD-10-CM | POA: Diagnosis not present

## 2015-05-10 HISTORY — DX: Heart failure, unspecified: I50.9

## 2015-05-10 HISTORY — DX: Anemia, unspecified: D64.9

## 2015-05-10 HISTORY — DX: Other specified postprocedural states: R11.2

## 2015-05-10 HISTORY — DX: Constipation, unspecified: K59.00

## 2015-05-10 HISTORY — DX: Other specified postprocedural states: Z98.890

## 2015-05-10 HISTORY — DX: Family history of other specified conditions: Z84.89

## 2015-05-10 LAB — CBC WITH DIFFERENTIAL/PLATELET
BASOS ABS: 0 10*3/uL (ref 0.0–0.1)
BASOS PCT: 0 %
EOS ABS: 0 10*3/uL (ref 0.0–0.7)
EOS PCT: 1 %
HCT: 39.3 % (ref 36.0–46.0)
Hemoglobin: 12.4 g/dL (ref 12.0–15.0)
LYMPHS PCT: 37 %
Lymphs Abs: 2 10*3/uL (ref 0.7–4.0)
MCH: 27.4 pg (ref 26.0–34.0)
MCHC: 31.6 g/dL (ref 30.0–36.0)
MCV: 86.9 fL (ref 78.0–100.0)
MONO ABS: 0.4 10*3/uL (ref 0.1–1.0)
Monocytes Relative: 7 %
Neutro Abs: 3.1 10*3/uL (ref 1.7–7.7)
Neutrophils Relative %: 55 %
PLATELETS: 222 10*3/uL (ref 150–400)
RBC: 4.52 MIL/uL (ref 3.87–5.11)
RDW: 13.2 % (ref 11.5–15.5)
WBC: 5.5 10*3/uL (ref 4.0–10.5)

## 2015-05-10 LAB — URINALYSIS, ROUTINE W REFLEX MICROSCOPIC
Bilirubin Urine: NEGATIVE
GLUCOSE, UA: NEGATIVE mg/dL
Hgb urine dipstick: NEGATIVE
KETONES UR: NEGATIVE mg/dL
LEUKOCYTES UA: NEGATIVE
NITRITE: NEGATIVE
PH: 6.5 (ref 5.0–8.0)
PROTEIN: NEGATIVE mg/dL
Specific Gravity, Urine: 1.01 (ref 1.005–1.030)

## 2015-05-10 LAB — COMPREHENSIVE METABOLIC PANEL
ALBUMIN: 3.9 g/dL (ref 3.5–5.0)
ALT: 15 U/L (ref 14–54)
AST: 20 U/L (ref 15–41)
Alkaline Phosphatase: 49 U/L (ref 38–126)
Anion gap: 7 (ref 5–15)
BUN: 6 mg/dL (ref 6–20)
CHLORIDE: 105 mmol/L (ref 101–111)
CO2: 26 mmol/L (ref 22–32)
CREATININE: 0.61 mg/dL (ref 0.44–1.00)
Calcium: 9 mg/dL (ref 8.9–10.3)
GFR calc Af Amer: >60 mL/min (ref >60)
GFR calc non Af Amer: >60 mL/min (ref >60)
Glucose, Bld: 90 mg/dL (ref 65–99)
POTASSIUM: 4 mmol/L (ref 3.5–5.1)
SODIUM: 138 mmol/L (ref 135–145)
Total Bilirubin: 0.3 mg/dL (ref 0.3–1.2)
Total Protein: 6.9 g/dL (ref 6.5–8.1)

## 2015-05-10 LAB — NO BLOOD PRODUCTS

## 2015-05-10 LAB — SURGICAL PCR SCREEN
MRSA, PCR: NEGATIVE
Staphylococcus aureus: NEGATIVE

## 2015-05-10 LAB — APTT: APTT: 29 s (ref 24–37)

## 2015-05-10 LAB — PROTIME-INR
INR: 1.01 (ref 0.00–1.49)
PROTHROMBIN TIME: 13.5 s (ref 11.6–15.2)

## 2015-05-10 NOTE — Progress Notes (Signed)
Pt denies SOB and chest pain but is under the care of Dr. Donnie Ahoilley, cardiology. Pt denies having a stress test. Pt chart forwarded to Shiloh[place]ison, [place], anesthesia, for review of cardiac history.

## 2015-05-11 NOTE — Progress Notes (Signed)
Anesthesia Chart Review:  Pt is 49 year old female scheduled for R sacroiliac joint fusion on 05/17/2015 with Dr. Yevette Edwardsumonski.   Cardiologist is Dr. Viann FishSpencer Tilley, last office visit 12/25/14.   PMH includes:  CHF, cardiomyopathy, HTN, anemia, post-op N/V.  Never smoker. BMI 31.   Medications include: lasix, hydralazine, hctz, metoprolol, spironolactone  Preoperative labs reviewed.    Chest x-ray 11/20/14 reviewed. No active cardiopulmonary disease.   EKG 11/20/14: NSR.   Echo 12/25/14:  1. LV cavity normal in size. Mild concentric hypertrophy of LV. Normal global wall motion. EF 55% 2. Small atrial septal aneurysm.  3. Trace MR, TR and PR 4. Since prior echo, normalization of LV function.   Cardiac cath 06/24/14:  1. Normal left main coronary artery. 2. Normal left anterior descending artery and its branches. 3. Normal left circumflex artery and its branches. 4. Normal right coronary artery. 5. Low normal left ventricular systolic function. LVEDP 13 mmHg. Ejection fraction 50%. 6. Normal pulmonary artery pressures as noted above. PA saturation 74%, aortic saturation 98%. Cardiac output 6.8 L/m. Cardiac index 3.45.  Dr. York Spanielilley's notes indicate pt's cardiomyopathy has resolved as of 12/25/14.   If no changes, I anticipate pt can proceed with surgery as scheduled.   Rica Mastngela Rosalynn Sergent, FNP-BC [facility] Phone: [phone] 05/11/2015 12:59 PM

## 2015-05-15 NOTE — H&P (Signed)
PREOPERATIVE H&P  Chief Complaint: R low back pain  HPI: Molly Norton is a 49 y.o. female who presents with ongoing pain in the right side of her low back  Patient has been getting temporary relief with multiple R SI injections over the past 2 years, but her pain has continued  Patient has failed multiple forms of conservative care and continues to have pain (see office notes for additional details regarding the patient's full course of treatment)  Past Medical History  Diagnosis Date  . DDD (degenerative disc disease)     at L4-5 and L5-S1  . Chronic lower back pain   . Cardiomyopathy (HCC) 06/23/2014  . Essential hypertension 06/23/2014  . Obesity (BMI 30-39.9)   . PONV (postoperative nausea and vomiting)   . Family history of adverse reaction to anesthesia     my daughter " came out in a rage."  . CHF (congestive heart failure) (HCC)   . Anemia   . Constipation    Past Surgical History  Procedure Laterality Date  . Nasal sinus surgery    . Cesarean section  1990  . Abdominal hysterectomy    . Laparoscopic ovarian cystectomy      post cyst rupture  . Anterior lumbar fusion  12/19/2010    L4-5 and L5-S1  . Breast lumpectomy    . Left and right heart catheterization with coronary angiogram N/A 06/27/2014    Procedure: LEFT AND RIGHT HEART CATHETERIZATION WITH CORONARY ANGIOGRAM;  Surgeon: Corky CraftsJayadeep S Varanasi, MD;  [facility];  Service: Cardiovascular;  Laterality: N/A;  . Wisdom tooth extraction    . Colonoscopy     Social History   Social History  . Marital Status: Married    Spouse Name: N/A  . Number of Children: N/A  . Years of Education: N/A   Social History Main Topics  . Smoking status: Never Smoker   . Smokeless tobacco: Never Used  . Alcohol Use: Yes     Comment: rare  . Drug Use: No  . Sexual Activity: Not on file   Other Topics Concern  . Not on file   Social History Narrative   Separated, lives alone, marital stress in  past.    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 Hypertension Mother   . Heart disease Father    Allergies  Allergen Reactions  . Adhesive [Tape]     Steri-strips caused blisters  . Latex Itching and Rash  . Lisinopril     Difficult swallowing  . Relafen [Nabumetone] Swelling  . Codeine Itching  . Robaxin [Methocarbamol] Other (See Comments)    Hospital gave it to her she is unsure of reaction   Prior to Admission medications   Medication Sig Start Date End Date Taking? Authorizing Provider  baclofen (LIORESAL) 20 MG tablet TAKE 1 TABLET (20 MG TOTAL) BY MOUTH 4 (FOUR) TIMES DAILY. 02/16/15  Yes Erick ColaceAndrew E Kirsteins, MD  [facility] SENNA 8.6 MG tablet TAKE 2 TABLETS (17.2 MG TOTAL) BY MOUTH DAILY AS NEEDED. Patient taking differently: TAKE 2 TABLETS (17.2 MG TOTAL) BY MOUTH DAILY AS NEEDED FOR CONSTIPATION 07/24/14  Yes Erick ColaceAndrew E Kirsteins, MD  cyclobenzaprine (FLEXERIL) 10 MG tablet Take 1 tablet (10 mg total) by mouth at bedtime. 09/04/14  Yes Jones BalesEunice L Thomas, NP  FLECTOR 1.3 % PTCH PLACE 1 PATCH ONTO THE SKIN 2 (TWO) TIMES DAILY. Patient taking differently:  PLACE 1 PATCH ONTO THE SKIN EVERY OTHER DAY 04/10/15  Yes Erick Colace, MD  furosemide (LASIX) 40 MG tablet Take 40 mg by mouth daily.    Yes Historical Provider, MD  gabapentin (NEURONTIN) 100 MG capsule Take 1 capsule (100 mg total) by mouth 3 (three) times daily. 01/23/15  Yes Erick Colace, MD  Heat Wraps [facility] BACK/HIP) MISC APPLY 1 PATCH EVERY DAY 09/28/14  Yes Erick Colace, MD  hydrALAZINE (APRESOLINE) 50 MG tablet Take 50 mg by mouth 2 (two) times daily.    Yes Historical Provider, MD  hydrochlorothiazide (HYDRODIURIL) 25 MG tablet Take 25 mg by mouth daily.   Yes Historical Provider, MD  lidocaine (LIDODERM) 5 % PLACE 1 PATCH ONTO THE SKIN DAILY. REMOVE & DISCARD PATCH WITHIN 12 HOURS OR AS DIRECTED BY MD Patient taking differently: PLACE 1 PATCH  ONTO THE SKIN EVERY OTHER DAY 01/15/15  Yes Erick Colace, MD  metoprolol succinate (TOPROL-XL) 25 MG 24 hr tablet Take 25 mg by mouth daily.   Yes Historical Provider, MD  nitroGLYCERIN (NITROSTAT) 0.4 MG SL tablet Place 1 tablet (0.4 mg total) under the tongue every 5 (five) minutes as needed for chest pain. 11/20/14  Yes Mirian Mo, MD  oxymorphone (OPANA ER) 20 MG 12 hr tablet Take 1 tablet (20 mg total) by mouth every 12 (twelve) hours. 01/23/15  Yes Erick Colace, MD  polyethylene glycol powder (GLYCOLAX/MIRALAX) powder TAKE 17 G BY MOUTH 2 (TWO) TIMES DAILY. Patient taking differently: TAKE 17 G BY MOUTH DAILY AS NEEDED FOR CONSTIPATION 07/24/14  Yes Erick Colace, MD  spironolactone (ALDACTONE) 25 MG tablet Take 25 mg by mouth daily.   Yes Historical Provider, MD  tiZANidine (ZANAFLEX) 4 MG capsule TAKE 1 CAPSULE (4 MG TOTAL) BY MOUTH 2 (TWO) TIMES DAILY AS NEEDED FOR MUSCLE SPASMS. Patient taking differently: TAKE 4 MG BY MOUTH THREE TIMES A DAY 05/09/15  Yes Erick Colace, MD  zolpidem (AMBIEN) 5 MG tablet Take 1 tablet (5 mg total) by mouth at bedtime as needed. for sleep Patient taking differently: Take 2.5-5 mg by mouth at bedtime. for sleep 01/15/15  Yes Jones Bales, NP  ibuprofen (ADVIL,MOTRIN) 800 MG tablet TAKE 1 TABLET (800 MG TOTAL) BY MOUTH EVERY 8 (EIGHT) HOURS AS NEEDED. 30 DAYS SUPPLY Patient not taking: Reported on 05/10/2015 09/22/13   Erick Colace, MD     All other systems have been reviewed and were otherwise negative with the exception of those mentioned in the HPI and as above.  Physical Exam: There were no vitals filed for this visit.  General: Alert, no acute distress Cardiovascular: No pedal edema Respiratory: No cyanosis, no use of accessory musculature Skin: No lesions in the area of chief complaint Neurologic: Sensation intact distally Psychiatric: Patient is competent for consent with normal mood and affect Lymphatic: No axillary  or cervical lymphadenopathy  MUSCULOSKELETAL: + TTP R low back over the region of her R SI joint  Assessment/Plan: Right sided sacroiliac joint pain Plan for Procedure(s): SACROILIAC JOINT FUSION   Emilee Hero, MD 05/15/2015 8:22 AM

## 2015-05-16 MED ORDER — POVIDONE-IODINE 7.5 % EX SOLN
Freq: Once | CUTANEOUS | Status: DC
  Filled 2015-05-16: qty 118

## 2015-05-16 MED ORDER — CEFAZOLIN SODIUM-DEXTROSE 2-3 GM-% IV SOLR
2.0000 g | INTRAVENOUS | Status: AC
  Administered 2015-05-17: 2 g via INTRAVENOUS
  Filled 2015-05-16: qty 50

## 2015-05-17 ENCOUNTER — Ambulatory Visit (HOSPITAL_COMMUNITY): Admitting: Emergency Medicine

## 2015-05-17 ENCOUNTER — Ambulatory Visit (HOSPITAL_COMMUNITY)
Admission: RE | Admit: 2015-05-17 | Discharge: 2015-05-17 | Disposition: A | Discharge status: Home / Self Care | Source: Ambulatory Visit | Attending: Orthopedic Surgery | Admitting: Orthopedic Surgery

## 2015-05-17 ENCOUNTER — Ambulatory Visit (HOSPITAL_COMMUNITY)

## 2015-05-17 ENCOUNTER — Encounter (HOSPITAL_COMMUNITY)
Admission: RE | Disposition: A | Discharge status: Home / Self Care | Payer: Self-pay | Source: Ambulatory Visit | Attending: Orthopedic Surgery

## 2015-05-17 ENCOUNTER — Encounter (HOSPITAL_COMMUNITY): Payer: Self-pay | Admitting: *Deleted

## 2015-05-17 ENCOUNTER — Ambulatory Visit (HOSPITAL_COMMUNITY): Admitting: Anesthesiology

## 2015-05-17 DIAGNOSIS — I429 Cardiomyopathy, unspecified: Secondary | ICD-10-CM | POA: Insufficient documentation

## 2015-05-17 DIAGNOSIS — Z79899 Other long term (current) drug therapy: Secondary | ICD-10-CM | POA: Diagnosis not present

## 2015-05-17 DIAGNOSIS — E669 Obesity, unspecified: Secondary | ICD-10-CM | POA: Diagnosis not present

## 2015-05-17 DIAGNOSIS — M5137 Other intervertebral disc degeneration, lumbosacral region: Secondary | ICD-10-CM | POA: Diagnosis not present

## 2015-05-17 DIAGNOSIS — M533 Sacrococcygeal disorders, not elsewhere classified: Secondary | ICD-10-CM | POA: Diagnosis not present

## 2015-05-17 DIAGNOSIS — I110 Hypertensive heart disease with heart failure: Secondary | ICD-10-CM | POA: Diagnosis not present

## 2015-05-17 DIAGNOSIS — Z6831 Body mass index (BMI) 31.0-31.9, adult: Secondary | ICD-10-CM | POA: Diagnosis not present

## 2015-05-17 DIAGNOSIS — I509 Heart failure, unspecified: Secondary | ICD-10-CM | POA: Diagnosis not present

## 2015-05-17 DIAGNOSIS — Z79891 Long term (current) use of opiate analgesic: Secondary | ICD-10-CM | POA: Diagnosis not present

## 2015-05-17 DIAGNOSIS — Z419 Encounter for procedure for purposes other than remedying health state, unspecified: Secondary | ICD-10-CM

## 2015-05-17 HISTORY — PX: SACROILIAC JOINT FUSION: SHX6088

## 2015-05-17 SURGERY — SACROILIAC JOINT FUSION
Anesthesia: General | Site: Back | Laterality: Right

## 2015-05-17 MED ORDER — ONDANSETRON HCL 4 MG/2ML IJ SOLN
INTRAMUSCULAR | Status: AC
  Filled 2015-05-17: qty 2

## 2015-05-17 MED ORDER — SODIUM CHLORIDE 0.9 % IV SOLN
10.0000 mg | INTRAVENOUS | Status: DC | PRN
  Administered 2015-05-17: 10 ug/min via INTRAVENOUS

## 2015-05-17 MED ORDER — LACTATED RINGERS IV SOLN
INTRAVENOUS | Status: DC
  Administered 2015-05-17: 11:00:00 via INTRAVENOUS

## 2015-05-17 MED ORDER — ROCURONIUM BROMIDE 50 MG/5ML IV SOLN
INTRAVENOUS | Status: AC
  Filled 2015-05-17: qty 2

## 2015-05-17 MED ORDER — LACTATED RINGERS IV SOLN
INTRAVENOUS | Status: DC | PRN
  Administered 2015-05-17 (×2): via INTRAVENOUS

## 2015-05-17 MED ORDER — LIDOCAINE HCL (CARDIAC) 20 MG/ML IV SOLN
INTRAVENOUS | Status: DC | PRN
  Administered 2015-05-17: 50 mg via INTRAVENOUS

## 2015-05-17 MED ORDER — MIDAZOLAM HCL 5 MG/ML IJ SOLN: 2.0000 mg | Freq: Once | INTRAMUSCULAR | Status: DC

## 2015-05-17 MED ORDER — SUCCINYLCHOLINE CHLORIDE 20 MG/ML IJ SOLN
INTRAMUSCULAR | Status: AC
  Filled 2015-05-17: qty 1

## 2015-05-17 MED ORDER — FENTANYL CITRATE (PF) 100 MCG/2ML IJ SOLN
50.0000 ug | Freq: Once | INTRAMUSCULAR | Status: DC
Start: 2015-05-17 — End: 2015-05-17

## 2015-05-17 MED ORDER — ARTIFICIAL TEARS OP OINT
TOPICAL_OINTMENT | OPHTHALMIC | Status: DC | PRN
  Administered 2015-05-17: 1 via OPHTHALMIC

## 2015-05-17 MED ORDER — FENTANYL CITRATE (PF) 100 MCG/2ML IJ SOLN
25.0000 ug | INTRAMUSCULAR | Status: DC | PRN
  Administered 2015-05-17 (×4): 50 ug via INTRAVENOUS

## 2015-05-17 MED ORDER — MIDAZOLAM HCL 5 MG/5ML IJ SOLN
INTRAMUSCULAR | Status: DC | PRN
  Administered 2015-05-17: 2 mg via INTRAVENOUS

## 2015-05-17 MED ORDER — TIZANIDINE HCL 4 MG PO CAPS
ORAL_CAPSULE | ORAL | Status: DC
Start: 2015-05-17 — End: 2015-08-13

## 2015-05-17 MED ORDER — FENTANYL CITRATE (PF) 100 MCG/2ML IJ SOLN
INTRAMUSCULAR | Status: AC
  Filled 2015-05-17: qty 2

## 2015-05-17 MED ORDER — SODIUM CHLORIDE 0.9 % IJ SOLN
INTRAMUSCULAR | Status: AC
  Filled 2015-05-17: qty 10

## 2015-05-17 MED ORDER — 0.9 % SODIUM CHLORIDE (POUR BTL) OPTIME
TOPICAL | Status: DC | PRN
  Administered 2015-05-17: 1000 mL

## 2015-05-17 MED ORDER — CYCLOBENZAPRINE HCL 10 MG PO TABS: 10.0000 mg | ORAL_TABLET | Freq: Every day | ORAL | Status: DC

## 2015-05-17 MED ORDER — EPHEDRINE SULFATE 50 MG/ML IJ SOLN
INTRAMUSCULAR | Status: AC
  Filled 2015-05-17: qty 1

## 2015-05-17 MED ORDER — BUPIVACAINE-EPINEPHRINE (PF) 0.25% -1:200000 IJ SOLN
INTRAMUSCULAR | Status: AC
  Filled 2015-05-17: qty 30

## 2015-05-17 MED ORDER — MIDAZOLAM HCL 2 MG/2ML IJ SOLN
INTRAMUSCULAR | Status: AC
  Administered 2015-05-17: 2 mg via INTRAVENOUS
  Filled 2015-05-17: qty 2

## 2015-05-17 MED ORDER — ONDANSETRON HCL 4 MG/2ML IJ SOLN: 4.0000 mg | Freq: Once | INTRAMUSCULAR | Status: DC | PRN

## 2015-05-17 MED ORDER — ONDANSETRON HCL 4 MG/2ML IJ SOLN
4.0000 mg | Freq: Once | INTRAMUSCULAR | Status: AC
  Administered 2015-05-17: 4 mg via INTRAVENOUS
  Filled 2015-05-17: qty 2

## 2015-05-17 MED ORDER — FENTANYL CITRATE (PF) 100 MCG/2ML IJ SOLN
INTRAMUSCULAR | Status: DC | PRN
  Administered 2015-05-17: 100 ug via INTRAVENOUS
  Administered 2015-05-17: 50 ug via INTRAVENOUS
  Administered 2015-05-17: 150 ug via INTRAVENOUS
  Administered 2015-05-17 (×2): 100 ug via INTRAVENOUS

## 2015-05-17 MED ORDER — BUPIVACAINE-EPINEPHRINE (PF) 0.25% -1:200000 IJ SOLN
INTRAMUSCULAR | Status: DC | PRN
  Administered 2015-05-17: 25 mL via PERINEURAL

## 2015-05-17 MED ORDER — LIDOCAINE HCL (CARDIAC) 20 MG/ML IV SOLN
INTRAVENOUS | Status: AC
  Filled 2015-05-17: qty 5

## 2015-05-17 MED ORDER — SUGAMMADEX SODIUM 200 MG/2ML IV SOLN
INTRAVENOUS | Status: DC | PRN
  Administered 2015-05-17: 180 mg via INTRAVENOUS

## 2015-05-17 MED ORDER — ONDANSETRON HCL 4 MG/2ML IJ SOLN
INTRAMUSCULAR | Status: DC | PRN
  Administered 2015-05-17: 4 mg via INTRAVENOUS

## 2015-05-17 MED ORDER — ROCURONIUM BROMIDE 100 MG/10ML IV SOLN
INTRAVENOUS | Status: DC | PRN
  Administered 2015-05-17: 50 mg via INTRAVENOUS

## 2015-05-17 MED ORDER — OXYMORPHONE HCL ER 20 MG PO TB12: 20.0000 mg | ORAL_TABLET | Freq: Two times a day (BID) | ORAL | Status: DC

## 2015-05-17 MED ORDER — PROPOFOL 10 MG/ML IV BOLUS
INTRAVENOUS | Status: DC | PRN
  Administered 2015-05-17: 200 mg via INTRAVENOUS

## 2015-05-17 SURGICAL SUPPLY — 55 items
BENZOIN TINCTURE PRP APPL 2/3 (GAUZE/BANDAGES/DRESSINGS) ×2 IMPLANT
BLADE SURG 10 STRL SS (BLADE) ×2 IMPLANT
BLADE SURG ROTATE 9660 (MISCELLANEOUS) ×2 IMPLANT
CANISTER SUCTION 2500CC (MISCELLANEOUS) ×2 IMPLANT
CAP-I-FUSE IMPLANT SYSTEM ×2 IMPLANT
CLSR STERI-STRIP ANTIMIC 1/2X4 (GAUZE/BANDAGES/DRESSINGS) ×2 IMPLANT
COVER SURGICAL LIGHT HANDLE (MISCELLANEOUS) ×4 IMPLANT
DRAPE C-ARM 42X72 X-RAY (DRAPES) ×2 IMPLANT
DRAPE C-ARMOR (DRAPES) ×2 IMPLANT
DRAPE INCISE IOBAN 66X45 STRL (DRAPES) ×2 IMPLANT
DRAPE POUCH INSTRU U-SHP 10X18 (DRAPES) ×2 IMPLANT
DRAPE SURG 17X23 STRL (DRAPES) ×6 IMPLANT
DURAPREP 26ML APPLICATOR (WOUND CARE) ×2 IMPLANT
ELECT CAUTERY BLADE 6.4 (BLADE) ×2 IMPLANT
ELECT REM PT RETURN 9FT ADLT (ELECTROSURGICAL) ×2
ELECTRODE REM PT RTRN 9FT ADLT (ELECTROSURGICAL) ×1 IMPLANT
GAUZE SPONGE 4X4 12PLY STRL (GAUZE/BANDAGES/DRESSINGS) ×2 IMPLANT
GAUZE SPONGE 4X4 16PLY XRAY LF (GAUZE/BANDAGES/DRESSINGS) ×2 IMPLANT
GLOVE BIO SURGEON STRL SZ7 (GLOVE) ×2 IMPLANT
GLOVE BIO SURGEON STRL SZ8 (GLOVE) ×2 IMPLANT
GLOVE BIOGEL PI IND STRL 7.0 (GLOVE) ×2 IMPLANT
GLOVE BIOGEL PI IND STRL 8 (GLOVE) ×1 IMPLANT
GLOVE BIOGEL PI INDICATOR 7.0 (GLOVE) ×2
GLOVE BIOGEL PI INDICATOR 8 (GLOVE) ×1
GOWN STRL REUS W/ TWL LRG LVL3 (GOWN DISPOSABLE) ×2 IMPLANT
GOWN STRL REUS W/ TWL XL LVL3 (GOWN DISPOSABLE) ×1 IMPLANT
GOWN STRL REUS W/TWL LRG LVL3 (GOWN DISPOSABLE) ×2
GOWN STRL REUS W/TWL XL LVL3 (GOWN DISPOSABLE) ×1
KIT BASIN OR (CUSTOM PROCEDURE TRAY) ×2 IMPLANT
KIT ROOM TURNOVER OR (KITS) ×2 IMPLANT
MANIFOLD NEPTUNE II (INSTRUMENTS) ×2 IMPLANT
NEEDLE 22X1 1/2 (OR ONLY) (NEEDLE) ×2 IMPLANT
NEEDLE HYPO 25GX1X1/2 BEV (NEEDLE) ×2 IMPLANT
NS IRRIG 1000ML POUR BTL (IV SOLUTION) ×2 IMPLANT
PACK UNIVERSAL I (CUSTOM PROCEDURE TRAY) ×2 IMPLANT
PAD ARMBOARD 7.5X6 YLW CONV (MISCELLANEOUS) ×4 IMPLANT
PENCIL BUTTON HOLSTER BLD 10FT (ELECTRODE) ×2 IMPLANT
SPONGE GAUZE 4X4 12PLY STER LF (GAUZE/BANDAGES/DRESSINGS) ×2 IMPLANT
SPONGE LAP 18X18 X RAY DECT (DISPOSABLE) ×2 IMPLANT
STAPLER VISISTAT 35W (STAPLE) ×2 IMPLANT
STRIP CLOSURE SKIN 1/2X4 (GAUZE/BANDAGES/DRESSINGS) ×2 IMPLANT
SUT MNCRL AB 4-0 PS2 18 (SUTURE) ×2 IMPLANT
SUT VIC AB 0 CT1 18XCR BRD 8 (SUTURE) IMPLANT
SUT VIC AB 0 CT1 8-18 (SUTURE)
SUT VIC AB 1 CT1 18XCR BRD 8 (SUTURE) ×1 IMPLANT
SUT VIC AB 1 CT1 8-18 (SUTURE) ×1
SUT VIC AB 2-0 CT2 18 VCP726D (SUTURE) ×2 IMPLANT
SYR BULB IRRIGATION 50ML (SYRINGE) ×4 IMPLANT
SYR CONTROL 10ML LL (SYRINGE) ×2 IMPLANT
TAPE CLOTH SURG 4X10 WHT LF (GAUZE/BANDAGES/DRESSINGS) ×2 IMPLANT
TOWEL OR 17X24 6PK STRL BLUE (TOWEL DISPOSABLE) ×2 IMPLANT
TOWEL OR 17X26 10 PK STRL BLUE (TOWEL DISPOSABLE) ×4 IMPLANT
TUBE CONNECTING 12X1/4 (SUCTIONS) ×2 IMPLANT
WATER STERILE IRR 1000ML POUR (IV SOLUTION) ×2 IMPLANT
YANKAUER SUCT BULB TIP NO VENT (SUCTIONS) ×2 IMPLANT

## 2015-05-17 NOTE — Anesthesia Preprocedure Evaluation (Signed)
Anesthesia Evaluation  Patient identified by MRN, date of birth, ID band Patient awake    Reviewed: Allergy & Precautions, NPO status , Patient's Chart, lab work & pertinent test results  Airway Mallampati: II  TM Distance: >3 FB Neck ROM: Full    Dental  (+) Teeth Intact, Dental Advisory Given   Pulmonary    breath sounds clear to auscultation       Cardiovascular hypertension,  Rhythm:Regular Rate:Normal     Neuro/Psych    GI/Hepatic   Endo/Other    Renal/GU      Musculoskeletal   Abdominal   Peds  Hematology   Anesthesia Other Findings   Reproductive/Obstetrics                             Anesthesia Physical Anesthesia Plan  ASA: III  Anesthesia Plan: General   Post-op Pain Management:    Induction: Intravenous  Airway Management Planned: Oral ETT  Additional Equipment:   Intra-op Plan:   Post-operative Plan: Extubation in OR  Informed Consent: I have reviewed the patients History and Physical, chart, labs and discussed the procedure including the risks, benefits and alternatives for the proposed anesthesia with the patient or authorized representative who has indicated his/her understanding and acceptance.   Dental advisory given  Plan Discussed with: CRNA and Anesthesiologist  Anesthesia Plan Comments:         Anesthesia Quick Evaluation  

## 2015-05-17 NOTE — Progress Notes (Signed)
Spoke with Dr. Noreene LarssonJoslin concerning contraindication of versed order with pt. Intolerance to valium. Okay to give per Dr. Noreene LarssonJoslin.

## 2015-05-17 NOTE — Anesthesia Postprocedure Evaluation (Signed)
Anesthesia Post Note  Patient: Molly RomeKimberly K Lehew  Procedure(s) Performed: Procedure(s) (LRB): SACROILIAC JOINT FUSION (Right)  Patient location during evaluation: PACU Anesthesia Type: General Level of consciousness: awake and awake and alert Pain management: pain level controlled Vital Signs Assessment: post-procedure vital signs reviewed and stable Respiratory status: spontaneous breathing and nonlabored ventilation Anesthetic complications: no    Last Vitals:  Filed Vitals:   05/17/15 1651 05/17/15 1656  BP: 129/70   Pulse:  66  Temp:    Resp:  13    Last Pain:  Filed Vitals:   05/17/15 1659  PainSc: Asleep                 Dashan Chizmar COKER

## 2015-05-17 NOTE — Progress Notes (Signed)
Dr Noreene LarssonJoslin called to bedside by Ilda MoriAngel Britt, RN.  Precedex given IV for pain intolerance.  Dr. Yevette Edwardsumonski at bedside to evaluate patient, prescriptions given.  Patient plan to discharge is unchanged at this time.

## 2015-05-17 NOTE — Progress Notes (Signed)
Orthopedic Tech Progress Note Patient Details:  Molly RomeKimberly K Norton Oct 19, 1965 [id number]  Ortho Devices Type of Ortho Device: Crutches Ortho Device/Splint Interventions: Application   Saul FordyceJennifer C Carmita Boom 05/17/2015, 6:15 PM

## 2015-05-17 NOTE — Transfer of Care (Signed)
Immediate Anesthesia Transfer of Care Note  Patient: Molly RomeKimberly K Parrillo  Procedure(s) Performed: Procedure(s) with comments: SACROILIAC JOINT FUSION (Right) - Right sided sacroiliac joint fusion  Patient Location: PACU  Anesthesia Type:General  Level of Consciousness: awake, oriented, sedated, patient cooperative and responds to stimulation  Airway & Oxygen Therapy: Patient Spontanous Breathing and Patient connected to nasal cannula oxygen  Post-op Assessment: Report given to RN, Post -op Vital signs reviewed and stable, Patient moving all extremities and Patient moving all extremities X 4  Post vital signs: Reviewed and stable  Last Vitals:  Filed Vitals:   05/17/15 1058 05/17/15 1450  BP: 133/62   Pulse: 86   Temp: 37.1 C 36.6 C  Resp: 20     Complications: No apparent anesthesia complications

## 2015-05-17 NOTE — Discharge Instructions (Signed)
°What to eat: ° °For your first meals, you should eat lightly; only small meals initially.  If you do not have nausea, you may eat larger meals.  Avoid spicy, greasy and heavy food.   ° °General Anesthesia, Adult, Care After  °Refer to this sheet in the next few weeks. These instructions provide you with information on caring for yourself after your procedure. Your health care provider may also give you more specific instructions. Your treatment has been planned according to current medical practices, but problems sometimes occur. Call your health care provider if you have any problems or questions after your procedure.  °WHAT TO EXPECT AFTER THE PROCEDURE  °After the procedure, it is typical to experience:  °Sleepiness.  °Nausea and vomiting. °HOME CARE INSTRUCTIONS  °For the first 24 hours after general anesthesia:  °Have a responsible person with you.  °Do not drive a car. If you are alone, do not take public transportation.  °Do not drink alcohol.  °Do not take medicine that has not been prescribed by your health care provider.  °Do not sign important papers or make important decisions.  °You may resume a normal diet and activities as directed by your health care provider.  °Change bandages (dressings) as directed.  °If you have questions or problems that seem related to general anesthesia, call the hospital and ask for the anesthetist or anesthesiologist on call. °SEEK MEDICAL CARE IF:  °You have nausea and vomiting that continue the day after anesthesia.  °You develop a rash. °SEEK IMMEDIATE MEDICAL CARE IF:  °You have difficulty breathing.  °You have chest pain.  °You have any allergic problems. °Document Released: 09/01/2000 Document Revised: 01/26/2013 Document Reviewed: 12/09/2012  °ExitCare® Patient Information ©2014 [employer].  ° °Sore Throat  ° ° °A sore throat is a painful, burning, sore, or scratchy feeling of the throat. There may be pain or tenderness when swallowing or talking. You may have  other symptoms with a sore throat. These include coughing, sneezing, fever, or a swollen neck. A sore throat is often the first sign of another sickness. These sicknesses may include a cold, flu, strep throat, or an infection called mono. Most sore throats go away without medical treatment.  °HOME CARE  °Only take medicine as told by your doctor.  °Drink enough fluids to keep your pee (urine) clear or pale yellow.  °Rest as needed.  °Try using throat sprays, lozenges, or suck on hard candy (if older than 4 years or as told).  °Sip warm liquids, such as broth, herbal tea, or warm water with honey. Try sucking on frozen ice pops or drinking cold liquids.  °Rinse the mouth (gargle) with salt water. Mix 1 teaspoon salt with 8 ounces of water.  °Do not smoke. Avoid being around others when they are smoking.  °Put a humidifier in your bedroom at night to moisten the air. You can also turn on a hot shower and sit in the bathroom for 5-10 minutes. Be sure the bathroom door is closed. °GET HELP RIGHT AWAY IF:  °You have trouble breathing.  °You cannot swallow fluids, soft foods, or your spit (saliva).  °You have more puffiness (swelling) in the throat.  °Your sore throat does not get better in 7 days.  °You feel sick to your stomach (nauseous) and throw up (vomit).  °You have a fever or lasting symptoms for more than 2-3 days.  °You have a fever and your symptoms suddenly get worse. °MAKE SURE YOU:  °Understand these   instructions.  °Will watch your condition.  °Will get help right away if you are not doing well or get worse. °Document Released: 03/04/2008 Document Revised: 02/18/2012 Document Reviewed: 02/01/2012  °ExitCare® Patient Information ©2015 [employer]. This information is not intended to replace advice given to you by your health care provider. Make sure you discuss any questions you have with your health care provider.  ° ° ° °

## 2015-05-17 NOTE — Progress Notes (Signed)
Dr. Maple HudsonMoser called to make aware that pt reporting increase in anxiety but valium has opposite effect; therefore, unable to give versed per Dr. Maple HudsonMoser. No new orders received. Pt also made aware of call to Dr. Maple HudsonMoser. Attempted to calm pt through deep breathing, television on in room and spouse at bedside.

## 2015-05-17 NOTE — Anesthesia Procedure Notes (Signed)
Procedure Name: Intubation Date/Time: 05/17/2015 1:13 PM Performed by: Wray KearnsFOLEY, Arnella Pralle A Pre-anesthesia Checklist: Patient identified, Emergency Drugs available, Suction available, Patient being monitored and Timeout performed Patient Re-evaluated:Patient Re-evaluated prior to inductionOxygen Delivery Method: Circle system utilized Preoxygenation: Pre-oxygenation with 100% oxygen Intubation Type: IV induction and Cricoid Pressure applied Ventilation: Mask ventilation without difficulty Laryngoscope Size: Mac and 4 Grade View: Grade I Tube type: Oral Tube size: 7.5 mm Number of attempts: 1 Airway Equipment and Method: Stylet Placement Confirmation: ETT inserted through vocal cords under direct vision,  positive ETCO2 and breath sounds checked- equal and bilateral Secured at: 22 cm Tube secured with: Tape Dental Injury: Teeth and Oropharynx as per pre-operative assessment

## 2015-05-18 ENCOUNTER — Encounter (HOSPITAL_COMMUNITY): Payer: Self-pay | Admitting: Orthopedic Surgery

## 2015-05-18 ENCOUNTER — Telehealth: Payer: Self-pay | Admitting: *Deleted

## 2015-05-18 MED ORDER — OXYMORPHONE HCL ER 20 MG PO TB12: 20.0000 mg | ORAL_TABLET | Freq: Two times a day (BID) | ORAL | Status: DC

## 2015-05-18 NOTE — Op Note (Signed)
NAMMarlou Porch:  Cronce, Shahd           ACCOUNT NO.:  [id number]  MEDICAL RECORD NO.:  [id number]  LOCATION:  [facility]                         FACILITY:  [facility]  PHYSICIAN:  Estill BambergMark Sera Hitsman, MD      DATE OF BIRTH:  1966/03/08  DATE OF PROCEDURE:  05/17/2015 DATE OF DISCHARGE:  05/17/2015                              OPERATIVE REPORT   PREOPERATIVE DIAGNOSIS:  Right-sided sacroiliac joint dysfunction.  POSTOPERATIVE DIAGNOSIS:  Right-sided sacroiliac joint dysfunction.  PROCEDURE:  Right-sided sacroiliac joint fusion using the iFuse instrumentation system.  SURGEON:  Estill BambergMark Akito Boomhower, MD  ASSISTANT:  Jason CoopKayla McKenzie, PA-C  ANESTHESIA:  General endotracheal anesthesia.  COMPLICATIONS:  None.  DISPOSITION:  Stable.  ESTIMATED BLOOD LOSS:  Minimal.  INDICATIONS FOR SURGERY:  Briefly, Molly Norton is a very pleasant 49- year-old female, who has been having ongoing and rather chronic pain at the right side of her low back.  She did get excellent temporary relief with multiple interventions related to the right SI joint.  The patient's pain was ongoing.  We did discuss proceeding with the procedure reflected above.  The patient was fully aware of the risks and limitations of the procedure, and did elect to proceed with surgery.  OPERATIVE DETAILS:  On May 17, 2015, the patient was brought to surgery and general endotracheal anesthesia was administered.  The patient was placed prone on a well-padded flat Jackson bed.  Gel rolls were placed under the patient's chest and hips.  All bony prominences were meticulously padded.  The region of the right buttock was then prepped and draped in the usual sterile fashion.  A time-out procedure was performed.  I then made a 3-cm incision over the posterior border of the sacrum.  I then advanced 3 guidewires across the sacroiliac joint liberally using lateral inlet and outlet fluoroscopy.  One guidewire was placed above the S1 foramen, one was in line  with it, and one was just beneath it.  I then drilled and broached over the guidewires.  I then advanced 7 mm sacroiliac joint implants over the guidewires.  I was very pleased with the press-fit of each of the implants on the appearance on AP and lateral fluoroscopy.  Of note, there was no penetration of the S1 or S2 foramina.  The guidewires were then removed.  I was very pleased with the final fluoroscopic images.  The wound was then copiously irrigated with approximately 500 mL of normal saline.  The fascia was then closed using #1 Vicryl and the subcutaneous layer was closed using 0 Vicryl followed by 2-0 Vicryl, and the skin was then closed using 3-0 Monocryl.  Benzoin and Steri-Strips were applied followed by sterile dressing.  All instrument counts were correct at the termination of the procedure.  Of note, Jason CoopKayla McKenzie was my assistant throughout surgery, and did aid in retraction, suctioning, and closure from start to finish.     Estill BambergMark Dinesh Ulysse, MD     MD/MEDQ  D:  05/17/2015  T:  05/17/2015  Job:  [id number]

## 2015-05-18 NOTE — Telephone Encounter (Signed)
Molly Norton called and said that she had surgery by Dr Yevette Edwardsumonski yesterday and he says that she must get her pain medication from Dr Wynn BankerKirsteins because of being under contract.  Molly Norton has not been seen since 01/23/15.  She left upset at that appt because it was too early to receive botox which she was planning on having that day.  She stated at that time she did not want to come back to this office for treatment.  I spoke with Molly Norton and she is denying saying that she did not want to come back.  I questioned her about why she had not mad an appt to get refills on her medication since then and she says it is because we had not reached out to her to give her an appointment.  I explained that it is not our responsibility to call and schedule appts, especially in light of her last visit, but that she would be the one responsible for making the appointments.  I did tell her that we had attempted to reach her several times and there was not answer and no identifier on the voicemail so no messages left. She denies this as well.  I talked with Dr Wynn BankerKirsteins and he agrees to prescribe one month of her medication but if she wants to continue she will have to be seen monthly. She is in agreement with this.  She received 20 mg Opana ER on 05/17/15  from another provider but only 5 days worth of pills.  We will give her a 30 day supply of the opana ER 20 mg and she will need to be seen in the office by Dr Wynn BankerKirsteins by 06/20/15 to avoid being out of meds. I have given the information to our practice manager Isidor HoltsJohnette Shultz and she will call Ms Estell HarpinCuthbert.

## 2015-06-15 ENCOUNTER — Ambulatory Visit (HOSPITAL_BASED_OUTPATIENT_CLINIC_OR_DEPARTMENT_OTHER): Admitting: Physical Medicine & Rehabilitation

## 2015-06-15 ENCOUNTER — Encounter: Attending: Physical Medicine & Rehabilitation

## 2015-06-15 ENCOUNTER — Encounter: Payer: Self-pay | Admitting: Physical Medicine & Rehabilitation

## 2015-06-15 VITALS — BP 157/97 | HR 105 | Resp 14

## 2015-06-15 DIAGNOSIS — Z79899 Other long term (current) drug therapy: Secondary | ICD-10-CM

## 2015-06-15 DIAGNOSIS — I509 Heart failure, unspecified: Secondary | ICD-10-CM | POA: Insufficient documentation

## 2015-06-15 DIAGNOSIS — Z6839 Body mass index (BMI) 39.0-39.9, adult: Secondary | ICD-10-CM | POA: Insufficient documentation

## 2015-06-15 DIAGNOSIS — I10 Essential (primary) hypertension: Secondary | ICD-10-CM | POA: Diagnosis not present

## 2015-06-15 DIAGNOSIS — G894 Chronic pain syndrome: Secondary | ICD-10-CM

## 2015-06-15 DIAGNOSIS — Z5181 Encounter for therapeutic drug level monitoring: Secondary | ICD-10-CM

## 2015-06-15 DIAGNOSIS — M62838 Other muscle spasm: Secondary | ICD-10-CM

## 2015-06-15 DIAGNOSIS — I429 Cardiomyopathy, unspecified: Secondary | ICD-10-CM | POA: Diagnosis not present

## 2015-06-15 DIAGNOSIS — M5136 Other intervertebral disc degeneration, lumbar region: Secondary | ICD-10-CM | POA: Diagnosis not present

## 2015-06-15 DIAGNOSIS — E669 Obesity, unspecified: Secondary | ICD-10-CM | POA: Diagnosis not present

## 2015-06-15 DIAGNOSIS — M961 Postlaminectomy syndrome, not elsewhere classified: Secondary | ICD-10-CM

## 2015-06-15 DIAGNOSIS — M533 Sacrococcygeal disorders, not elsewhere classified: Secondary | ICD-10-CM

## 2015-06-15 MED ORDER — OXYMORPHONE HCL ER 20 MG PO TB12: 20.0000 mg | ORAL_TABLET | Freq: Two times a day (BID) | ORAL | Status: DC

## 2015-06-15 MED ORDER — CYCLOBENZAPRINE HCL 10 MG PO TABS: 10.0000 mg | ORAL_TABLET | Freq: Every day | ORAL | Status: DC

## 2015-06-15 MED ORDER — DICLOFENAC EPOLAMINE 1.3 % TD PTCH: MEDICATED_PATCH | TRANSDERMAL | Status: DC

## 2015-06-15 MED ORDER — ZOLPIDEM TARTRATE 5 MG PO TABS: 5.0000 mg | ORAL_TABLET | Freq: Every evening | ORAL | Status: DC | PRN

## 2015-06-15 NOTE — Patient Instructions (Signed)
No Botox injections until you complete PT and get the OK from Dr Yevette Edwardsumonski

## 2015-06-15 NOTE — Progress Notes (Signed)
Subjective:    Patient ID: Molly Norton, female    DOB: [birth year], 50 y.o.   MRN: [id number]  HPI 50 year old female with history of lumbar postlaminectomy syndrome status post lumbar fusion. She has developed sacroiliac disorder. She had partial relief on temporary basis with sacroiliac injection. She did end up having a sacroiliac fusion on 05/17/2015, Dr. Yevette Edwards.  Patient has had good postoperative recovery. She feels like the surgery will be helping her once she recovers. She is now bearing full weight on the right leg. Still uses a crutch to help her.  Patient started physical therapy yesterday. Working on Investment banker, operational and then progressed to strengthening.  Next visit with Dr. Yevette Edwards is on 06/29/2015, Patient is still out of work.  Last visit 01/23/2015. Patient was upset that she did not receive Botox at that time but it was too early. She also was upset that we required patient visit in order to feel narcotic analgesics. We did discuss that strong narcotics such as Opana require monthly visits                                                                                                                                                              Pain Inventory Average Pain 7 Pain Right Now 8 My pain is sharp, burning, dull, stabbing, tingling and aching  In the last 24 hours, has pain interfered with the following? General activity 8 Relation with others 8 Enjoyment of life 8 What TIME of day is your pain at its worst? all Sleep (in general) Poor  Pain is worse with: walking, bending, sitting, inactivity, standing and some activites Pain improves with: rest, heat/ice, therapy/exercise, pacing activities, medication and injections Relief from Meds: 8  Mobility walk with assistance use a cane use a walker ability to climb steps?  no do you drive?  no use a wheelchair needs help with transfers Do you have any goals in this area?  yes  Function Do you have  any goals in this area?  yes  Neuro/Psych bladder control problems numbness tingling trouble walking spasms  Prior Studies Any changes since last visit?  no  Physicians involved in your care Any changes since last visit?  no   Family History  Problem Relation Age of Onset  . Hypertension Mother   . Heart disease Father    Social History   Social History  . Marital Status: Married    Spouse Name: N/A  . Number of Children: N/A  . Years of Education: N/A   Social History Main Topics  . Smoking status: Never Smoker   . Smokeless tobacco: Never Used  . Alcohol Use: Yes     Comment: rare  . Drug Use: No  . Sexual Activity: Not Asked   Other  Topics Concern  . None   Social History Narrative   Separated, lives alone, marital stress in past.    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 Nasal sinus surgery    . Cesarean section  1990  . Abdominal hysterectomy    . Laparoscopic ovarian cystectomy      post cyst rupture  . Anterior lumbar fusion  12/19/2010    L4-5 and L5-S1  . Breast lumpectomy    . Left and right heart catheterization with coronary angiogram N/A 06/27/2014    Procedure: LEFT AND RIGHT HEART CATHETERIZATION WITH CORONARY ANGIOGRAM;  Surgeon: Corky Crafts, MD;  Location: [facility] CATH LAB;  Service: Cardiovascular;  Laterality: N/A;  . Wisdom tooth extraction    . Colonoscopy    . Sacroiliac joint fusion Right 05/17/2015    Procedure: SACROILIAC JOINT FUSION;  Surgeon: Estill Bamberg, MD;  Location: [facility] OR;  Service: Orthopedics;  Laterality: Right;  Right sided sacroiliac joint fusion   Past Medical History  Diagnosis Date  . DDD (degenerative disc disease)     at L4-5 and L5-S1  . Chronic lower back pain   . Cardiomyopathy (HCC) 06/23/2014  . Essential hypertension 06/23/2014  . Obesity (BMI 30-39.9)   . PONV (postoperative nausea and vomiting)   . Family history of adverse  reaction to anesthesia     my daughter " came out in a rage."  . CHF (congestive heart failure) (HCC)   . Anemia   . Constipation    BP 157/97 mmHg  Pulse 105  Resp 14  SpO2 98%  Opioid Risk Score:   Fall Risk Score:  `1  Depression screen PHQ 2/9  Depression screen [facility] 2/9 01/23/2015 01/04/2015 12/26/2014 09/04/2014  Decreased Interest 2 3 3  0  Down, Depressed, Hopeless 2 3 3  0  PHQ - 2 Score 4 6 6  0  Altered sleeping - 3 2 2   Tired, decreased energy - 1 1 2   Change in appetite - 0 0 0  Feeling bad or failure about yourself  - 0 0 0  Trouble concentrating - 0 0 0  Moving slowly or fidgety/restless - 0 0 0  Suicidal thoughts - 0 0 0  PHQ-9 Score - 10 9 4   Difficult doing work/chores - - Not difficult at all -     Review of Systems  Cardiovascular: Positive for leg swelling.  Gastrointestinal: Positive for constipation.  All other systems reviewed and are negative.      Objective:   Physical Exam  Constitutional: She is oriented to person, place, and time. She appears well-developed and well-nourished.  HENT:  Head: Normocephalic and atraumatic.  Neurological: She is alert and oriented to person, place, and time. She has normal strength.  Reflex Scores:      Patellar reflexes are 0 on the right side and 1+ on the left side.      Achilles reflexes are 2+ on the right side and 2+ on the left side. 5/5 in BLE  Psychiatric: She has a normal mood and affect.  Nursing note and vitals reviewed.   Tenderness to palpation lumbar paraspinals. There is a healing incision in the right gluteus medius area. Lumbar spine range of motion is diminished 25% or flexion extension lateral bending.      Assessment & Plan:  1.  Lumbar postlaminectomy syndrome with chronic postoperative pain. She has no significant radicular symptoms at the current time.  2. 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  Overall it appears that patient's  symptoms are improving but she is still recovering from her surgery. At this point I would continue the Opana ER 20 g twice a day and once she finishes her rehabilitation we can start weaning this down  Also in terms of the Botox injections I would want her to first heal up from her surgery and finish her rehabilitation. If she still continues to have muscle spasms we can resume the Botox injections.  Over half of the 25 min visit was spent counseling and coordinating care.

## 2015-06-22 LAB — TOXASSURE SELECT,+ANTIDEPR,UR: PDF: 0

## 2015-06-22 NOTE — Progress Notes (Signed)
Urine drug screen for this encounter is consistent for prescribed medication 

## 2015-07-13 ENCOUNTER — Encounter: Attending: Registered Nurse | Admitting: Registered Nurse

## 2015-07-13 ENCOUNTER — Encounter: Payer: Self-pay | Admitting: Registered Nurse

## 2015-07-13 ENCOUNTER — Encounter (HOSPITAL_COMMUNITY): Payer: Self-pay

## 2015-07-13 ENCOUNTER — Emergency Department (HOSPITAL_COMMUNITY)
Admission: EM | Admit: 2015-07-13 | Discharge: 2015-07-13 | Disposition: A | Discharge status: Home / Self Care | Payer: Federal, State, Local not specified - PPO | Attending: Emergency Medicine | Admitting: Emergency Medicine

## 2015-07-13 VITALS — BP 175/114 | HR 104 | Resp 14

## 2015-07-13 DIAGNOSIS — Z862 Personal history of diseases of the blood and blood-forming organs and certain disorders involving the immune mechanism: Secondary | ICD-10-CM | POA: Diagnosis not present

## 2015-07-13 DIAGNOSIS — G4700 Insomnia, unspecified: Secondary | ICD-10-CM | POA: Diagnosis not present

## 2015-07-13 DIAGNOSIS — I509 Heart failure, unspecified: Secondary | ICD-10-CM | POA: Insufficient documentation

## 2015-07-13 DIAGNOSIS — Z09 Encounter for follow-up examination after completed treatment for conditions other than malignant neoplasm: Secondary | ICD-10-CM | POA: Diagnosis not present

## 2015-07-13 DIAGNOSIS — Z9889 Other specified postprocedural states: Secondary | ICD-10-CM | POA: Insufficient documentation

## 2015-07-13 DIAGNOSIS — D649 Anemia, unspecified: Secondary | ICD-10-CM | POA: Insufficient documentation

## 2015-07-13 DIAGNOSIS — M79604 Pain in right leg: Secondary | ICD-10-CM | POA: Diagnosis not present

## 2015-07-13 DIAGNOSIS — M62838 Other muscle spasm: Secondary | ICD-10-CM

## 2015-07-13 DIAGNOSIS — I10 Essential (primary) hypertension: Secondary | ICD-10-CM | POA: Insufficient documentation

## 2015-07-13 DIAGNOSIS — Z791 Long term (current) use of non-steroidal anti-inflammatories (NSAID): Secondary | ICD-10-CM | POA: Diagnosis not present

## 2015-07-13 DIAGNOSIS — G894 Chronic pain syndrome: Secondary | ICD-10-CM

## 2015-07-13 DIAGNOSIS — M549 Dorsalgia, unspecified: Secondary | ICD-10-CM

## 2015-07-13 DIAGNOSIS — M961 Postlaminectomy syndrome, not elsewhere classified: Secondary | ICD-10-CM

## 2015-07-13 DIAGNOSIS — E669 Obesity, unspecified: Secondary | ICD-10-CM | POA: Insufficient documentation

## 2015-07-13 DIAGNOSIS — M545 Low back pain: Secondary | ICD-10-CM | POA: Diagnosis not present

## 2015-07-13 DIAGNOSIS — Z8719 Personal history of other diseases of the digestive system: Secondary | ICD-10-CM | POA: Insufficient documentation

## 2015-07-13 DIAGNOSIS — Z79899 Other long term (current) drug therapy: Secondary | ICD-10-CM | POA: Insufficient documentation

## 2015-07-13 DIAGNOSIS — M533 Sacrococcygeal disorders, not elsewhere classified: Secondary | ICD-10-CM

## 2015-07-13 DIAGNOSIS — F1010 Alcohol abuse, uncomplicated: Secondary | ICD-10-CM | POA: Insufficient documentation

## 2015-07-13 DIAGNOSIS — Z5181 Encounter for therapeutic drug level monitoring: Secondary | ICD-10-CM

## 2015-07-13 DIAGNOSIS — G8929 Other chronic pain: Secondary | ICD-10-CM | POA: Insufficient documentation

## 2015-07-13 DIAGNOSIS — K5900 Constipation, unspecified: Secondary | ICD-10-CM | POA: Insufficient documentation

## 2015-07-13 DIAGNOSIS — R202 Paresthesia of skin: Secondary | ICD-10-CM | POA: Insufficient documentation

## 2015-07-13 DIAGNOSIS — M5416 Radiculopathy, lumbar region: Secondary | ICD-10-CM

## 2015-07-13 DIAGNOSIS — Z91040 Latex allergy status: Secondary | ICD-10-CM | POA: Insufficient documentation

## 2015-07-13 DIAGNOSIS — I429 Cardiomyopathy, unspecified: Secondary | ICD-10-CM | POA: Insufficient documentation

## 2015-07-13 DIAGNOSIS — M5137 Other intervertebral disc degeneration, lumbosacral region: Secondary | ICD-10-CM | POA: Insufficient documentation

## 2015-07-13 MED ORDER — OXYMORPHONE HCL ER 20 MG PO TB12: 20.0000 mg | ORAL_TABLET | Freq: Two times a day (BID) | ORAL | Status: DC

## 2015-07-13 MED ORDER — ZOLPIDEM TARTRATE 10 MG PO TABS: 10.0000 mg | ORAL_TABLET | Freq: Every evening | ORAL | Status: DC | PRN

## 2015-07-13 MED ORDER — KETOROLAC TROMETHAMINE 30 MG/ML IJ SOLN
30.0000 mg | Freq: Once | INTRAMUSCULAR | Status: AC
  Administered 2015-07-13: 30 mg via INTRAMUSCULAR
  Filled 2015-07-13: qty 1

## 2015-07-13 MED ORDER — GABAPENTIN 100 MG PO CAPS: 100.0000 mg | ORAL_CAPSULE | Freq: Three times a day (TID) | ORAL | Status: DC

## 2015-07-13 NOTE — Progress Notes (Signed)
Subjective:    Patient ID: Molly Norton, female    DOB: Jan 12, 1966, 50 y.o.   MRN: [id number]  HPI:Molly Norton is a 50 year old female who returns  for follow up appointment and medication refill. She states her pain is located in her lower back radiating into her right hip and lower extremity posteriorly and laterally. She rates her pain 9. Also states her pain has intensified in the last two days,  Also states ' she's in excruciating pain, tearful with assessment, also radicualar pain noted in right lower extremity. Gabapentin will be resumed and instructions given she verbalizes understanding.Molly Norton asked for Toradol injection, she arrived to office hypertensive. She states she had her antihypertensive this morning. Blood pressure re-checked three times, last  blood pressure prior to leaving office 158/112. Toradol contraindicated with hypertension. She verbalizes understanding. She agrees to go to [facility] ED for evaluation  Her current exercise regime is attending physical therapy twice a week. Escorted to her car via wheelchair.  S/P Right Sacroiliac joint fusion on 05/17/2015 with Dr. Yevette Edwards.  She has a follow up appointment scheduled with Dr. Yevette Edwards on 07/16/2015.  Spent over 30 minutes counseling and answering questions  Pain Inventory Average Pain 7 Pain Right Now 9 My pain is sharp, burning, dull, stabbing, tingling and aching  In the last 24 hours, has pain interfered with the following? General activity 10 Relation with others 10 Enjoyment of life 10 What TIME of day is your pain at its worst? morning, evening, night Sleep (in general) Poor  Pain is worse with: walking, bending, sitting, inactivity, standing, unsure and some activites Pain improves with: rest, heat/ice, therapy/exercise, pacing activities, medication, TENS and injections Relief from Meds: 8  Mobility walk with assistance use a cane use a walker how many minutes can you  walk? 5 min ability to climb steps?  no do you drive?  yes use a wheelchair needs help with transfers Do you have any goals in this area?  yes  Function Do you have any goals in this area?  yes  Neuro/Psych bladder control problems numbness tremor tingling trouble walking spasms  Prior Studies Any changes since last visit?  no x-rays  Physicians involved in your care Any changes since last visit?  yes   Family History  Problem Relation Age of Onset  . Hypertension Mother   . Heart disease Father    Social History   Social History  . Marital Status: Married    Spouse Name: N/A  . Number of Children: N/A  . Years of Education: N/A   Social History Main Topics  . Smoking status: Never Smoker   . Smokeless tobacco: Never Used  . Alcohol Use: Yes     Comment: rare  . Drug Use: No  . Sexual Activity: Not Asked   Other Topics Concern  . None   Social History Narrative   Separated, lives alone, marital stress in past.    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 Nasal sinus surgery    . Cesarean section  1990  . Abdominal hysterectomy    . Laparoscopic ovarian cystectomy      post cyst rupture  . Anterior lumbar fusion  12/19/2010    L4-5 and L5-S1  . Breast lumpectomy    . Left and right heart catheterization with coronary angiogram N/A 06/27/2014    Procedure: LEFT AND RIGHT HEART  CATHETERIZATION WITH CORONARY ANGIOGRAM;  Surgeon: Corky Crafts, MD;  Location: [facility] CATH LAB;  Service: Cardiovascular;  Laterality: N/A;  . Wisdom tooth extraction    . Colonoscopy    . Sacroiliac joint fusion Right 05/17/2015    Procedure: SACROILIAC JOINT FUSION;  Surgeon: Estill Bamberg, MD;  Location: [facility] OR;  Service: Orthopedics;  Laterality: Right;  Right sided sacroiliac joint fusion   Past Medical History  Diagnosis Date  . DDD (degenerative disc disease)     at L4-5 and L5-S1  . Chronic lower  back pain   . Cardiomyopathy (HCC) 06/23/2014  . Essential hypertension 06/23/2014  . Obesity (BMI 30-39.9)   . PONV (postoperative nausea and vomiting)   . Family history of adverse reaction to anesthesia     my daughter " came out in a rage."  . CHF (congestive heart failure) (HCC)   . Anemia   . Constipation    BP 188/107 mmHg  Pulse 104  Resp 14  SpO2 97%  Opioid Risk Score:   Fall Risk Score:  `1  Depression screen PHQ 2/9  Depression screen [facility] 2/9 01/23/2015 01/04/2015 12/26/2014 09/04/2014  Decreased Interest 0  Down, Depressed, Hopeless 0  PHQ - 2 Score 0  Altered sleeping - Tired, decreased energy - Change in appetite - 0 0 0  Feeling bad or failure about yourself  - 0 0 0  Trouble concentrating - 0 0 0  Moving slowly or fidgety/restless - 0 0 0  Suicidal thoughts - 0 0 0  PHQ-9 Score - Difficult doing work/chores - - Not difficult at all -     Review of Systems  Constitutional:       Bladder control problems  Cardiovascular:       Limb swelling   Gastrointestinal: Positive for vomiting and constipation.  Musculoskeletal: Positive for gait problem.  Neurological: Positive for tremors and numbness.       Tingling  Spasms   All other systems reviewed and are negative.      Objective:   Physical Exam  Constitutional: She is oriented to person, place, and time. She appears well-developed and well-nourished.  HENT:  Head: Normocephalic and atraumatic.  Neck: Normal range of motion. Neck supple.  Cardiovascular: Normal rate and regular rhythm.   Pulmonary/Chest: Effort normal and breath sounds normal.  Musculoskeletal:  Normal Muscle Bulk and Muscle Testing Reveals: Upper Extremities: Full ROM and Muscle Strength 5/5 Lumbar Hypersensitivity Right Greater Trochanteric Tenderness Lower Extremities: Left: ROM and Muscle Strength 5/5 Right: Decreased ROM and Muscle Strength 3/5 and Right Lower Extremity Flexion  Produces Pain into Lumbar Transfer to wheelchair Using [place] Crutches  Neurological: She is alert and oriented to person, place, and time.  Skin: Skin is warm and dry.  Psychiatric: She has a normal mood and affect.  Nursing note and vitals reviewed.         Assessment & Plan:  1. Acute Exacerbation on Chronic Low Back Pain:  [facility] ED for Evaluation 2. 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 2. Right sacroiliac disorder: S/P Sacroiliac Joint Fusion with Dr. Yevette Edwards 3. Insomnia: RX: Increased:Ambien 10 mg one tablet at HS prn  4. Muscle Spasm:  Continue Zanaflex. May increase to QID 5. Lumbar Radicular Pain: RX: Gabapentin 6. Uncontrolled  HTN: [facility] ED for Evaluation  60 minutes of face to face patient care time was spent during this visit. All questions were encouraged and answered.

## 2015-07-13 NOTE — ED Notes (Signed)
Patient requested ice pack. Ice pack given for lower back.

## 2015-07-13 NOTE — ED Notes (Signed)
[facility] went to [facility] today.  Pt referred to [facility] d/t elevated bp.  Pt takes bp meds. And has had her dose today.  Pt with chronic back and leg pain.  Pain in leg is increased.

## 2015-07-13 NOTE — Patient Instructions (Signed)
Start Gabapentin Tonight:  Gabapentin 100 mg one capsule three times a day. One Morning, One Afternoon and One in the Evening.  If no relief on 07/15/2015  Increase Gabapentin to Two Capsules in the Morning , Afternoon, Evening and Two at Bedtime  Call Office next week Thursday or Friday on 2/9 or 2/10 to evaluate .  [phone]

## 2015-07-13 NOTE — ED Provider Notes (Signed)
CSN: [id number]     Arrival date & time 07/13/15  1238 History   First MD Initiated Contact with Patient 07/13/15 1258     Chief Complaint  Patient presents with  . Leg Pain  . Hypertension     (Consider location/radiation/quality/duration/timing/severity/associated sxs/prior Treatment) HPI Comments: 50 year old female with past medical history including hypertension, chronic low back and leg pain who presents with hypertension. The patient went to her pain clinic today for follow-up of an acute exacerbation of her chronic back and leg pain. She was noted to be hypertensive at 170s systolic there and was sent here for evaluation. The patient reports that her chronic pain has been severe recently and resistant to her home Opana medication. Regarding the hypertension, she states that it is normally not this high as she is compliant with her medications and checks her blood pressure daily. She suspects that it is related to her severe pain. She denies any associated chest pain or shortness of breath. No fevers or recent illness.  Patient is a 50 y.o. female presenting with leg pain and hypertension. The history is provided by the patient.  Leg Pain Hypertension    Past Medical History  Diagnosis Date  . DDD (degenerative disc disease)     at L4-5 and L5-S1  . Chronic lower back pain   . Cardiomyopathy (HCC) 06/23/2014  . Essential hypertension 06/23/2014  . Obesity (BMI 30-39.9)   . PONV (postoperative nausea and vomiting)   . Family history of adverse reaction to anesthesia     my daughter " came out in a rage."  . CHF (congestive heart failure) (HCC)   . Anemia   . Constipation    Past Surgical History  Procedure Laterality Date  . Nasal sinus surgery    . Cesarean section  1990  . Abdominal hysterectomy    . Laparoscopic ovarian cystectomy      post cyst rupture  . Anterior lumbar fusion  12/19/2010    L4-5 and L5-S1  . Breast lumpectomy    . Left and right heart  catheterization with coronary angiogram N/A 06/27/2014    Procedure: LEFT AND RIGHT HEART CATHETERIZATION WITH CORONARY ANGIOGRAM;  Surgeon: Corky Crafts, MD;  Location: [facility] CATH LAB;  Service: Cardiovascular;  Laterality: N/A;  . Wisdom tooth extraction    . Colonoscopy    . Sacroiliac joint fusion Right 05/17/2015    Procedure: SACROILIAC JOINT FUSION;  Surgeon: Estill Bamberg, MD;  Location: [facility] OR;  Service: Orthopedics;  Laterality: Right;  Right sided sacroiliac joint fusion   Family History  Problem Relation Age of Onset  . Hypertension Mother   . Heart disease Father    Social History  Substance Use Topics  . Smoking status: Never Smoker   . Smokeless tobacco: Never Used  . Alcohol Use: Yes     Comment: rare   OB History    No data available     Review of Systems 10 Systems reviewed and are negative for acute change except as noted in the HPI.    Allergies  Adhesive; Latex; Lisinopril; Relafen; Valium; Codeine; Other; and Robaxin  Home Medications   Prior to Admission medications   Medication Sig Start Date End Date Taking? Authorizing Provider  baclofen (LIORESAL) 20 MG tablet Take 20 mg by mouth 4 (four) times daily.   Yes Historical Provider, MD  [facility] SENNA 8.6 MG tablet TAKE 2 TABLETS (17.2 MG TOTAL) BY MOUTH DAILY AS NEEDED. Patient taking differently: TAKE 17.2  MG TOTAL BY MOUTH DAILY. 07/24/14  Yes Erick Colace, MD  cyclobenzaprine (FLEXERIL) 10 MG tablet Take 1 tablet (10 mg total) by mouth at bedtime. 06/15/15  Yes Erick Colace, MD  diclofenac (FLECTOR) 1.3 % PTCH PLACE 1 PATCH ONTO THE SKIN 2 (TWO) TIMES DAILY. 06/15/15  Yes Erick Colace, MD  furosemide (LASIX) 40 MG tablet Take 40 mg by mouth daily.    Yes Historical Provider, MD  Heat Wraps [facility] BACK/HIP) MISC APPLY 1 PATCH EVERY DAY 09/28/14  Yes Erick Colace, MD  hydrALAZINE (APRESOLINE) 50 MG tablet Take 50 mg by mouth 2 (two) times daily.    Yes Historical Provider, MD   hydrochlorothiazide (HYDRODIURIL) 25 MG tablet Take 25 mg by mouth daily.   Yes Historical Provider, MD  metoprolol succinate (TOPROL-XL) 25 MG 24 hr tablet Take 25 mg by mouth daily.   Yes Historical Provider, MD  nitroGLYCERIN (NITROSTAT) 0.4 MG SL tablet Place 1 tablet (0.4 mg total) under the tongue every 5 (five) minutes as needed for chest pain. 11/20/14  Yes Mirian Mo, MD  oxymorphone (OPANA ER) 20 MG 12 hr tablet Take 1 tablet (20 mg total) by mouth every 12 (twelve) hours. 07/13/15  Yes Jones Bales, NP  polyethylene glycol powder (GLYCOLAX/MIRALAX) powder TAKE 17 G BY MOUTH 2 (TWO) TIMES DAILY. Patient taking differently: TAKE 17 G BY MOUTH DAILY EVERY OTHER DAY 07/24/14  Yes Erick Colace, MD  spironolactone (ALDACTONE) 25 MG tablet Take 25 mg by mouth daily.   Yes Historical Provider, MD  tiZANidine (ZANAFLEX) 4 MG capsule TAKE 4 MG BY MOUTH THREE TIMES A DAY Patient taking differently: Take 4 mg by mouth 3 (three) times daily.  05/17/15  Yes Kayla J McKenzie, PA-C  zolpidem (AMBIEN) 10 MG tablet Take 1 tablet (10 mg total) by mouth at bedtime as needed for sleep. 07/13/15 08/12/15 Yes Jones Bales, NP  gabapentin (NEURONTIN) 100 MG capsule Take 1 capsule (100 mg total) by mouth 3 (three) times daily. Patient not taking: Reported on 07/13/2015 07/13/15   Jones Bales, NP   BP 177/112 mmHg  Pulse 80  Temp(Src) 97.8 F (36.6 C) (Oral)  Resp 19  SpO2 96% Physical Exam  Constitutional: She is oriented to person, place, and time. She appears well-developed and well-nourished. No distress.  Uncomfortable, bouncing R leg  HENT:  Head: Normocephalic and atraumatic.  Moist mucous membranes  Eyes: Conjunctivae are normal. Pupils are equal, round, and reactive to light.  Neck: Neck supple.  Cardiovascular: Normal rate, regular rhythm and normal heart sounds.   No murmur heard. Pulmonary/Chest: Effort normal and breath sounds normal.  Abdominal: Soft. Bowel sounds are normal.  She exhibits no distension. There is no tenderness.  Musculoskeletal: She exhibits no edema.  Neurological: She is alert and oriented to person, place, and time.  Fluent speech  Skin: Skin is warm and dry.  Psychiatric: Judgment normal.  distressed  Nursing note and vitals reviewed.   ED Course  Procedures (including critical care time) Labs Review Labs Reviewed - No data to display    EKG Interpretation   Date/Time:  Friday July 13 2015 13:14:42 EST Ventricular Rate:  97 PR Interval:  185 QRS Duration: 98 QT Interval:  352 QTC Calculation: 447 R Axis:   48 Text Interpretation:  Sinus tachycardia Multiple ventricular premature  complexes Probable left atrial enlargement Minimal ST depression, inferior  leads Baseline wander in lead(s) V6 NO SIGNIFICANT CHANGE SINCE LAST  TRACING YESTERDAY Confirmed by LITTLE MD, RACHEL [phone]) on 07/13/2015  1:20:22 PM     Medications  ketorolac (TORADOL) 30 MG/ML injection 30 mg (30 mg Intramuscular Given 07/13/15 1334)    MDM   Final diagnoses:  Essential hypertension  Exacerbation of chronic back pain   Patient presents from her pain clinic for further evaluation of hypertension incidentally noted at the clinic today. On exam, she was uncomfortable but in no acute distress. Vital signs notable for hypertension at 188/107. Patient denying any chest pain or shortness of breath. EKG shows no changes from previous. Patient requested Toradol for her back pain which I gave. I suspect that her hypertension may be related to pain but as she is not complaining of headache, chest pain, or any other symptoms to suggest hypertensive emergency, I do not feel she needs any further workup or treatment at this time. I have discussed the importance of follow-up with PCP to reevaluate and discuss medication changes as needed. The patient understands the importance of medication compliance. I reviewed return precautions including the onset of chest pain,  headache, or any other new symptoms. Patient voiced understanding and was discharged in satisfactory condition.  Laurence Spates, MD 07/13/15 1430

## 2015-08-10 ENCOUNTER — Encounter: Admitting: Registered Nurse

## 2015-08-13 ENCOUNTER — Encounter: Attending: Registered Nurse | Admitting: Registered Nurse

## 2015-08-13 ENCOUNTER — Encounter: Payer: Self-pay | Admitting: Registered Nurse

## 2015-08-13 VITALS — BP 158/105 | HR 55 | Resp 14

## 2015-08-13 DIAGNOSIS — M62838 Other muscle spasm: Secondary | ICD-10-CM

## 2015-08-13 DIAGNOSIS — M961 Postlaminectomy syndrome, not elsewhere classified: Secondary | ICD-10-CM

## 2015-08-13 DIAGNOSIS — D649 Anemia, unspecified: Secondary | ICD-10-CM | POA: Insufficient documentation

## 2015-08-13 DIAGNOSIS — Z09 Encounter for follow-up examination after completed treatment for conditions other than malignant neoplasm: Secondary | ICD-10-CM | POA: Diagnosis not present

## 2015-08-13 DIAGNOSIS — M533 Sacrococcygeal disorders, not elsewhere classified: Secondary | ICD-10-CM

## 2015-08-13 DIAGNOSIS — I10 Essential (primary) hypertension: Secondary | ICD-10-CM | POA: Diagnosis not present

## 2015-08-13 DIAGNOSIS — M5137 Other intervertebral disc degeneration, lumbosacral region: Secondary | ICD-10-CM | POA: Insufficient documentation

## 2015-08-13 DIAGNOSIS — G8929 Other chronic pain: Secondary | ICD-10-CM | POA: Diagnosis present

## 2015-08-13 DIAGNOSIS — K5900 Constipation, unspecified: Secondary | ICD-10-CM | POA: Diagnosis not present

## 2015-08-13 DIAGNOSIS — F1010 Alcohol abuse, uncomplicated: Secondary | ICD-10-CM | POA: Diagnosis not present

## 2015-08-13 DIAGNOSIS — I429 Cardiomyopathy, unspecified: Secondary | ICD-10-CM | POA: Insufficient documentation

## 2015-08-13 DIAGNOSIS — G4700 Insomnia, unspecified: Secondary | ICD-10-CM | POA: Insufficient documentation

## 2015-08-13 DIAGNOSIS — I509 Heart failure, unspecified: Secondary | ICD-10-CM | POA: Insufficient documentation

## 2015-08-13 DIAGNOSIS — Z5181 Encounter for therapeutic drug level monitoring: Secondary | ICD-10-CM

## 2015-08-13 DIAGNOSIS — M545 Low back pain: Secondary | ICD-10-CM | POA: Diagnosis present

## 2015-08-13 DIAGNOSIS — Z79899 Other long term (current) drug therapy: Secondary | ICD-10-CM

## 2015-08-13 DIAGNOSIS — R202 Paresthesia of skin: Secondary | ICD-10-CM | POA: Insufficient documentation

## 2015-08-13 DIAGNOSIS — E669 Obesity, unspecified: Secondary | ICD-10-CM | POA: Insufficient documentation

## 2015-08-13 DIAGNOSIS — G894 Chronic pain syndrome: Secondary | ICD-10-CM | POA: Diagnosis not present

## 2015-08-13 MED ORDER — CYCLOBENZAPRINE HCL 10 MG PO TABS: 10.0000 mg | ORAL_TABLET | Freq: Every day | ORAL | Status: DC

## 2015-08-13 MED ORDER — TIZANIDINE HCL 4 MG PO CAPS: 4.0000 mg | ORAL_CAPSULE | Freq: Three times a day (TID) | ORAL | Status: DC | PRN

## 2015-08-13 MED ORDER — OXYMORPHONE HCL ER 20 MG PO TB12: 20.0000 mg | ORAL_TABLET | Freq: Two times a day (BID) | ORAL | Status: DC

## 2015-08-13 MED ORDER — GABAPENTIN 100 MG PO CAPS: ORAL_CAPSULE | ORAL | Status: DC

## 2015-08-13 NOTE — Progress Notes (Signed)
Subjective:    Patient ID: Molly Norton, female    DOB: Mar 10, 1966, 50 y.o.   MRN: [id number]  HPI: Molly Norton is a 50 year old female who returnsfor follow up appointment and medication refill. She states her pain is located in her lower back radiating into her right hip and right lower extremity laterally. She rates her pain 4. Her current exercise regime is attending physical therapy twice a week and walking in her home with her walker. Arrived to office hypertensive blood pressure rechecked right arm 104/63 and left arm 109/70 pulse 102.  S/P Right Sacroiliac joint fusion on 05/17/2015 with Dr. Yevette Edwardsumonski.  She has a follow up appointment  with Dr. Yevette Edwardsumonski today 07/16/2015.  Pain Inventory Average Pain 6 Pain Right Now 4 My pain is sharp, burning, dull, stabbing, tingling and aching  In the last 24 hours, has pain interfered with the following? General activity 4 Relation with others 4 Enjoyment of life 4 What TIME of day is your pain at its worst? all Sleep (in general) Poor  Pain is worse with: walking, bending, sitting, inactivity, standing and some activites Pain improves with: rest, heat/ice, therapy/exercise, pacing activities, medication and injections Relief from Meds: 8  Mobility walk with assistance use a cane use a walker ability to climb steps?  no do you drive?  yes use a wheelchair needs help with transfers Do you have any goals in this area?  yes  Function Do you have any goals in this area?  yes  Neuro/Psych bladder control problems numbness tingling trouble walking spasms  Prior Studies Any changes since last visit?  no  Physicians involved in your care Any changes since last visit?  no   Family History  Problem Relation Age of Onset  . Hypertension Mother   . Heart disease Father    Social History   Social History  . Marital Status: Married    Spouse Name: N/A  . Number of Children: N/A  . Years of Education:  N/A   Social History Main Topics  . Smoking status: Never Smoker   . Smokeless tobacco: Never Used  . Alcohol Use: Yes     Comment: rare  . Drug Use: No  . Sexual Activity: Not Asked   Other Topics Concern  . None   Social History Narrative   Separated, lives alone, marital stress in past.    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 Nasal sinus surgery    . Cesarean section  1990  . Abdominal hysterectomy    . Laparoscopic ovarian cystectomy      post cyst rupture  . Anterior lumbar fusion  12/19/2010    L4-5 and L5-S1  . Breast lumpectomy    . Left and right heart catheterization with coronary angiogram N/A 06/27/2014    Procedure: LEFT AND RIGHT HEART CATHETERIZATION WITH CORONARY ANGIOGRAM;  Surgeon: Corky CraftsJayadeep S Varanasi, MD;  [facility];  Service: Cardiovascular;  Laterality: N/A;  . Wisdom tooth extraction    . Colonoscopy    . Sacroiliac joint fusion Right 05/17/2015    Procedure: SACROILIAC JOINT FUSION;  Surgeon: Estill BambergMark Dumonski, MD;  [facility];  Service: Orthopedics;  Laterality: Right;  Right sided sacroiliac joint fusion   Past Medical History  Diagnosis Date  . DDD (degenerative disc disease)     at L4-5 and L5-S1  . Chronic lower back pain   .  Cardiomyopathy (HCC) 06/23/2014  . Essential hypertension 06/23/2014  . Obesity (BMI 30-39.9)   . PONV (postoperative nausea and vomiting)   . Family history of adverse reaction to anesthesia     my daughter " came out in a rage."  . CHF (congestive heart failure) (HCC)   . Anemia   . Constipation    BP 158/105 mmHg  Pulse 55  Resp 14  SpO2 98%  Opioid Risk Score:   Fall Risk Score:  `1  Depression screen PHQ 2/9  Depression screen [facility] 2/9 01/23/2015 01/04/2015 12/26/2014 09/04/2014  Decreased Interest 0  Down, Depressed, Hopeless 0  PHQ - 2 Score 0  Altered sleeping - Tired, decreased energy - Change in appetite - 0 0 0  Feeling bad or failure about yourself  - 0 0 0  Trouble concentrating - 0 0 0  Moving slowly or fidgety/restless - 0 0 0  Suicidal thoughts - 0 0 0  PHQ-9 Score - Difficult doing work/chores - - Not difficult at all -     Review of Systems  Cardiovascular: Positive for leg swelling.  Gastrointestinal: Positive for constipation.  All other systems reviewed and are negative.      Objective:   Physical Exam  Constitutional: She is oriented to person, place, and time. She appears well-developed and well-nourished.  HENT:  Head: Normocephalic and atraumatic.  Neck: Normal range of motion. Neck supple.  Cardiovascular: Normal rate and regular rhythm.   Pulmonary/Chest: Effort normal and breath sounds normal.  Musculoskeletal:  Normal Muscle Bulk and Muscle Testing Reveals: Upper Extremities: Full ROM and Muscle Strength 5/5 Back without spinal or paraspinal tenderness Lower Extremities: Right: Decreased ROM / Extension and Flexion Produces Muscle Spasms Muscle Strength 4/5 Left: Full ROM and Muscle Strength 5/5 Arises from chair slowly/ using one lofstrand crutch for support Antalgic gait  Neurological: She is alert and oriented to person, place, and time.  Skin: Skin is warm and dry.  Psychiatric: She has a normal mood and affect.  Nursing note and vitals reviewed.         Assessment & Plan:  1. 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 2. Right sacroiliac disorder: S/P Sacroiliac Joint Fusion with Dr. Yevette Edwards 3. Insomnia: Continue:Ambien 10 mg one tablet at HS prn  4. Muscle Spasm: Continue Zanaflex/ Flexeril.  5. Lumbar Radicular Pain: Continue: Gabapentin  20 minutes of face to face patient care time was spent during this visit. All questions were encouraged and answered.

## 2015-09-11 ENCOUNTER — Other Ambulatory Visit: Payer: Self-pay | Admitting: Physical Medicine & Rehabilitation

## 2015-09-20 ENCOUNTER — Encounter: Payer: Self-pay | Admitting: Registered Nurse

## 2015-09-20 ENCOUNTER — Encounter: Attending: Registered Nurse | Admitting: Registered Nurse

## 2015-09-20 VITALS — BP 154/81 | HR 60 | Resp 14

## 2015-09-20 DIAGNOSIS — I509 Heart failure, unspecified: Secondary | ICD-10-CM | POA: Insufficient documentation

## 2015-09-20 DIAGNOSIS — G4700 Insomnia, unspecified: Secondary | ICD-10-CM | POA: Insufficient documentation

## 2015-09-20 DIAGNOSIS — M533 Sacrococcygeal disorders, not elsewhere classified: Secondary | ICD-10-CM | POA: Diagnosis not present

## 2015-09-20 DIAGNOSIS — G8929 Other chronic pain: Secondary | ICD-10-CM | POA: Diagnosis present

## 2015-09-20 DIAGNOSIS — D649 Anemia, unspecified: Secondary | ICD-10-CM | POA: Insufficient documentation

## 2015-09-20 DIAGNOSIS — M545 Low back pain: Secondary | ICD-10-CM | POA: Insufficient documentation

## 2015-09-20 DIAGNOSIS — M5137 Other intervertebral disc degeneration, lumbosacral region: Secondary | ICD-10-CM | POA: Diagnosis not present

## 2015-09-20 DIAGNOSIS — I10 Essential (primary) hypertension: Secondary | ICD-10-CM | POA: Insufficient documentation

## 2015-09-20 DIAGNOSIS — E669 Obesity, unspecified: Secondary | ICD-10-CM | POA: Insufficient documentation

## 2015-09-20 DIAGNOSIS — F1010 Alcohol abuse, uncomplicated: Secondary | ICD-10-CM | POA: Insufficient documentation

## 2015-09-20 DIAGNOSIS — Z79899 Other long term (current) drug therapy: Secondary | ICD-10-CM

## 2015-09-20 DIAGNOSIS — R202 Paresthesia of skin: Secondary | ICD-10-CM | POA: Insufficient documentation

## 2015-09-20 DIAGNOSIS — Z09 Encounter for follow-up examination after completed treatment for conditions other than malignant neoplasm: Secondary | ICD-10-CM | POA: Diagnosis present

## 2015-09-20 DIAGNOSIS — M961 Postlaminectomy syndrome, not elsewhere classified: Secondary | ICD-10-CM

## 2015-09-20 DIAGNOSIS — M62838 Other muscle spasm: Secondary | ICD-10-CM

## 2015-09-20 DIAGNOSIS — G894 Chronic pain syndrome: Secondary | ICD-10-CM | POA: Diagnosis not present

## 2015-09-20 DIAGNOSIS — K5900 Constipation, unspecified: Secondary | ICD-10-CM | POA: Diagnosis not present

## 2015-09-20 DIAGNOSIS — I429 Cardiomyopathy, unspecified: Secondary | ICD-10-CM | POA: Diagnosis not present

## 2015-09-20 DIAGNOSIS — Z5181 Encounter for therapeutic drug level monitoring: Secondary | ICD-10-CM

## 2015-09-20 MED ORDER — OXYMORPHONE HCL ER 20 MG PO TB12: 20.0000 mg | ORAL_TABLET | Freq: Two times a day (BID) | ORAL | Status: DC

## 2015-09-20 MED ORDER — ZOLPIDEM TARTRATE 10 MG PO TABS: 10.0000 mg | ORAL_TABLET | Freq: Every evening | ORAL | Status: DC | PRN

## 2015-09-20 MED ORDER — CYCLOBENZAPRINE HCL 10 MG PO TABS: 10.0000 mg | ORAL_TABLET | Freq: Every day | ORAL | Status: DC

## 2015-09-20 NOTE — Progress Notes (Signed)
Subjective:    Patient ID: Molly Norton, female    DOB: 07/28/1965, 50 y.o.   MRN: [id number]  HPI: [name] is a 50 year old female who returnsfor follow up appointment and medication refill. She states her pain is located in her lower back radiating into her right hip and right lower extremity laterally. She rates her pain 5. Her current exercise regime is walking in her home with her crutch. Also states on 09/04/2015 she was walking in her kitchen and her right knee buckled, she landed on her right knee. She was able to get herself up, she didn't seek medical attention.  S/P Right Sacroiliac joint fusion on 05/17/2015 with Dr. Yevette Edwards.   Pain Inventory Average Pain 6 Pain Right Now 5 My pain is sharp, burning, dull, stabbing, tingling and aching  In the last 24 hours, has pain interfered with the following? General activity 8 Relation with others 8 Enjoyment of life 8 What TIME of day is your pain at its worst? all Sleep (in general) Poor  Pain is worse with: walking, bending, sitting, inactivity, standing and some activites Pain improves with: rest, heat/ice, therapy/exercise, medication and injections Relief from Meds: 8  Mobility walk with assistance use a cane use a walker ability to climb steps?  no do you drive?  yes use a wheelchair needs help with transfers Do you have any goals in this area?  yes  Function not employed: date last employed . Do you have any goals in this area?  yes  Neuro/Psych bladder control problems numbness tingling trouble walking spasms  Prior Studies Any changes since last visit?  no  Physicians involved in your care Any changes since last visit?  no   Family History  Problem Relation Age of Onset  . Hypertension Mother   . Heart disease Father    Social History   Social History  . Marital Status: Married    Spouse Name: N/A  . Number of Children: N/A  . Years of Education: N/A   Social  History Main Topics  . Smoking status: Never Smoker   . Smokeless tobacco: Never Used  . Alcohol Use: Yes     Comment: rare  . Drug Use: No  . Sexual Activity: Not Asked   Other Topics Concern  . None   Social History Narrative   Separated, lives alone, marital stress in past.    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 Nasal sinus surgery    . Cesarean section  1990  . Abdominal hysterectomy    . Laparoscopic ovarian cystectomy      post cyst rupture  . Anterior lumbar fusion  12/19/2010    L4-5 and L5-S1  . Breast lumpectomy    . Left and right heart catheterization with coronary angiogram N/A 06/27/2014    Procedure: LEFT AND RIGHT HEART CATHETERIZATION WITH CORONARY ANGIOGRAM;  Surgeon: Corky Crafts, MD;  Location: [facility] CATH LAB;  Service: Cardiovascular;  Laterality: N/A;  . Wisdom tooth extraction    . Colonoscopy    . Sacroiliac joint fusion Right 05/17/2015    Procedure: SACROILIAC JOINT FUSION;  Surgeon: Estill Bamberg, MD;  Location: [facility] OR;  Service: Orthopedics;  Laterality: Right;  Right sided sacroiliac joint fusion   Past Medical History  Diagnosis Date  . DDD (degenerative disc disease)     at L4-5 and L5-S1  . Chronic lower back  pain   . Cardiomyopathy (HCC) 06/23/2014  . Essential hypertension 06/23/2014  . Obesity (BMI 30-39.9)   . PONV (postoperative nausea and vomiting)   . Family history of adverse reaction to anesthesia     my daughter " came out in a rage."  . CHF (congestive heart failure) (HCC)   . Anemia   . Constipation    BP 154/81 mmHg  Pulse 60  Resp 14  SpO2 98%  Opioid Risk Score:   Fall Risk Score:  `1  Depression screen PHQ 2/9  Depression [facility] 2/9 01/23/2015 01/04/2015 12/26/2014 09/04/2014  Decreased Interest 2 3 3  0  Down, Depressed, Hopeless 2 3 3  0  PHQ - 2 Score 4 6 6  0  Altered sleeping - 3 2 2   Tired, decreased energy - 1 1 2   Change in appetite  - 0 0 0  Feeling bad or failure about yourself  - 0 0 0  Trouble concentrating - 0 0 0  Moving slowly or fidgety/restless - 0 0 0  Suicidal thoughts - 0 0 0  PHQ-9 Score - 10 9 4   Difficult doing work/chores - - Not difficult at all -     Review of Systems  Gastrointestinal: Positive for constipation.  All other systems reviewed and are negative.      Objective:   Physical Exam  Constitutional: She is oriented to person, place, and time. She appears well-developed and well-nourished.  HENT:  Head: Normocephalic and atraumatic.  Neck: Normal range of motion. Neck supple.  Cardiovascular: Normal rate and regular rhythm.   Pulmonary/Chest: Effort normal and breath sounds normal.  Musculoskeletal:  Normal Muscle Bulk and Muscle Testing Reveals: Upper Extremities: Full ROM and Muscle Strength 5/5 Lumbar Paraspinal Tenderness: L-3- L-5 Mainly Right Side Right Greater Trochanteric Tenderness Lower Extremities: Full ROM and Muscle Strength 5/5 Arises from chair slowly, take her awhile to get her bearings Antalgic Gait  Neurological: She is alert and oriented to person, place, and time.  Skin: Skin is warm and dry.  Psychiatric: She has a normal mood and affect.  Nursing note and vitals reviewed.         Assessment & Plan:  1. 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 We will continue the opioid monitoring program, this consists of regular clinic visits, examinations, urine drug screen, pill counts as well as use of [place] Controlled Substance Reporting [place]. 2. Right sacroiliac disorder: S/P Sacroiliac Joint Fusion with Dr. Yevette Edwardsumonski 3. Insomnia: Continue:Ambien 10 mg one tablet at HS prn  4. Muscle Spasm: Continue Zanaflex/ Flexeril.  5. Lumbar Radicular Pain: Continue: Gabapentin and Therma Care  20 minutes of face to face patient care time was spent during this visit. All  questions were encouraged and answered.

## 2015-11-01 ENCOUNTER — Encounter: Admitting: Registered Nurse

## 2015-11-06 ENCOUNTER — Encounter: Payer: Self-pay | Admitting: Registered Nurse

## 2015-11-06 ENCOUNTER — Encounter: Attending: Registered Nurse | Admitting: Registered Nurse

## 2015-11-06 ENCOUNTER — Other Ambulatory Visit: Payer: Self-pay | Admitting: Physical Medicine & Rehabilitation

## 2015-11-06 VITALS — BP 155/83 | HR 104 | Resp 16

## 2015-11-06 DIAGNOSIS — R29898 Other symptoms and signs involving the musculoskeletal system: Secondary | ICD-10-CM

## 2015-11-06 DIAGNOSIS — M545 Low back pain: Secondary | ICD-10-CM | POA: Diagnosis present

## 2015-11-06 DIAGNOSIS — Z79899 Other long term (current) drug therapy: Secondary | ICD-10-CM

## 2015-11-06 DIAGNOSIS — K5900 Constipation, unspecified: Secondary | ICD-10-CM | POA: Insufficient documentation

## 2015-11-06 DIAGNOSIS — M5137 Other intervertebral disc degeneration, lumbosacral region: Secondary | ICD-10-CM | POA: Diagnosis not present

## 2015-11-06 DIAGNOSIS — G8929 Other chronic pain: Secondary | ICD-10-CM | POA: Insufficient documentation

## 2015-11-06 DIAGNOSIS — I10 Essential (primary) hypertension: Secondary | ICD-10-CM | POA: Diagnosis not present

## 2015-11-06 DIAGNOSIS — E669 Obesity, unspecified: Secondary | ICD-10-CM | POA: Diagnosis not present

## 2015-11-06 DIAGNOSIS — D649 Anemia, unspecified: Secondary | ICD-10-CM | POA: Diagnosis not present

## 2015-11-06 DIAGNOSIS — M533 Sacrococcygeal disorders, not elsewhere classified: Secondary | ICD-10-CM | POA: Diagnosis not present

## 2015-11-06 DIAGNOSIS — G4700 Insomnia, unspecified: Secondary | ICD-10-CM | POA: Insufficient documentation

## 2015-11-06 DIAGNOSIS — G894 Chronic pain syndrome: Secondary | ICD-10-CM

## 2015-11-06 DIAGNOSIS — I509 Heart failure, unspecified: Secondary | ICD-10-CM | POA: Diagnosis not present

## 2015-11-06 DIAGNOSIS — F1010 Alcohol abuse, uncomplicated: Secondary | ICD-10-CM | POA: Diagnosis not present

## 2015-11-06 DIAGNOSIS — R202 Paresthesia of skin: Secondary | ICD-10-CM | POA: Insufficient documentation

## 2015-11-06 DIAGNOSIS — M961 Postlaminectomy syndrome, not elsewhere classified: Secondary | ICD-10-CM | POA: Diagnosis not present

## 2015-11-06 DIAGNOSIS — Z09 Encounter for follow-up examination after completed treatment for conditions other than malignant neoplasm: Secondary | ICD-10-CM | POA: Diagnosis present

## 2015-11-06 DIAGNOSIS — M62838 Other muscle spasm: Secondary | ICD-10-CM

## 2015-11-06 DIAGNOSIS — Z5181 Encounter for therapeutic drug level monitoring: Secondary | ICD-10-CM

## 2015-11-06 DIAGNOSIS — R296 Repeated falls: Secondary | ICD-10-CM

## 2015-11-06 DIAGNOSIS — I429 Cardiomyopathy, unspecified: Secondary | ICD-10-CM | POA: Diagnosis not present

## 2015-11-06 MED ORDER — DICLOFENAC EPOLAMINE 1.3 % TD PTCH: MEDICATED_PATCH | TRANSDERMAL | Status: DC

## 2015-11-06 MED ORDER — TIZANIDINE HCL 4 MG PO CAPS: 4.0000 mg | ORAL_CAPSULE | Freq: Three times a day (TID) | ORAL | Status: DC | PRN

## 2015-11-06 MED ORDER — OXYMORPHONE HCL ER 20 MG PO TB12: 20.0000 mg | ORAL_TABLET | Freq: Two times a day (BID) | ORAL | Status: DC

## 2015-11-06 MED ORDER — CYCLOBENZAPRINE HCL 10 MG PO TABS: 10.0000 mg | ORAL_TABLET | Freq: Every day | ORAL | Status: DC

## 2015-11-06 NOTE — Progress Notes (Signed)
Subjective:    Patient ID: Molly Norton, female    DOB: 1965-08-25, 50 y.o.   MRN: [id number]  HPI: [name] is a 50 year old female who returnsfor follow up appointment and medication refill. She states her pain is located in her lower back radiating into her right hip and right lower extremity laterally. She rates her pain 5. Her current exercise regime is walking in her home with her crutch or walker. Also states  She has noticed increased right lower extremity weakness, states Dr. Yevette Edwards is aware. She will be going to physical therapy at [facility] she has her evaluation on 11/15/15. She also states she has noticed decrease control with her foot as it relates to flexion and extension, muscle strength 4/5. We discussed driving safety and she verbalizes understanding. She will be calling her car dealership to inquire on hand controls. She verbalizes understanding.  Also states she has had two falls this month where she lost her balance and landed on her left side, she was able to pick herself up.She was able to pick herself up, she didn't seek medical attention.  S/P Right Sacroiliac joint fusion on 05/17/2015 with Dr. Yevette Edwards.   Pain Inventory Average Pain 4 Pain Right Now 4 My pain is sharp, burning, dull, stabbing, tingling and aching  In the last 24 hours, has pain interfered with the following? General activity 4 Relation with others 3 Enjoyment of life 3 What TIME of day is your pain at its worst? evening Sleep (in general) Poor  Pain is worse with: walking, sitting, inactivity, standing and some activites Pain improves with: rest, heat/ice, therapy/exercise, pacing activities, medication and injections Relief from Meds: 7  Mobility walk with assistance use a cane use a walker how many minutes can you walk? 4-5 ability to climb steps?  yes do you drive?  yes use a wheelchair needs help with transfers Do you have any goals in  this area?  yes  Function Do you have any goals in this area?  yes  Neuro/Psych bladder control problems numbness tingling trouble walking spasms  Prior Studies Any changes since last visit?  no  Physicians involved in your care Any changes since last visit?  no   Family History  Problem Relation Age of Onset  . Hypertension Mother   . Heart disease Father    Social History   Social History  . Marital Status: Married    Spouse Name: N/A  . Number of Children: N/A  . Years of Education: N/A   Social History Main Topics  . Smoking status: Never Smoker   . Smokeless tobacco: Never Used  . Alcohol Use: Yes     Comment: rare  . Drug Use: No  . Sexual Activity: Not Asked   Other Topics Concern  . None   Social History Narrative   Separated, lives alone, marital stress in past.    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 Nasal sinus surgery    . Cesarean section  1990  . Abdominal hysterectomy    . Laparoscopic ovarian cystectomy      post cyst rupture  . Anterior lumbar fusion  12/19/2010    L4-5 and L5-S1  . Breast lumpectomy    . Left and right heart catheterization with coronary angiogram N/A 06/27/2014    Procedure: LEFT AND RIGHT HEART CATHETERIZATION WITH CORONARY ANGIOGRAM;  Surgeon: Donnie Coffin  Eldridge Dace, MD;  Location: [facility] CATH LAB;  Service: Cardiovascular;  Laterality: N/A;  . Wisdom tooth extraction    . Colonoscopy    . Sacroiliac joint fusion Right 05/17/2015    Procedure: SACROILIAC JOINT FUSION;  Surgeon: Estill Bamberg, MD;  [facility] OR;  Service: Orthopedics;  Laterality: Right;  Right sided sacroiliac joint fusion   Past Medical History  Diagnosis Date  . DDD (degenerative disc disease)     at L4-5 and L5-S1  . Chronic lower back pain   . Cardiomyopathy (HCC) 06/23/2014  . Essential hypertension 06/23/2014  . Obesity (BMI 30-39.9)   . PONV (postoperative nausea and  vomiting)   . Family history of adverse reaction to anesthesia     my daughter " came out in a rage."  . CHF (congestive heart failure) (HCC)   . Anemia   . Constipation    BP 155/83 mmHg  Pulse 121  Resp 16  SpO2 99%  Opioid Risk Score:   Fall Risk Score:  `1  Depression screen PHQ 2/9  Depression screen [facility] 2/9 11/06/2015 01/23/2015 01/04/2015 12/26/2014 09/04/2014  Decreased Interest 0 0  Down, Depressed, Hopeless 0 0  PHQ - 2 Score 0 0  Altered sleeping - - Tired, decreased energy - - Change in appetite - - 0 0 0  Feeling bad or failure about yourself  - - 0 0 0  Trouble concentrating - - 0 0 0  Moving slowly or fidgety/restless - - 0 0 0  Suicidal thoughts - - 0 0 0  PHQ-9 Score - - Difficult doing work/chores - - - Not difficult at all -      Review of Systems  Constitutional: Positive for unexpected weight change.  Cardiovascular:       Limb Swelling   Gastrointestinal: Positive for constipation.  All other systems reviewed and are negative.      Objective:   Physical Exam  Constitutional: She is oriented to person, place, and time. She appears well-developed and well-nourished.  HENT:  Head: Normocephalic and atraumatic.  Neck: Normal range of motion. Neck supple.  Cardiovascular: Normal rate and regular rhythm.   Pulmonary/Chest: Effort normal and breath sounds normal.  Musculoskeletal:  Normal Muscle Bulk and Muscle Testing Reveals: Upper Extremities: Full ROM and Muscle Strength 5/5 Back without spinal tenderness Lower Extremities: Left: Full ROM and Muscle Strength 5/5 Right Lower Extremity Spasticity Noted and Muscle Strength 4/5 Arises from chair slowly using walker for support Narrow Based Gait  Neurological: She is alert and oriented to person, place, and time.  Skin: Skin is warm and dry.  Psychiatric: She has a normal mood and affect.  Nursing note and vitals reviewed.         Assessment & Plan:   1. 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 We will continue the opioid monitoring program, this consists of regular clinic visits, examinations, urine drug screen, pill counts as well as use of [place] Controlled Substance Reporting System. 2. Right sacroiliac disorder: S/P Sacroiliac Joint Fusion with Dr. Yevette Edwards 3. Insomnia: Continue:Ambien 10 mg one tablet at HS prn  4. Muscle Spasm: Continue Zanaflex/ Flexeril.  5. Lumbar Radicular Pain: Continue: Gabapentin and Therma Care 6. Frequent Falls: awaiting Physical Therapy Evaluation 7. Right Leg weakness: Dr. Yevette Edwards  following/ Awaiting Physical Therapy Evaluation  20 minutes of face to face patient care time was spent during this visit. All questions were encouraged and answered.

## 2015-12-04 ENCOUNTER — Encounter: Payer: Self-pay | Admitting: Registered Nurse

## 2015-12-04 ENCOUNTER — Encounter: Payer: Federal, State, Local not specified - PPO | Attending: Registered Nurse | Admitting: Registered Nurse

## 2015-12-04 VITALS — BP 151/101 | HR 67 | Resp 17

## 2015-12-04 DIAGNOSIS — G894 Chronic pain syndrome: Secondary | ICD-10-CM | POA: Diagnosis present

## 2015-12-04 DIAGNOSIS — M5416 Radiculopathy, lumbar region: Secondary | ICD-10-CM | POA: Insufficient documentation

## 2015-12-04 DIAGNOSIS — G4700 Insomnia, unspecified: Secondary | ICD-10-CM | POA: Diagnosis not present

## 2015-12-04 DIAGNOSIS — Z79899 Other long term (current) drug therapy: Secondary | ICD-10-CM | POA: Diagnosis not present

## 2015-12-04 DIAGNOSIS — Z955 Presence of coronary angioplasty implant and graft: Secondary | ICD-10-CM | POA: Diagnosis not present

## 2015-12-04 DIAGNOSIS — M961 Postlaminectomy syndrome, not elsewhere classified: Secondary | ICD-10-CM | POA: Diagnosis not present

## 2015-12-04 DIAGNOSIS — R296 Repeated falls: Secondary | ICD-10-CM | POA: Diagnosis not present

## 2015-12-04 DIAGNOSIS — M5136 Other intervertebral disc degeneration, lumbar region: Secondary | ICD-10-CM | POA: Insufficient documentation

## 2015-12-04 DIAGNOSIS — R251 Tremor, unspecified: Secondary | ICD-10-CM | POA: Insufficient documentation

## 2015-12-04 DIAGNOSIS — R200 Anesthesia of skin: Secondary | ICD-10-CM | POA: Diagnosis not present

## 2015-12-04 DIAGNOSIS — M25561 Pain in right knee: Secondary | ICD-10-CM | POA: Insufficient documentation

## 2015-12-04 DIAGNOSIS — M62838 Other muscle spasm: Secondary | ICD-10-CM | POA: Diagnosis not present

## 2015-12-04 DIAGNOSIS — I509 Heart failure, unspecified: Secondary | ICD-10-CM | POA: Insufficient documentation

## 2015-12-04 DIAGNOSIS — R531 Weakness: Secondary | ICD-10-CM | POA: Insufficient documentation

## 2015-12-04 DIAGNOSIS — Z981 Arthrodesis status: Secondary | ICD-10-CM | POA: Diagnosis not present

## 2015-12-04 DIAGNOSIS — Z8249 Family history of ischemic heart disease and other diseases of the circulatory system: Secondary | ICD-10-CM | POA: Diagnosis not present

## 2015-12-04 DIAGNOSIS — M533 Sacrococcygeal disorders, not elsewhere classified: Secondary | ICD-10-CM | POA: Diagnosis not present

## 2015-12-04 DIAGNOSIS — E669 Obesity, unspecified: Secondary | ICD-10-CM | POA: Diagnosis not present

## 2015-12-04 DIAGNOSIS — M5137 Other intervertebral disc degeneration, lumbosacral region: Secondary | ICD-10-CM | POA: Diagnosis not present

## 2015-12-04 DIAGNOSIS — I110 Hypertensive heart disease with heart failure: Secondary | ICD-10-CM | POA: Insufficient documentation

## 2015-12-04 DIAGNOSIS — I429 Cardiomyopathy, unspecified: Secondary | ICD-10-CM | POA: Insufficient documentation

## 2015-12-04 DIAGNOSIS — G8929 Other chronic pain: Secondary | ICD-10-CM | POA: Diagnosis not present

## 2015-12-04 DIAGNOSIS — Z90710 Acquired absence of both cervix and uterus: Secondary | ICD-10-CM | POA: Diagnosis not present

## 2015-12-04 DIAGNOSIS — D649 Anemia, unspecified: Secondary | ICD-10-CM | POA: Insufficient documentation

## 2015-12-04 DIAGNOSIS — Z5181 Encounter for therapeutic drug level monitoring: Secondary | ICD-10-CM | POA: Insufficient documentation

## 2015-12-04 DIAGNOSIS — R29898 Other symptoms and signs involving the musculoskeletal system: Secondary | ICD-10-CM | POA: Insufficient documentation

## 2015-12-04 MED ORDER — GABAPENTIN 100 MG PO CAPS: ORAL_CAPSULE | ORAL | Status: DC

## 2015-12-04 MED ORDER — BACLOFEN 20 MG PO TABS: 20.0000 mg | ORAL_TABLET | Freq: Four times a day (QID) | ORAL | Status: DC

## 2015-12-04 MED ORDER — ZOLPIDEM TARTRATE 10 MG PO TABS: 10.0000 mg | ORAL_TABLET | Freq: Every evening | ORAL | Status: DC | PRN

## 2015-12-04 MED ORDER — OXYMORPHONE HCL ER 20 MG PO TB12: 20.0000 mg | ORAL_TABLET | Freq: Two times a day (BID) | ORAL | Status: DC

## 2015-12-04 NOTE — Progress Notes (Signed)
Subjective:    Patient ID: Molly Norton, female    DOB: Mar 02, 1966, 50 y.o.   MRN: [id number]  HPI: [name] is a 50 year old female who returnsfor follow up appointment and medication refill. She states her pain is located in her lower back mainly right side radiating into her right hip and right lower extremity laterally ( from hip to the knee). She rates her pain 6. Her current exercise regime is attending physical therapy twice a week and walking  in her home with her  walker. Also states she was walking in her living room when her right leg gave out, she landed on her knees. She was able to pick herself up, she didn't seek medical attention. Arrived hypertensive states she has been compliant with her anti-hypertensives. Blood pressure re-checked, she refuses ED evaluation. Encouraged to keep a blood presure log and follow up with PCP.   Pain Inventory Average Pain 5 Pain Right Now 6 My pain is sharp, burning, dull, stabbing, tingling and aching  In the last 24 hours, has pain interfered with the following? General activity 5 Relation with others 5 Enjoyment of life 5 What TIME of day is your pain at its worst? evening, night Sleep (in general) Poor  Pain is worse with: walking, sitting, inactivity, standing and some activites Pain improves with: rest, heat/ice, therapy/exercise, pacing activities, medication and injections Relief from Meds: 8  Mobility walk with assistance use a cane use a walker how many minutes can you walk? 3-5 ability to climb steps?  yes do you drive?  yes use a wheelchair needs help with transfers Do you have any goals in this area?  yes  Function Do you have any goals in this area?  yes  Neuro/Psych bladder control problems weakness numbness tremor tingling  Prior Studies Any changes since last visit?  no  Physicians involved in your care Any changes since last visit?  no   Family History  Problem Relation  Age of Onset  . Hypertension Mother   . Heart disease Father    Social History   Social History  . Marital Status: Married    Spouse Name: N/A  . Number of Children: N/A  . Years of Education: N/A   Social History Main Topics  . Smoking status: Never Smoker   . Smokeless tobacco: Never Used  . Alcohol Use: Yes     Comment: rare  . Drug Use: No  . Sexual Activity: Not Asked   Other Topics Concern  . None   Social History Narrative   Separated, lives alone, marital stress in past.    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 Nasal sinus surgery    . Cesarean section  1990  . Abdominal hysterectomy    . Laparoscopic ovarian cystectomy      post cyst rupture  . Anterior lumbar fusion  12/19/2010    L4-5 and L5-S1  . Breast lumpectomy    . Left and right heart catheterization with coronary angiogram N/A 06/27/2014    Procedure: LEFT AND RIGHT HEART CATHETERIZATION WITH CORONARY ANGIOGRAM;  Surgeon: Corky Crafts, MD;  Location: [facility] CATH LAB;  Service: Cardiovascular;  Laterality: N/A;  . Wisdom tooth extraction    . Colonoscopy    . Sacroiliac joint fusion Right 05/17/2015    Procedure: SACROILIAC JOINT FUSION;  Surgeon: Estill Bamberg, MD;  Location: [facility] OR;  Service:  Orthopedics;  Laterality: Right;  Right sided sacroiliac joint fusion   Past Medical History  Diagnosis Date  . DDD (degenerative disc disease)     at L4-5 and L5-S1  . Chronic lower back pain   . Cardiomyopathy (HCC) 06/23/2014  . Essential hypertension 06/23/2014  . Obesity (BMI 30-39.9)   . PONV (postoperative nausea and vomiting)   . Family history of adverse reaction to anesthesia     my daughter " came out in a rage."  . CHF (congestive heart failure) (HCC)   . Anemia   . Constipation    BP 180/113 mmHg  Pulse 67  Resp 17  SpO2 97%  Opioid Risk Score:   Fall Risk Score:  `1  Depression screen PHQ 2/9  Depression [facility]  [facility] 11/06/2015 01/23/2015 01/04/2015 12/26/2014 09/04/2014  Decreased Interest 0 2 3 3  0  Down, Depressed, Hopeless 0 2 3 3  0  PHQ - 2 Score 0 4 6 6  0  Altered sleeping - - 3 2 2   Tired, decreased energy - - 1 1 2   Change in appetite - - 0 0 0  Feeling bad or failure about yourself  - - 0 0 0  Trouble concentrating - - 0 0 0  Moving slowly or fidgety/restless - - 0 0 0  Suicidal thoughts - - 0 0 0  PHQ-9 Score - - 10 9 4   Difficult doing work/chores - - - Not difficult at all -     Review of Systems  Constitutional: Positive for unexpected weight change.       Bladder control problems   Cardiovascular:       Limb swelling   Gastrointestinal: Positive for constipation.  Neurological: Positive for tremors, weakness and numbness.       Tingling   All other systems reviewed and are negative.      Objective:   Physical Exam  Constitutional: She is oriented to person, place, and time. She appears well-developed and well-nourished.  HENT:  Head: Normocephalic and atraumatic.  Neck: Normal range of motion. Neck supple.  Cardiovascular: Normal rate and regular rhythm.   Pulmonary/Chest: Effort normal and breath sounds normal.  Musculoskeletal:  Normal Muscle Bulk and Muscle Testing Reveals: Upper Extremities: Full ROM and Muscle Strength on the Left 5/5 and Right 3/5 Back without spinal tenderness Lower Extremities: Right: Decreased ROM and Muscle Strength 4/5. Right Lower Extremity Flexion and Extension Produces Muscle Spasms Arises from table slowly using walker for support Antalgic gait    Neurological: She is alert and oriented to person, place, and time.  Skin: Skin is warm and dry.  Psychiatric: She has a normal mood and affect.  Nursing note and vitals reviewed.         Assessment & Plan:  1. 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 We will continue the opioid monitoring  program, this consists of regular clinic visits, examinations, urine drug screen, pill counts as well as use of [place] Cont[place]. 2. Right sacroiliac disorder: S/P Sacroiliac Joint Fusion with Dr. Yevette Edwardsumonski 3. Insomnia: Continue:Ambien 10 mg one tablet at HS prn  4. Muscle Spasm: Continue Zanaflex/ Baclofen.  5. Lumbar Radicular Pain: Continue: Gabapentin and Therma Care 6. Frequent Falls: Continue Physical Therapy  7. Right Leg weakness: Dr. Yevette Edwardsumonski following  20 minutes of face to face patient care time was spent during this visit. All questions  were encouraged and answered.

## 2015-12-13 LAB — TOXASSURE SELECT,+ANTIDEPR,UR: PDF: 0

## 2015-12-27 NOTE — Progress Notes (Signed)
Urine drug screen for this encounter is consistent for prescribed medication 

## 2016-01-01 ENCOUNTER — Encounter: Payer: Self-pay | Admitting: Physical Medicine & Rehabilitation

## 2016-01-01 ENCOUNTER — Encounter: Attending: Registered Nurse

## 2016-01-01 ENCOUNTER — Ambulatory Visit (HOSPITAL_BASED_OUTPATIENT_CLINIC_OR_DEPARTMENT_OTHER): Admitting: Physical Medicine & Rehabilitation

## 2016-01-01 VITALS — BP 129/91 | HR 104 | Resp 16

## 2016-01-01 DIAGNOSIS — I429 Cardiomyopathy, unspecified: Secondary | ICD-10-CM | POA: Diagnosis not present

## 2016-01-01 DIAGNOSIS — M5137 Other intervertebral disc degeneration, lumbosacral region: Secondary | ICD-10-CM | POA: Diagnosis not present

## 2016-01-01 DIAGNOSIS — M7061 Trochanteric bursitis, right hip: Secondary | ICD-10-CM | POA: Diagnosis not present

## 2016-01-01 DIAGNOSIS — Z09 Encounter for follow-up examination after completed treatment for conditions other than malignant neoplasm: Secondary | ICD-10-CM | POA: Insufficient documentation

## 2016-01-01 DIAGNOSIS — F1010 Alcohol abuse, uncomplicated: Secondary | ICD-10-CM | POA: Diagnosis not present

## 2016-01-01 DIAGNOSIS — M545 Low back pain: Secondary | ICD-10-CM | POA: Diagnosis present

## 2016-01-01 DIAGNOSIS — D649 Anemia, unspecified: Secondary | ICD-10-CM | POA: Diagnosis not present

## 2016-01-01 DIAGNOSIS — G8929 Other chronic pain: Secondary | ICD-10-CM | POA: Insufficient documentation

## 2016-01-01 DIAGNOSIS — M961 Postlaminectomy syndrome, not elsewhere classified: Secondary | ICD-10-CM | POA: Diagnosis not present

## 2016-01-01 DIAGNOSIS — K5900 Constipation, unspecified: Secondary | ICD-10-CM | POA: Insufficient documentation

## 2016-01-01 DIAGNOSIS — I509 Heart failure, unspecified: Secondary | ICD-10-CM | POA: Diagnosis not present

## 2016-01-01 DIAGNOSIS — G4700 Insomnia, unspecified: Secondary | ICD-10-CM | POA: Insufficient documentation

## 2016-01-01 DIAGNOSIS — M533 Sacrococcygeal disorders, not elsewhere classified: Secondary | ICD-10-CM

## 2016-01-01 DIAGNOSIS — E669 Obesity, unspecified: Secondary | ICD-10-CM | POA: Insufficient documentation

## 2016-01-01 DIAGNOSIS — R202 Paresthesia of skin: Secondary | ICD-10-CM | POA: Insufficient documentation

## 2016-01-01 DIAGNOSIS — I10 Essential (primary) hypertension: Secondary | ICD-10-CM | POA: Insufficient documentation

## 2016-01-01 MED ORDER — OXYCODONE HCL ER 10 MG PO T12A: 30.0000 mg | EXTENDED_RELEASE_TABLET | Freq: Two times a day (BID) | ORAL | Status: DC

## 2016-01-01 MED ORDER — OXYCODONE HCL ER 30 MG PO T12A: 30.0000 mg | EXTENDED_RELEASE_TABLET | Freq: Two times a day (BID) | ORAL | 0 refills | Status: DC

## 2016-01-01 NOTE — Progress Notes (Signed)
Subjective:     Patient ID: Molly Norton, female   DOB: January 15, 1966, 50 y.o.   MRN: [id number]  HPI Right leg giving way more.  Seen by Ortho, repeat FCE placed further restrictions of no ambulation >50yds per day. Job changed due to union issues and restrictions.  Right Sacroiliac joint feels better overall, but still favoring RLE and feels weaker.  Still going to PT for this.  Discussed Opana is getting removed from market by FDA.  Still getting spasms in low back Trouble laying on Right side due to lateral hip pain No falls or new trauma.  Pain Inventory Average Pain 5 Pain Right Now 4 My pain is sharp, burning, dull, stabbing, tingling and aching  In the last 24 hours, has pain interfered with the following? General activity 4 Relation with others 4 Enjoyment of life 4 What TIME of day is your pain at its worst? evening, night Sleep (in general) Poor  Pain is worse with: walking, sitting, inactivity, standing and some activites Pain improves with: rest, heat/ice, therapy/exercise, pacing activities, medication and injections Relief from Meds: 8  Mobility walk with assistance use a cane use a walker how many minutes can you walk? 3-5 ability to climb steps?  no do you drive?  no use a wheelchair needs help with transfers Do you have any goals in this area?  yes  Function Do you have any goals in this area?  yes  Neuro/Psych bladder control problems weakness numbness tingling  Prior Studies Any changes since last visit?  no  Physicians involved in your care Any changes since last visit?  no   Family History  Problem Relation Age of Onset  . Hypertension Mother   . Heart disease Father    Social History   Social History  . Marital status: Married    Spouse name: N/A  . Number of children: N/A  . Years of education: N/A   Social History Main Topics  . Smoking status: Never Smoker  . Smokeless tobacco: Never Used  . Alcohol use Yes      Comment: rare  . Drug use: No  . Sexual activity: Not Asked   Other Topics Concern  . None   Social History Narrative   Separated, lives alone, marital stress in past.    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 ABDOMINAL HYSTERECTOMY    . ANTERIOR LUMBAR FUSION  12/19/2010   L4-5 and L5-S1  . BREAST LUMPECTOMY    . CESAREAN SECTION  1990  . COLONOSCOPY    . LAPAROSCOPIC OVARIAN CYSTECTOMY     post cyst rupture  . LEFT AND RIGHT HEART CATHETERIZATION WITH CORONARY ANGIOGRAM N/A 06/27/2014   Procedure: LEFT AND RIGHT HEART CATHETERIZATION WITH CORONARY ANGIOGRAM;  Surgeon: Corky Crafts, MD;  Location: [facility] CATH LAB;  Service: Cardiovascular;  Laterality: N/A;  . NASAL SINUS SURGERY    . SACROILIAC JOINT FUSION Right 05/17/2015   Procedure: SACROILIAC JOINT FUSION;  Surgeon: Estill Bamberg, MD;  Location: [facility] OR;  Service: Orthopedics;  Laterality: Right;  Right sided sacroiliac joint fusion  . WISDOM TOOTH EXTRACTION     Past Medical History:  Diagnosis Date  . Anemia   . Cardiomyopathy (HCC) 06/23/2014  . CHF (congestive heart failure) (HCC)   . Chronic lower back pain   . Constipation   . DDD (degenerative disc disease)    at L4-5 and L5-S1  .  Essential hypertension 06/23/2014  . Family history of adverse reaction to anesthesia    my daughter " came out in a rage."  . Obesity (BMI 30-39.9)   . PONV (postoperative nausea and vomiting)    There were no vitals taken for this visit.  Opioid Risk Score:   Fall Risk Score:  `1  Depression screen PHQ 2/9  Depression screen [facility] 2/9 11/06/2015 01/23/2015 01/04/2015 12/26/2014 09/04/2014  Decreased Interest 0 2 3 3  0  Down, Depressed, Hopeless 0 2 3 3  0  PHQ - 2 Score 0 4 6 6  0  Altered sleeping - - 3 2 2   Tired, decreased energy - - 1 1 2   Change in appetite - - 0 0 0  Feeling bad or failure about yourself  - - 0 0 0  Trouble concentrating - - 0 0 0   Moving slowly or fidgety/restless - - 0 0 0  Suicidal thoughts - - 0 0 0  PHQ-9 Score - - 10 9 4   Difficult doing work/chores - - - Not difficult at all -    Review of Systems  Constitutional: Positive for unexpected weight change.       Bladder control problems   Cardiovascular:       Limb swelling   Gastrointestinal: Positive for constipation.  Neurological: Positive for weakness and numbness.       Tingling   All other systems reviewed and are negative.      Objective:   Physical Exam  Constitutional: She is oriented to person, place, and time. She appears well-developed and well-nourished.  HENT:  Head: Normocephalic and atraumatic.  Eyes: Conjunctivae and EOM are normal. Pupils are equal, round, and reactive to light.  Neurological: She is alert and oriented to person, place, and time. A sensory deficit is present. Gait abnormal.  Reflex Scores:      Patellar reflexes are 1+ on the right side and 1+ on the left side.      Achilles reflexes are 2+ on the right side and 2+ on the left side. Patient has some numbness at the proximal lateral thigh on the right side. No sensory deficits in the L3, L4, L5, S1 dermatomal distribution. Strength is 4/5 in the right hip flexor, knee extensor and ankle dorsiflexor 5/5 in the left hip flexor, knee extensor, ankle dorsiflexor   Psychiatric: Her mood appears anxious. She exhibits a depressed mood.  Nursing note and vitals reviewed.      Assessment:     1.  Lumbar post laminectomy syndrome Will need to change from Opana ER to Oxycontin ER        2.  Right sacroiliac disorder-Follow-up with orthopedics Given fusion. No further injections  3.  Right troch bursitis- Troch bursa injection today Trochanteric bursa injection With or without ultrasound guidance  Indication Trochanteric bursitis. Exam has tenderness over the greater trochanter of the hip. Pain has not responded to conservative care such as exercise therapy and  oral medications. Pain interferes with sleep or with mobility Informed consent was obtained after describing risks and benefits of the procedure with the patient these include bleeding bruising and infection. Patient has signed written consent form. Patient placed in a lateral decubitus position with the affected hip superior. Point of maximal pain was palpated marked and prepped with Betadine and entered with a needle to bone contact. Needle slightly withdrawn then 6mg  of betamethasone with 4 cc 1% lidocaine were injected. Patient tolerated procedure well. Post procedure instructions given.  4.  Right meralgia parasthetica- neurontin 100mg  TID  5.  Lumbar spasms overall better, will hold off on Botox for now. Baclofen 20mg  QID Tizanidine 4mg  TID Flexeril 10 mg at hs    Plan:     1.  Start at 30mg  Oxycontin BID, may need to increase to 40mg  next month. If insufficient pain relief

## 2016-01-01 NOTE — Patient Instructions (Signed)
Trochanteric Bursitis You have hip pain due to trochanteric bursitis. Bursitis means that the sack near the outside of the hip is filled with fluid and inflamed. This sack is made up of protective soft tissue. The pain from trochanteric bursitis can be severe and keep you from sleep. It can radiate to the buttocks or down the outside of the thigh to the knee. The pain is almost always worse when rising from the seated or lying position and with walking. Pain can improve after you take a few steps. It happens more often in people with hip joint and lumbar spine problems, such as arthritis or previous surgery. Very rarely the trochanteric bursa can become infected, and antibiotics and/or surgery may be needed. Treatment often includes an injection of local anesthetic mixed with cortisone medicine. This medicine is injected into the area where it is most tender over the hip. Repeat injections may be necessary if the response to treatment is slow. You can apply ice packs over the tender area for 30 minutes every 2 hours for the next few days. Anti-inflammatory and/or narcotic pain medicine may also be helpful. Limit your activity for the next few days if the pain continues. See your caregiver in 5-10 days if you are not greatly improved.  SEEK IMMEDIATE MEDICAL CARE IF:  You develop severe pain, fever, or increased redness.  You have pain that radiates below the knee. EXERCISES STRETCHING EXERCISES - Trochanteric Bursitis  These exercises may help you when beginning to rehabilitate your injury. Your symptoms may resolve with or without further involvement from your physician, physical therapist, or athletic trainer. While completing these exercises, remember:   Restoring tissue flexibility helps normal motion to return to the joints. This allows healthier, less painful movement and activity.  An effective stretch should be held for at least 30 seconds.  A stretch should never be painful. You should only  feel a gentle lengthening or release in the stretched tissue. STRETCH - Iliotibial Band  On the floor or bed, lie on your side so your injured leg is on top. Bend your knee and grab your ankle.  Slowly bring your knee back so that your thigh is in line with your trunk. Keep your heel at your buttocks and gently arch your back so your head, shoulders and hips line up.  Slowly lower your leg so that your knee approaches the floor/bed until you feel a gentle stretch on the outside of your thigh. If you do not feel a stretch and your knee will not fall farther, place the heel of your opposite foot on top of your knee and pull your thigh down farther.  Hold this stretch for __________ seconds.  Repeat __________ times. Complete this exercise __________ times per day. STRETCH - Hamstrings, Supine   Lie on your back. Loop a belt or towel over the ball of your foot as shown.  Straighten your knee and slowly pull on the belt to raise your injured leg. Do not allow the knee to bend. Keep your opposite leg flat on the floor.  Raise the leg until you feel a gentle stretch behind your knee or thigh. Hold this position for __________ seconds.  Repeat __________ times. Complete this stretch __________ times per day. STRETCH - Quadriceps, Prone   Lie on your stomach on a firm surface, such as a bed or padded floor.  Bend your knee and grasp your ankle. If you are unable to reach your ankle or pant leg, use a belt   around your foot to lengthen your reach.  Gently pull your heel toward your buttocks. Your knee should not slide out to the side. You should feel a stretch in the front of your thigh and/or knee.  Hold this position for __________ seconds.  Repeat __________ times. Complete this stretch __________ times per day. STRETCHING - Hip Flexors, Lunge Half kneel with your knee on the floor and your opposite knee bent and directly over your ankle.  Keep good posture with your head over your  shoulders. Tighten your buttocks to point your tailbone downward; this will prevent your back from arching too much.  You should feel a gentle stretch in the front of your thigh and/or hip. If you do not feel any resistance, slightly slide your opposite foot forward and then slowly lunge forward so your knee once again lines up over your ankle. Be sure your tailbone remains pointed downward.  Hold this stretch for __________ seconds.  Repeat __________ times. Complete this stretch __________ times per day. STRETCH - Adductors, Lunge  While standing, spread your legs.  Lean away from your injured leg by bending your opposite knee. You may rest your hands on your thigh for balance.  You should feel a stretch in your inner thigh. Hold for __________ seconds.  Repeat __________ times. Complete this exercise __________ times per day.   This information is not intended to replace advice given to you by your health care provider. Make sure you discuss any questions you have with your health care provider.   Document Released: 07/03/2004 Document Revised: 10/10/2014 Document Reviewed: 09/07/2008 Elsevier Interactive Patient Education 2016 [employer]  

## 2016-01-02 ENCOUNTER — Other Ambulatory Visit: Payer: Self-pay | Admitting: *Deleted

## 2016-01-02 MED ORDER — THERMACARE BACK/HIP MISC: 2 refills | Status: DC

## 2016-01-18 IMAGING — CR DG CHEST 2V
2 series · 2 of 2 positions shown · non-contrast
Comparison: Portable chest x-ray dated December 19, 2010

CLINICAL DATA: Exertional shortness of breath and fatigued and
sweating ; substernal chest discomfort

EXAM:
CHEST  2 VIEW

[view not recorded (1 of 2)]
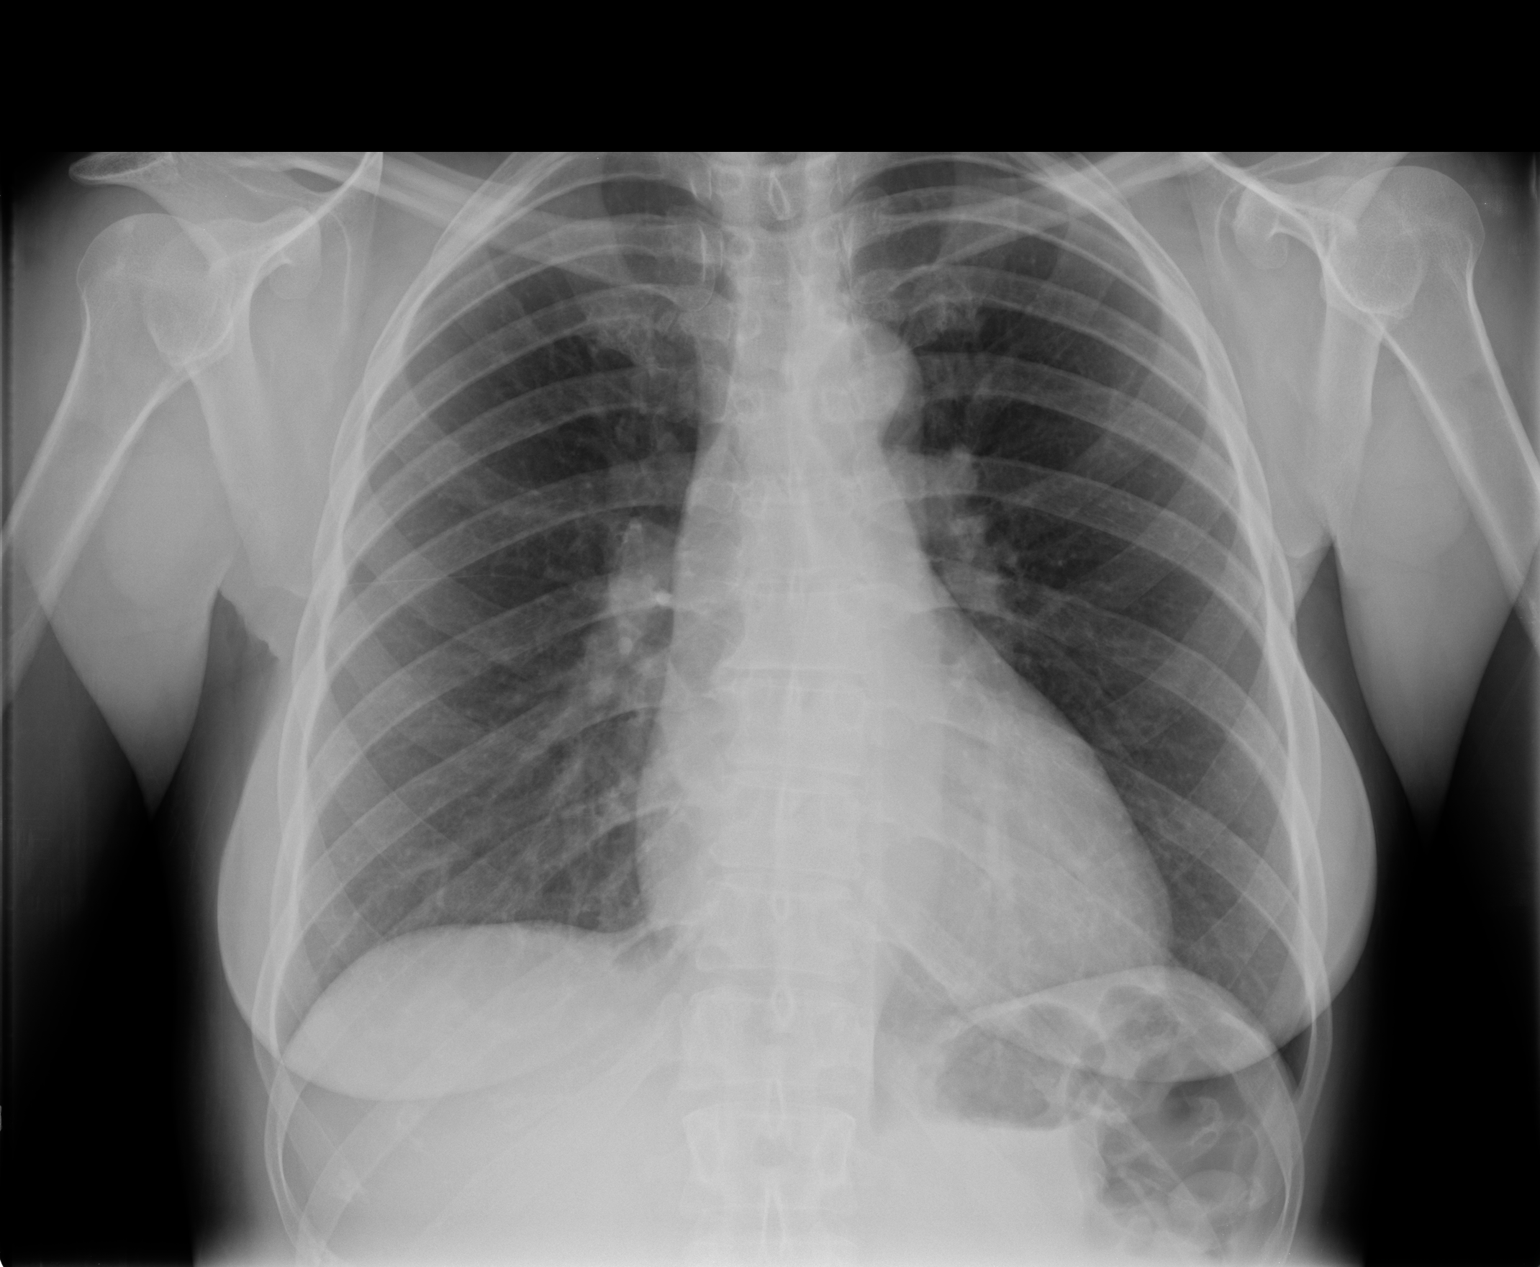

[view not recorded (2 of 2)]
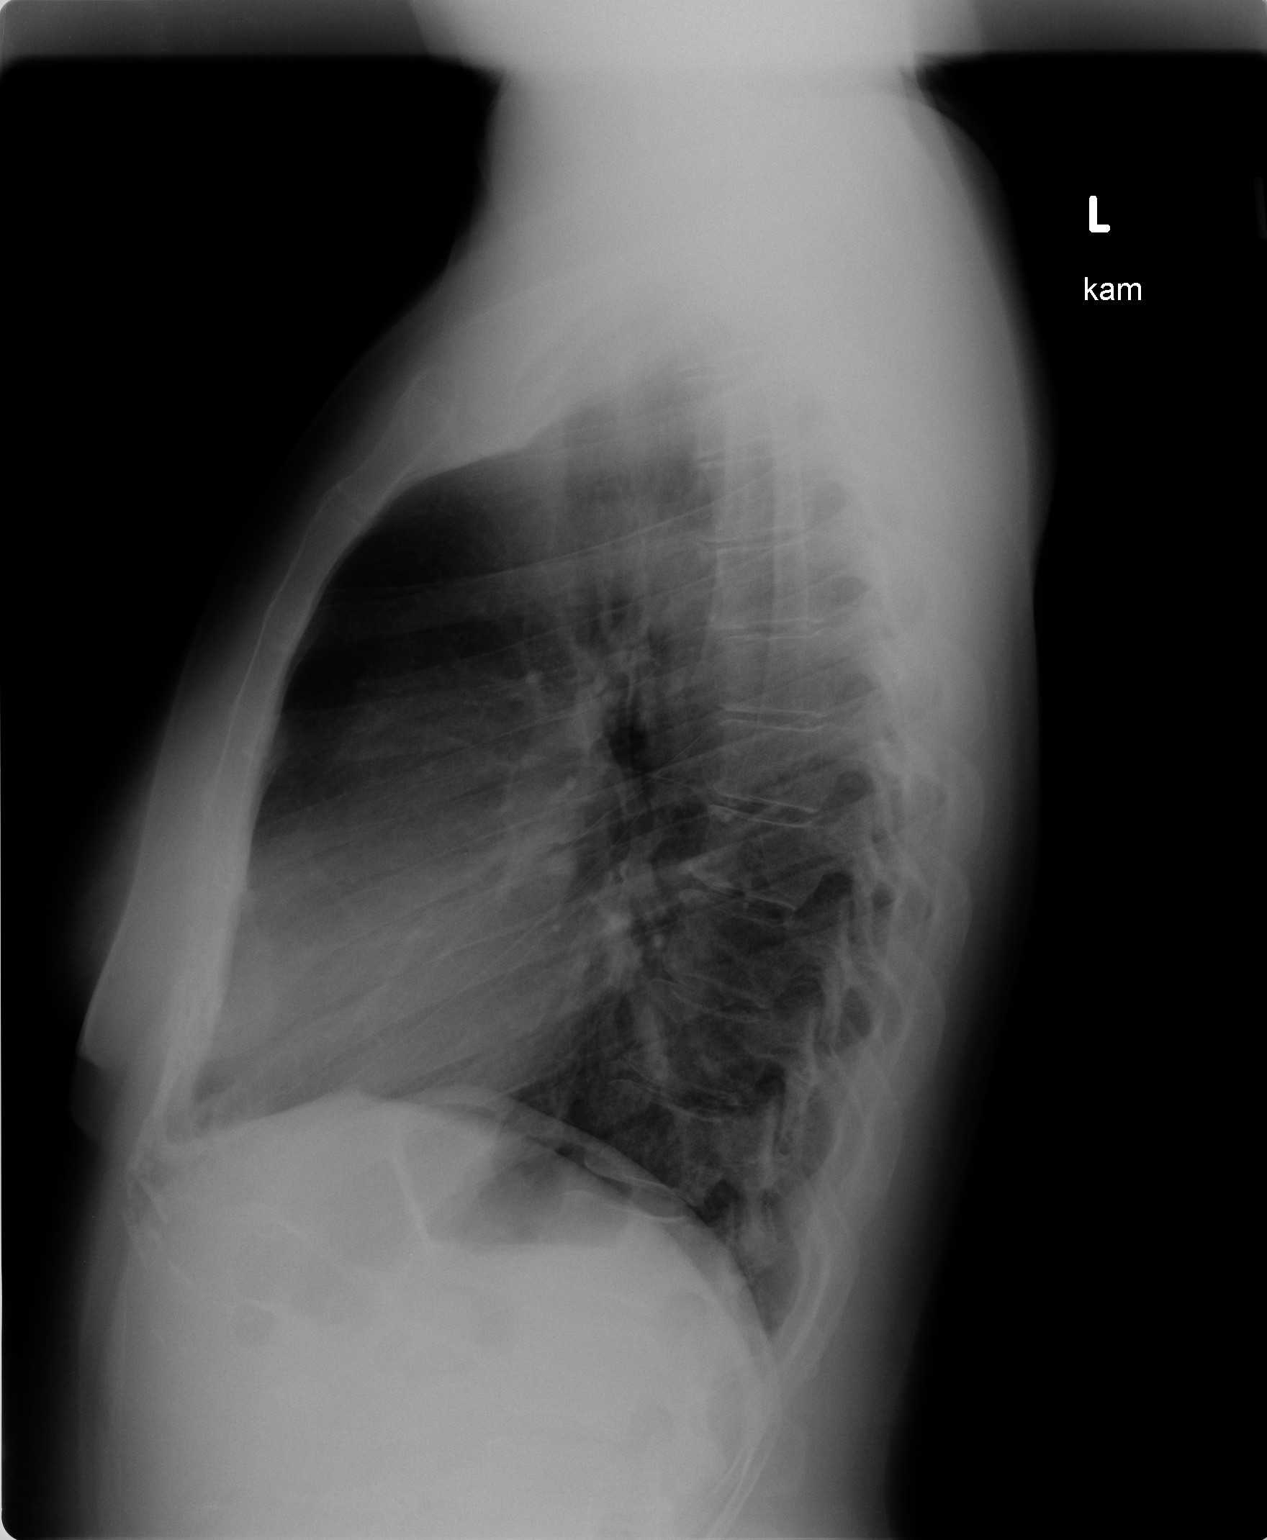

[2 of 2 positions shown; findings below may reference images not displayed]

FINDINGS: The lungs are adequately inflated. There is no focal infiltrate.
There is no pleural effusion or pneumothorax. Heart and pulmonary
vascularity are normal. There is mild tortuosity of the descending
thoracic aorta. The bony thorax is unremarkable.
IMPRESSION: There is no active cardiopulmonary disease.

## 2016-02-04 ENCOUNTER — Encounter: Payer: Self-pay | Admitting: Registered Nurse

## 2016-02-04 ENCOUNTER — Encounter: Attending: Registered Nurse | Admitting: Registered Nurse

## 2016-02-04 VITALS — BP 123/87 | HR 106

## 2016-02-04 DIAGNOSIS — D649 Anemia, unspecified: Secondary | ICD-10-CM | POA: Diagnosis not present

## 2016-02-04 DIAGNOSIS — Z5181 Encounter for therapeutic drug level monitoring: Secondary | ICD-10-CM | POA: Diagnosis not present

## 2016-02-04 DIAGNOSIS — G894 Chronic pain syndrome: Secondary | ICD-10-CM | POA: Diagnosis not present

## 2016-02-04 DIAGNOSIS — M545 Low back pain, unspecified: Secondary | ICD-10-CM

## 2016-02-04 DIAGNOSIS — M5416 Radiculopathy, lumbar region: Secondary | ICD-10-CM | POA: Diagnosis not present

## 2016-02-04 DIAGNOSIS — Z981 Arthrodesis status: Secondary | ICD-10-CM | POA: Insufficient documentation

## 2016-02-04 DIAGNOSIS — I110 Hypertensive heart disease with heart failure: Secondary | ICD-10-CM | POA: Diagnosis not present

## 2016-02-04 DIAGNOSIS — I509 Heart failure, unspecified: Secondary | ICD-10-CM | POA: Diagnosis not present

## 2016-02-04 DIAGNOSIS — R251 Tremor, unspecified: Secondary | ICD-10-CM | POA: Diagnosis not present

## 2016-02-04 DIAGNOSIS — Z955 Presence of coronary angioplasty implant and graft: Secondary | ICD-10-CM | POA: Diagnosis not present

## 2016-02-04 DIAGNOSIS — R200 Anesthesia of skin: Secondary | ICD-10-CM | POA: Insufficient documentation

## 2016-02-04 DIAGNOSIS — R531 Weakness: Secondary | ICD-10-CM | POA: Insufficient documentation

## 2016-02-04 DIAGNOSIS — M25561 Pain in right knee: Secondary | ICD-10-CM | POA: Insufficient documentation

## 2016-02-04 DIAGNOSIS — M961 Postlaminectomy syndrome, not elsewhere classified: Secondary | ICD-10-CM | POA: Insufficient documentation

## 2016-02-04 DIAGNOSIS — M62838 Other muscle spasm: Secondary | ICD-10-CM | POA: Insufficient documentation

## 2016-02-04 DIAGNOSIS — Z79899 Other long term (current) drug therapy: Secondary | ICD-10-CM | POA: Diagnosis not present

## 2016-02-04 DIAGNOSIS — E669 Obesity, unspecified: Secondary | ICD-10-CM | POA: Insufficient documentation

## 2016-02-04 DIAGNOSIS — R29898 Other symptoms and signs involving the musculoskeletal system: Secondary | ICD-10-CM | POA: Diagnosis not present

## 2016-02-04 DIAGNOSIS — I429 Cardiomyopathy, unspecified: Secondary | ICD-10-CM | POA: Diagnosis not present

## 2016-02-04 DIAGNOSIS — R296 Repeated falls: Secondary | ICD-10-CM | POA: Insufficient documentation

## 2016-02-04 DIAGNOSIS — Z90710 Acquired absence of both cervix and uterus: Secondary | ICD-10-CM | POA: Insufficient documentation

## 2016-02-04 DIAGNOSIS — M533 Sacrococcygeal disorders, not elsewhere classified: Secondary | ICD-10-CM | POA: Diagnosis not present

## 2016-02-04 DIAGNOSIS — M5136 Other intervertebral disc degeneration, lumbar region: Secondary | ICD-10-CM | POA: Insufficient documentation

## 2016-02-04 DIAGNOSIS — M5137 Other intervertebral disc degeneration, lumbosacral region: Secondary | ICD-10-CM | POA: Insufficient documentation

## 2016-02-04 DIAGNOSIS — G4700 Insomnia, unspecified: Secondary | ICD-10-CM | POA: Insufficient documentation

## 2016-02-04 DIAGNOSIS — G8929 Other chronic pain: Secondary | ICD-10-CM | POA: Insufficient documentation

## 2016-02-04 DIAGNOSIS — M7061 Trochanteric bursitis, right hip: Secondary | ICD-10-CM | POA: Diagnosis not present

## 2016-02-04 DIAGNOSIS — Z8249 Family history of ischemic heart disease and other diseases of the circulatory system: Secondary | ICD-10-CM | POA: Insufficient documentation

## 2016-02-04 MED ORDER — OXYCODONE HCL ER 40 MG PO T12A: 40.0000 mg | EXTENDED_RELEASE_TABLET | Freq: Two times a day (BID) | ORAL | 0 refills | Status: DC

## 2016-02-04 MED ORDER — ZOLPIDEM TARTRATE 10 MG PO TABS: 10.0000 mg | ORAL_TABLET | Freq: Every evening | ORAL | 2 refills | Status: DC | PRN

## 2016-02-04 MED ORDER — TIZANIDINE HCL 4 MG PO CAPS: 4.0000 mg | ORAL_CAPSULE | Freq: Three times a day (TID) | ORAL | 2 refills | Status: DC | PRN

## 2016-02-04 MED ORDER — KETOROLAC TROMETHAMINE 60 MG/2ML IM SOLN
60.0000 mg | Freq: Once | INTRAMUSCULAR | Status: AC
  Administered 2016-02-04 (×2): 60 mg via INTRAMUSCULAR

## 2016-02-04 MED ORDER — CYCLOBENZAPRINE HCL 10 MG PO TABS: 10.0000 mg | ORAL_TABLET | Freq: Every day | ORAL | 2 refills | Status: DC

## 2016-02-04 MED ORDER — DICLOFENAC EPOLAMINE 1.3 % TD PTCH: MEDICATED_PATCH | TRANSDERMAL | 2 refills | Status: DC

## 2016-02-04 NOTE — Patient Instructions (Signed)
Call Office Next Monday [phone] to evaluate medication changes

## 2016-02-04 NOTE — Progress Notes (Signed)
Subjective:    Patient ID: Molly Norton, female    DOB: 1966-01-31, 50 y.o.   MRN: [id number]  HPI: [name] is a 50 year old female who returnsfor follow up appointment and medication refill. She states her pain is located in her lower back mainly right side radiating into her right hip and right lower extremity laterally ( from hip to the knee). Also states she's having sacral pain. Muscle spasms noted in her right lower extremity. Also states since she was changed to Oxycontin from Opana her pain has increased in intensity.  Facial grimacing noted with assessment and crying, emotional support given Reviewed Dr. Wynn Banker note we will increase her Oxycontin to 40 mg. She realizes  Opana has been removed from the market, a call was placed to her pharmacy ([facility]) and the pharmacist explained to her their supply is  limited once they deplete their supply they will no longer have Opana.. She verbalizes understanding and Oxycontin 40 mg prescribed. She rates her pain 10. Her current exercise regime is walking  in her home with her  walker.  [name] has an ice pak burn located on her right buttock no blisters noted, hypersensitivity noted with discoloration. [name] states she usually uses a towel wrap , but she placed the ice pack directly on her skin due to pain, She was instructed not to use the ice pack without a towel, she verbalizes understanding.  Will continue to observe.   Pain Inventory Average Pain 10 Pain Right Now 10 My pain is sharp, burning, stabbing, tingling and aching  In the last 24 hours, has pain interfered with the following? General activity 10 Relation with others 10 Enjoyment of life 10 What TIME of day is your pain at its worst? morning, evening, night Sleep (in general) Poor  Pain is worse with: walking, bending, sitting, inactivity, standing and some activites Pain improves with: rest, heat/ice, therapy/exercise, pacing activities and  medication Relief from Meds: 1  Mobility walk with assistance use a cane use a walker how many minutes can you walk? 5 ability to climb steps?  no do you drive?  no use a wheelchair transfers alone Do you have any goals in this area?  yes  Function not employed: date last employed .  Neuro/Psych bladder control problems weakness numbness tremor tingling trouble walking spasms  Prior Studies Any changes since last visit?  no  Physicians involved in your care Any changes since last visit?  no   Family History  Problem Relation Age of Onset  . Hypertension Mother   . Heart disease Father    Social History   Social History  . Marital status: Married    Spouse name: N/A  . Number of children: N/A  . Years of education: N/A   Social History Main Topics  . Smoking status: Never Smoker  . Smokeless tobacco: Never Used  . Alcohol use Yes     Comment: rare  . Drug use: No  . Sexual activity: Not Asked   Other Topics Concern  . None   Social History Narrative   Separated, lives alone, marital stress in past.    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 ABDOMINAL HYSTERECTOMY    . ANTERIOR LUMBAR FUSION  12/19/2010   L4-5 and L5-S1  . BREAST LUMPECTOMY    . CESAREAN SECTION  1990  . COLONOSCOPY    .  LAPAROSCOPIC OVARIAN CYSTECTOMY     post cyst rupture  . LEFT AND RIGHT HEART CATHETERIZATION WITH CORONARY ANGIOGRAM N/A 06/27/2014   Procedure: LEFT AND RIGHT HEART CATHETERIZATION WITH CORONARY ANGIOGRAM;  Surgeon: Corky CraftsJayadeep S Varanasi, MD;  [facility];  Service: Cardiovascular;  Laterality: N/A;  . NASAL SINUS SURGERY    . SACROILIAC JOINT FUSION Right 05/17/2015   Procedure: SACROILIAC JOINT FUSION;  Surgeon: Estill BambergMark Dumonski, MD;  [facility];  Service: Orthopedics;  Laterality: Right;  Right sided sacroiliac joint fusion  . WISDOM TOOTH EXTRACTION     Past Medical History:    Diagnosis Date  . Anemia   . Cardiomyopathy (HCC) 06/23/2014  . CHF (congestive heart failure) (HCC)   . Chronic lower back pain   . Constipation   . DDD (degenerative disc disease)    at L4-5 and L5-S1  . Essential hypertension 06/23/2014  . Family history of adverse reaction to anesthesia    my daughter " came out in a rage."  . Obesity (BMI 30-39.9)   . PONV (postoperative nausea and vomiting)    BP 123/87   Pulse (!) 106   Opioid Risk Score:   Fall Risk Score:  `1  Depression screen PHQ 2/9  [facility] 2[facility] 11/06/2015 01/23/2015 01/04/2015 12/26/2014 09/04/2014  Decreased Interest 0 2 3 3  0  Down, Depressed, Hopeless 0 2 3 3  0  PHQ - 2 Score 0 4 6 6  0  Altered sleeping - - 3 2 2   Tired, decreased energy - - 1 1 2   Change in appetite - - 0 0 0  Feeling bad or failure about yourself  - - 0 0 0  Trouble concentrating - - 0 0 0  Moving slowly or fidgety/restless - - 0 0 0  Suicidal thoughts - - 0 0 0  PHQ-9 Score - - 10 9 4   Difficult doing work/chores - - - Not difficult at all -     Review of Systems  HENT: Negative.   Eyes: Negative.   Respiratory: Negative.   Cardiovascular: Negative.   Gastrointestinal: Positive for constipation and nausea.  Endocrine: Negative.   Genitourinary: Negative.   Musculoskeletal: Positive for back pain and gait problem.  Skin: Negative.   Allergic/Immunologic: Negative.   Neurological: Positive for weakness.  Hematological: Negative.   Psychiatric/Behavioral: Negative.        Objective:   Physical Exam  Constitutional: She is oriented to person, place, and time. She appears well-developed and well-nourished.  HENT:  Head: Normocephalic and atraumatic.  Neck: Normal range of motion. Neck supple.  Cardiovascular: Normal rate and regular rhythm.   Pulmonary/Chest: Effort normal and breath sounds normal.  Musculoskeletal:  Normal Muscle Bulk and Muscle Testing Reveals: Upper Extremities: Full ROM and Muscle Strength on  the right 4/5 and left 5/5 Lumbar Paraspinal Tenderness: L-3- L-5 Sacral Tenderness Noted Lower Extremities Left: Full ROM and Muscle Strength 5/5 Right: Decreased ROM and Muscle Strength 4/5 Right Lower Extremity Flexion Produces pain into Lumbar and Right Hip Arises from Table Slowly using walker for support Antalgic gait  Neurological: She is alert and oriented to person, place, and time.  Skin: Skin is warm and dry.  Psychiatric: She has a normal mood and affect.  Nursing note and vitals reviewed.         Assessment & Plan:  1. Acute Exacerbation of Chronic Back Pain: Tramadol Given. 2. 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 We will continue the opioid monitoring program, this consists of regular clinic visits, examinations, urine drug screen, pill counts as well as use of [place] Controlled Substance Reporting System. 3. Right sacroiliac disorder: S/P Sacroiliac Joint Fusion with Dr. Yevette Edwards 4. Insomnia: Continue:Ambien 10 mg one tablet at HS prn  5. Muscle Spasm: Continue Zanaflex/ Baclofen/ Flexeril.  6. Lumbar Radicular Pain: Continue: Gabapentin and Therma Care 7. Right Leg weakness: Dr. Yevette Edwards following  60 minutes of face to face patient care time was spent during this visit. All questions were encouraged and answered.

## 2016-03-06 ENCOUNTER — Encounter: Payer: Self-pay | Admitting: Registered Nurse

## 2016-03-06 ENCOUNTER — Encounter: Attending: Registered Nurse | Admitting: Registered Nurse

## 2016-03-06 VITALS — BP 154/97 | HR 103

## 2016-03-06 DIAGNOSIS — M5137 Other intervertebral disc degeneration, lumbosacral region: Secondary | ICD-10-CM | POA: Insufficient documentation

## 2016-03-06 DIAGNOSIS — R531 Weakness: Secondary | ICD-10-CM | POA: Diagnosis not present

## 2016-03-06 DIAGNOSIS — R200 Anesthesia of skin: Secondary | ICD-10-CM | POA: Insufficient documentation

## 2016-03-06 DIAGNOSIS — R296 Repeated falls: Secondary | ICD-10-CM | POA: Insufficient documentation

## 2016-03-06 DIAGNOSIS — I429 Cardiomyopathy, unspecified: Secondary | ICD-10-CM | POA: Insufficient documentation

## 2016-03-06 DIAGNOSIS — G4700 Insomnia, unspecified: Secondary | ICD-10-CM | POA: Diagnosis not present

## 2016-03-06 DIAGNOSIS — M62838 Other muscle spasm: Secondary | ICD-10-CM | POA: Diagnosis not present

## 2016-03-06 DIAGNOSIS — I110 Hypertensive heart disease with heart failure: Secondary | ICD-10-CM | POA: Diagnosis not present

## 2016-03-06 DIAGNOSIS — Z955 Presence of coronary angioplasty implant and graft: Secondary | ICD-10-CM | POA: Insufficient documentation

## 2016-03-06 DIAGNOSIS — Z79899 Other long term (current) drug therapy: Secondary | ICD-10-CM | POA: Diagnosis not present

## 2016-03-06 DIAGNOSIS — M5136 Other intervertebral disc degeneration, lumbar region: Secondary | ICD-10-CM | POA: Diagnosis not present

## 2016-03-06 DIAGNOSIS — M25561 Pain in right knee: Secondary | ICD-10-CM | POA: Insufficient documentation

## 2016-03-06 DIAGNOSIS — G8929 Other chronic pain: Secondary | ICD-10-CM | POA: Insufficient documentation

## 2016-03-06 DIAGNOSIS — Z8249 Family history of ischemic heart disease and other diseases of the circulatory system: Secondary | ICD-10-CM | POA: Insufficient documentation

## 2016-03-06 DIAGNOSIS — M5416 Radiculopathy, lumbar region: Secondary | ICD-10-CM | POA: Diagnosis not present

## 2016-03-06 DIAGNOSIS — M533 Sacrococcygeal disorders, not elsewhere classified: Secondary | ICD-10-CM | POA: Diagnosis not present

## 2016-03-06 DIAGNOSIS — R251 Tremor, unspecified: Secondary | ICD-10-CM | POA: Diagnosis not present

## 2016-03-06 DIAGNOSIS — Z981 Arthrodesis status: Secondary | ICD-10-CM | POA: Diagnosis not present

## 2016-03-06 DIAGNOSIS — D649 Anemia, unspecified: Secondary | ICD-10-CM | POA: Insufficient documentation

## 2016-03-06 DIAGNOSIS — I509 Heart failure, unspecified: Secondary | ICD-10-CM | POA: Insufficient documentation

## 2016-03-06 DIAGNOSIS — G894 Chronic pain syndrome: Secondary | ICD-10-CM

## 2016-03-06 DIAGNOSIS — E669 Obesity, unspecified: Secondary | ICD-10-CM | POA: Diagnosis not present

## 2016-03-06 DIAGNOSIS — M961 Postlaminectomy syndrome, not elsewhere classified: Secondary | ICD-10-CM

## 2016-03-06 DIAGNOSIS — Z5181 Encounter for therapeutic drug level monitoring: Secondary | ICD-10-CM | POA: Insufficient documentation

## 2016-03-06 DIAGNOSIS — R29898 Other symptoms and signs involving the musculoskeletal system: Secondary | ICD-10-CM | POA: Diagnosis not present

## 2016-03-06 DIAGNOSIS — Z90710 Acquired absence of both cervix and uterus: Secondary | ICD-10-CM | POA: Insufficient documentation

## 2016-03-06 MED ORDER — OXYCODONE HCL ER 40 MG PO T12A: 40.0000 mg | EXTENDED_RELEASE_TABLET | Freq: Two times a day (BID) | ORAL | 0 refills | Status: DC

## 2016-03-06 MED ORDER — OXYCODONE-ACETAMINOPHEN 5-325 MG PO TABS: 1.0000 | ORAL_TABLET | Freq: Two times a day (BID) | ORAL | 0 refills | Status: DC | PRN

## 2016-03-06 NOTE — Progress Notes (Signed)
Subjective:    Patient ID: Molly Norton, female    DOB: April 30, 1966, 50 y.o.   MRN: [id number]  HPI: Molly Norton is a 50 year old female who returnsfor follow up appointment and medication refill. She states her pain is located in her lower back mainly right side radiating into her right hip and right lower extremity laterally ( from hip to the knee) Also states she's not receiving adequate pain relief with  Oxycontin. Facial grimacing noted with assessment with tears noted with muscle spasticity. Will prescribed Percocet BID, discussed with Dr. Wynn BankerKirsteins  he agrees with plan. Molly Norton verbalizes understanding. She rates her pain 10. Her current exercise regime is attending physical therapy  twice a week and walking in her home with her walker.  Pain Inventory Average Pain 8 Pain Right Now 9 My pain is sharp, burning, stabbing, tingling and aching  In the last 24 hours, has pain interfered with the following? General activity 10 Relation with others 10 Enjoyment of life 10 What TIME of day is your pain at its worst? morning, evening, night Sleep (in general) Poor  Pain is worse with: walking, bending, sitting, inactivity, standing, unsure, some activites and can't weight barring on Rt Leg Pain improves with: rest, heat/ice, therapy/exercise, pacing activities and medication Relief from Meds: 3  Mobility walk with assistance use a cane use a walker how many minutes can you walk? 5-10 ability to climb steps?  no do you drive?  no use a wheelchair needs help with transfers Do you have any goals in this area?  yes  Function not employed: date last employed n/a Do you have any goals in this area?  no  Neuro/Psych bladder control problems weakness numbness tremor tingling trouble walking spasms  Prior Studies Any changes since last visit?  no  Physicians involved in your care Any changes since last visit?  no   Family History  Problem  Relation Age of Onset  . Hypertension Mother   . Heart disease Father    Social History   Social History  . Marital status: Married    Spouse name: N/A  . Number of children: N/A  . Years of education: N/A   Social History Main Topics  . Smoking status: Never Smoker  . Smokeless tobacco: Never Used  . Alcohol use Yes     Comment: rare  . Drug use: No  . Sexual activity: Not on file   Other Topics Concern  . Not on file   Social History Narrative   Separated, lives alone, marital stress in past.    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 ABDOMINAL HYSTERECTOMY    . ANTERIOR LUMBAR FUSION  12/19/2010   L4-5 and L5-S1  . BREAST LUMPECTOMY    . CESAREAN SECTION  1990  . COLONOSCOPY    . LAPAROSCOPIC OVARIAN CYSTECTOMY     post cyst rupture  . LEFT AND RIGHT HEART CATHETERIZATION WITH CORONARY ANGIOGRAM N/A 06/27/2014   Procedure: LEFT AND RIGHT HEART CATHETERIZATION WITH CORONARY ANGIOGRAM;  Surgeon: Corky CraftsJayadeep S Varanasi, MD;  [facility];  Service: Cardiovascular;  Laterality: N/A;  . NASAL SINUS SURGERY    . SACROILIAC JOINT FUSION Right 05/17/2015   Procedure: SACROILIAC JOINT FUSION;  Surgeon: Estill BambergMark Dumonski, MD;  [facility];  Service: Orthopedics;  Laterality: Right;  Right sided sacroiliac joint fusion  . WISDOM TOOTH EXTRACTION  Past Medical History:  Diagnosis Date  . Anemia   . Cardiomyopathy (HCC) 06/23/2014  . CHF (congestive heart failure) (HCC)   . Chronic lower back pain   . Constipation   . DDD (degenerative disc disease)    at L4-5 and L5-S1  . Essential hypertension 06/23/2014  . Family history of adverse reaction to anesthesia    my daughter " came out in a rage."  . Obesity (BMI 30-39.9)   . PONV (postoperative nausea and vomiting)    There were no vitals taken for this visit.  Opioid Risk Score:   Fall Risk Score:  `1  Depression screen PHQ 2/9  Depression  screen [facility] 2/9 11/06/2015 01/23/2015 01/04/2015 12/26/2014 09/04/2014  Decreased Interest 0 2 3 3  0  Down, Depressed, Hopeless 0 2 3 3  0  PHQ - 2 Score 0 4 6 6  0  Altered sleeping - - 3 2 2   Tired, decreased energy - - 1 1 2   Change in appetite - - 0 0 0  Feeling bad or failure about yourself  - - 0 0 0  Trouble concentrating - - 0 0 0  Moving slowly or fidgety/restless - - 0 0 0  Suicidal thoughts - - 0 0 0  PHQ-9 Score - - 10 9 4   Difficult doing work/chores - - - Not difficult at all -     Review of Systems  Gastrointestinal: Positive for constipation and nausea.  Genitourinary: Positive for difficulty urinating.  Musculoskeletal: Positive for gait problem and joint swelling.  Neurological: Positive for tremors, weakness and numbness.       Tingling Spasms       Objective:   Physical Exam  Constitutional: She is oriented to person, place, and time. She appears well-developed and well-nourished.  HENT:  Head: Normocephalic and atraumatic.  Neck: Normal range of motion. Neck supple.  Cardiovascular: Normal rate and regular rhythm.   Pulmonary/Chest: Effort normal and breath sounds normal.  Musculoskeletal:  Normal Muscle Bulk and Muscle Testing Reveals: Upper Extremities: Full ROM and Muscle Strength 5/5 Lumbar Hypersensitivity Sacral Tenderness Noted Lower Extremities: Decreased ROM and Muscle Strength 4/5 Muscle Spasms noted with flexion Arises from chair slowly Antalgic gait   Neurological: She is alert and oriented to person, place, and time.  Skin: Skin is warm and dry.  Psychiatric: She has a normal mood and affect.  Nursing note and vitals reviewed.         Assessment & Plan:  1. 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 We will continue the opioid monitoring program, this consists of regular clinic  visits, examinations, urine drug screen, pill counts as well as use of [place] Controlled Substance Reporting System. 3. Right sacroiliac disorder: S/P Sacroiliac Joint Fusion with Dr. Yevette Edwards 4. Insomnia: Continue:Ambien 10 mg one tablet at HS prn  5. Muscle Spasm: Continue Zanaflex/ Baclofen/ Flexeril.  6. Lumbar Radicular Pain: Continue: Gabapentin and Therma Care 7. Right Leg weakness: Dr. Yevette Edwards following  60 minutes of face to face patient care time was spent during this visit. All questions were encouraged and answered.

## 2016-04-01 ENCOUNTER — Ambulatory Visit: Admitting: Physical Medicine & Rehabilitation

## 2016-04-01 ENCOUNTER — Encounter: Payer: Self-pay | Admitting: Physical Medicine & Rehabilitation

## 2016-04-01 ENCOUNTER — Ambulatory Visit (HOSPITAL_BASED_OUTPATIENT_CLINIC_OR_DEPARTMENT_OTHER): Admitting: Physical Medicine & Rehabilitation

## 2016-04-01 ENCOUNTER — Encounter: Attending: Registered Nurse

## 2016-04-01 VITALS — BP 164/82 | HR 59 | Resp 14

## 2016-04-01 DIAGNOSIS — I110 Hypertensive heart disease with heart failure: Secondary | ICD-10-CM | POA: Insufficient documentation

## 2016-04-01 DIAGNOSIS — M5136 Other intervertebral disc degeneration, lumbar region: Secondary | ICD-10-CM | POA: Diagnosis not present

## 2016-04-01 DIAGNOSIS — M5137 Other intervertebral disc degeneration, lumbosacral region: Secondary | ICD-10-CM | POA: Diagnosis not present

## 2016-04-01 DIAGNOSIS — Z955 Presence of coronary angioplasty implant and graft: Secondary | ICD-10-CM | POA: Insufficient documentation

## 2016-04-01 DIAGNOSIS — G4700 Insomnia, unspecified: Secondary | ICD-10-CM | POA: Diagnosis not present

## 2016-04-01 DIAGNOSIS — M533 Sacrococcygeal disorders, not elsewhere classified: Secondary | ICD-10-CM

## 2016-04-01 DIAGNOSIS — Z79899 Other long term (current) drug therapy: Secondary | ICD-10-CM | POA: Diagnosis not present

## 2016-04-01 DIAGNOSIS — R251 Tremor, unspecified: Secondary | ICD-10-CM | POA: Diagnosis not present

## 2016-04-01 DIAGNOSIS — R29898 Other symptoms and signs involving the musculoskeletal system: Secondary | ICD-10-CM | POA: Diagnosis not present

## 2016-04-01 DIAGNOSIS — G894 Chronic pain syndrome: Secondary | ICD-10-CM | POA: Diagnosis not present

## 2016-04-01 DIAGNOSIS — Z5181 Encounter for therapeutic drug level monitoring: Secondary | ICD-10-CM | POA: Diagnosis not present

## 2016-04-01 DIAGNOSIS — Z90710 Acquired absence of both cervix and uterus: Secondary | ICD-10-CM | POA: Insufficient documentation

## 2016-04-01 DIAGNOSIS — M25561 Pain in right knee: Secondary | ICD-10-CM | POA: Insufficient documentation

## 2016-04-01 DIAGNOSIS — R200 Anesthesia of skin: Secondary | ICD-10-CM | POA: Insufficient documentation

## 2016-04-01 DIAGNOSIS — R531 Weakness: Secondary | ICD-10-CM | POA: Insufficient documentation

## 2016-04-01 DIAGNOSIS — I429 Cardiomyopathy, unspecified: Secondary | ICD-10-CM | POA: Diagnosis not present

## 2016-04-01 DIAGNOSIS — Z981 Arthrodesis status: Secondary | ICD-10-CM | POA: Diagnosis not present

## 2016-04-01 DIAGNOSIS — M961 Postlaminectomy syndrome, not elsewhere classified: Secondary | ICD-10-CM

## 2016-04-01 DIAGNOSIS — R296 Repeated falls: Secondary | ICD-10-CM | POA: Diagnosis not present

## 2016-04-01 DIAGNOSIS — G8929 Other chronic pain: Secondary | ICD-10-CM | POA: Diagnosis not present

## 2016-04-01 DIAGNOSIS — M62838 Other muscle spasm: Secondary | ICD-10-CM | POA: Insufficient documentation

## 2016-04-01 DIAGNOSIS — I509 Heart failure, unspecified: Secondary | ICD-10-CM | POA: Diagnosis not present

## 2016-04-01 DIAGNOSIS — M5416 Radiculopathy, lumbar region: Secondary | ICD-10-CM | POA: Diagnosis not present

## 2016-04-01 DIAGNOSIS — D649 Anemia, unspecified: Secondary | ICD-10-CM | POA: Diagnosis not present

## 2016-04-01 DIAGNOSIS — Z8249 Family history of ischemic heart disease and other diseases of the circulatory system: Secondary | ICD-10-CM | POA: Insufficient documentation

## 2016-04-01 DIAGNOSIS — E669 Obesity, unspecified: Secondary | ICD-10-CM | POA: Diagnosis not present

## 2016-04-01 MED ORDER — OXYCODONE-ACETAMINOPHEN 5-325 MG PO TABS: 1.0000 | ORAL_TABLET | Freq: Two times a day (BID) | ORAL | 0 refills | Status: DC | PRN

## 2016-04-01 MED ORDER — OXYCODONE HCL ER 40 MG PO T12A: 40.0000 mg | EXTENDED_RELEASE_TABLET | Freq: Two times a day (BID) | ORAL | 0 refills | Status: DC

## 2016-04-01 NOTE — Patient Instructions (Signed)
We'll continue current medications Follow-up with Riley LamEunice

## 2016-04-01 NOTE — Progress Notes (Signed)
Subjective:    Patient ID: Molly Norton, female    DOB: 11/07/1965, 50 y.o.   MRN: [id number]  HPI Changed from Opana ER 20mg  BID to Oxycontin ER 40mg  BID Using oxycodone 5mg  for breakthrough pain twice a day Primary pain is low back as well as the right buttock area. Also feels that she has not regained her strength in the right lower extremity. Pain Inventory Average Pain 8 Pain Right Now 8 My pain is sharp, burning, stabbing, tingling and aching  In the last 24 hours, has pain interfered with the following? General activity 10 Relation with others 10 Enjoyment of life 10 What TIME of day is your pain at its worst? morning evening and night Sleep (in general) Poor  Pain is worse with: walking, bending, sitting, inactivity, standing and some activites Pain improves with: rest, heat/ice, therapy/exercise, pacing activities and medication Relief from Meds: not answered  Mobility walk with assistance use a cane use a walker how many minutes can you walk? 10 ability to climb steps?  no do you drive?  no  Function Do you have any goals in this area?  yes  Neuro/Psych bladder control problems weakness numbness tremor tingling trouble walking spasms  Prior Studies Any changes since last visit?  no  Physicians involved in your care Any changes since last visit?  no   Family History  Problem Relation Age of Onset  . Hypertension Mother   . Heart disease Father    Social History   Social History  . Marital status: Married    Spouse name: N/A  . Number of children: N/A  . Years of education: N/A   Social History Main Topics  . Smoking status: Never Smoker  . Smokeless tobacco: Never Used  . Alcohol use Yes     Comment: rare  . Drug use: No  . Sexual activity: Not Asked   Other Topics Concern  . None   Social History Narrative   Separated, lives alone, marital stress in past.    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 ABDOMINAL HYSTERECTOMY    . ANTERIOR LUMBAR FUSION  12/19/2010   L4-5 and L5-S1  . BREAST LUMPECTOMY    . CESAREAN SECTION  1990  . COLONOSCOPY    . LAPAROSCOPIC OVARIAN CYSTECTOMY     post cyst rupture  . LEFT AND RIGHT HEART CATHETERIZATION WITH CORONARY ANGIOGRAM N/A 06/27/2014   Procedure: LEFT AND RIGHT HEART CATHETERIZATION WITH CORONARY ANGIOGRAM;  Surgeon: Corky CraftsJayadeep S Varanasi, MD;  [facility];  Service: Cardiovascular;  Laterality: N/A;  . NASAL SINUS SURGERY    . SACROILIAC JOINT FUSION Right 05/17/2015   Procedure: SACROILIAC JOINT FUSION;  Surgeon: Estill BambergMark Dumonski, MD;  [facility];  Service: Orthopedics;  Laterality: Right;  Right sided sacroiliac joint fusion  . WISDOM TOOTH EXTRACTION     Past Medical History:  Diagnosis Date  . Anemia   . Cardiomyopathy (HCC) 06/23/2014  . CHF (congestive heart failure) (HCC)   . Chronic lower back pain   . Constipation   . DDD (degenerative disc disease)    at L4-5 and L5-S1  . Essential hypertension 06/23/2014  . Family history of adverse reaction to anesthesia    my daughter " came out in a rage."  . Obesity (BMI 30-39.9)   . PONV (postoperative nausea and vomiting)    BP (!) 164/82   Pulse [name](!)  59   Resp 14   SpO2 95%   Opioid Risk Score:   Fall Risk Score:  `1  Depression screen PHQ 2/9  Depression screen [facility] 2/9 04/01/2016 11/06/2015 01/23/2015 01/04/2015 12/26/2014 09/04/2014  Decreased Interest 0 0 2 3 3  0  Down, Depressed, Hopeless 0 0 2 3 3  0  PHQ - 2 Score 0 0 4 6 6  0  Altered sleeping - - - 3 2 2   Tired, decreased energy - - - 1 1 2   Change in appetite - - - 0 0 0  Feeling bad or failure about yourself  - - - 0 0 0  Trouble concentrating - - - 0 0 0  Moving slowly or fidgety/restless - - - 0 0 0  Suicidal thoughts - - - 0 0 0  PHQ-9 Score - - - 10 9 4   Difficult doing work/chores - - - - Not difficult at all -    Review of Systems  Cardiovascular:  Positive for leg swelling.  Gastrointestinal: Positive for constipation and nausea.  All other systems reviewed and are negative.      Objective:   Physical Exam  Constitutional: She is oriented to person, place, and time. She appears well-developed and well-nourished.  HENT:  Head: Normocephalic and atraumatic.  Eyes: Pupils are equal, round, and reactive to light.  Neurological: She is alert and oriented to person, place, and time.  Nursing note and vitals reviewed.   Tremor, but no evidence of clonus at the left ankle. Motor strength is 4 minus in the right hip flexor, knee extensor, ankle dorsiflexor, plantar flexor 5/5 and left hip flexor, knee extensor, ankle dorsi flexion, plantar flexor. Negative straight leg raise. Ambulates with a walker. She feels like her right lower extremity will give out. Mood and affect, very anxious     Assessment & Plan:  1.  Lumbar post laminectomy syndrome On chronic narcotics Oxycontin ER 40mg  BID Oxycodone 5mg  BID  2.  Right sacroiliac disorder- has had some numbness in inguinal and buttocks area on the Right may have some meralgia parasthetica but this would not explain groin pain  ~61mo post op from Sacroiliac fusion, patient does have some chronic postoperative pain around the hardware insertion site  3.  Right troch bursitis - improved after injection  4.  Lumbar spasms Baclofen 20mg  QID Tizanidine 4mg  TID Flexeril 10 mg at hs  5.  Insomnia ambien 10mg

## 2016-04-30 ENCOUNTER — Encounter: Attending: Registered Nurse | Admitting: Registered Nurse

## 2016-04-30 ENCOUNTER — Encounter: Payer: Self-pay | Admitting: Registered Nurse

## 2016-04-30 VITALS — BP 155/89 | HR 79

## 2016-04-30 DIAGNOSIS — D649 Anemia, unspecified: Secondary | ICD-10-CM | POA: Diagnosis not present

## 2016-04-30 DIAGNOSIS — G8929 Other chronic pain: Secondary | ICD-10-CM | POA: Diagnosis not present

## 2016-04-30 DIAGNOSIS — R531 Weakness: Secondary | ICD-10-CM | POA: Diagnosis not present

## 2016-04-30 DIAGNOSIS — R296 Repeated falls: Secondary | ICD-10-CM | POA: Insufficient documentation

## 2016-04-30 DIAGNOSIS — M5136 Other intervertebral disc degeneration, lumbar region: Secondary | ICD-10-CM | POA: Diagnosis not present

## 2016-04-30 DIAGNOSIS — M5137 Other intervertebral disc degeneration, lumbosacral region: Secondary | ICD-10-CM | POA: Diagnosis not present

## 2016-04-30 DIAGNOSIS — Z955 Presence of coronary angioplasty implant and graft: Secondary | ICD-10-CM | POA: Diagnosis not present

## 2016-04-30 DIAGNOSIS — M62838 Other muscle spasm: Secondary | ICD-10-CM | POA: Insufficient documentation

## 2016-04-30 DIAGNOSIS — M533 Sacrococcygeal disorders, not elsewhere classified: Secondary | ICD-10-CM | POA: Insufficient documentation

## 2016-04-30 DIAGNOSIS — I509 Heart failure, unspecified: Secondary | ICD-10-CM | POA: Diagnosis not present

## 2016-04-30 DIAGNOSIS — I429 Cardiomyopathy, unspecified: Secondary | ICD-10-CM | POA: Insufficient documentation

## 2016-04-30 DIAGNOSIS — Z8249 Family history of ischemic heart disease and other diseases of the circulatory system: Secondary | ICD-10-CM | POA: Insufficient documentation

## 2016-04-30 DIAGNOSIS — R251 Tremor, unspecified: Secondary | ICD-10-CM | POA: Diagnosis not present

## 2016-04-30 DIAGNOSIS — Z981 Arthrodesis status: Secondary | ICD-10-CM | POA: Diagnosis not present

## 2016-04-30 DIAGNOSIS — R5381 Other malaise: Secondary | ICD-10-CM

## 2016-04-30 DIAGNOSIS — Z5181 Encounter for therapeutic drug level monitoring: Secondary | ICD-10-CM | POA: Insufficient documentation

## 2016-04-30 DIAGNOSIS — Z90710 Acquired absence of both cervix and uterus: Secondary | ICD-10-CM | POA: Insufficient documentation

## 2016-04-30 DIAGNOSIS — G894 Chronic pain syndrome: Secondary | ICD-10-CM | POA: Diagnosis not present

## 2016-04-30 DIAGNOSIS — G4700 Insomnia, unspecified: Secondary | ICD-10-CM | POA: Diagnosis not present

## 2016-04-30 DIAGNOSIS — M25561 Pain in right knee: Secondary | ICD-10-CM | POA: Diagnosis not present

## 2016-04-30 DIAGNOSIS — M5416 Radiculopathy, lumbar region: Secondary | ICD-10-CM

## 2016-04-30 DIAGNOSIS — I110 Hypertensive heart disease with heart failure: Secondary | ICD-10-CM | POA: Diagnosis not present

## 2016-04-30 DIAGNOSIS — R29898 Other symptoms and signs involving the musculoskeletal system: Secondary | ICD-10-CM

## 2016-04-30 DIAGNOSIS — E669 Obesity, unspecified: Secondary | ICD-10-CM | POA: Insufficient documentation

## 2016-04-30 DIAGNOSIS — Z79899 Other long term (current) drug therapy: Secondary | ICD-10-CM

## 2016-04-30 DIAGNOSIS — R200 Anesthesia of skin: Secondary | ICD-10-CM | POA: Insufficient documentation

## 2016-04-30 DIAGNOSIS — M961 Postlaminectomy syndrome, not elsewhere classified: Secondary | ICD-10-CM | POA: Diagnosis not present

## 2016-04-30 MED ORDER — BACLOFEN 20 MG PO TABS: 20.0000 mg | ORAL_TABLET | Freq: Four times a day (QID) | ORAL | 5 refills | Status: DC

## 2016-04-30 MED ORDER — ZOLPIDEM TARTRATE 10 MG PO TABS: 10.0000 mg | ORAL_TABLET | Freq: Every evening | ORAL | 2 refills | Status: DC | PRN

## 2016-04-30 MED ORDER — THERMACARE BACK/HIP MISC: 2 refills | Status: DC

## 2016-04-30 MED ORDER — OXYCODONE HCL ER 40 MG PO T12A: 40.0000 mg | EXTENDED_RELEASE_TABLET | Freq: Two times a day (BID) | ORAL | 0 refills | Status: DC

## 2016-04-30 MED ORDER — CYCLOBENZAPRINE HCL 10 MG PO TABS: 10.0000 mg | ORAL_TABLET | Freq: Every day | ORAL | 2 refills | Status: DC

## 2016-04-30 MED ORDER — OXYCODONE-ACETAMINOPHEN 5-325 MG PO TABS: 1.0000 | ORAL_TABLET | Freq: Two times a day (BID) | ORAL | 0 refills | Status: DC | PRN

## 2016-04-30 MED ORDER — DICLOFENAC EPOLAMINE 1.3 % TD PTCH
MEDICATED_PATCH | TRANSDERMAL | 2 refills | Status: DC
Start: 2016-04-30 — End: 2016-09-25

## 2016-04-30 MED ORDER — TIZANIDINE HCL 4 MG PO CAPS: 4.0000 mg | ORAL_CAPSULE | Freq: Three times a day (TID) | ORAL | 2 refills | Status: DC | PRN

## 2016-04-30 NOTE — Progress Notes (Signed)
Subjective:    Patient ID: Molly RomeKimberly K Moree, female    DOB: May 08, 1966, 50 y.o.   MRN: [id number]  HPI: Molly Norton is a 50 year old female who returnsfor follow up appointment and medication refill. She states her pain is located in her lower back radiating into her right lower extremity laterally. She rates her pain 7. Her current exercise regime is attending physical therapy  twice a week and walking in her home with her walker.  Pain Inventory Average Pain 8 Pain Right Now 7 My pain is sharp, burning, stabbing, tingling and aching  In the last 24 hours, has pain interfered with the following? General activity 7 Relation with others 6 Enjoyment of life 7 What TIME of day is your pain at its worst? morning, evening, night Sleep (in general) Poor  Pain is worse with: walking, bending, sitting, inactivity, standing and some activites Pain improves with: rest, heat/ice, therapy/exercise, pacing activities and medication Relief from Meds: 5  Mobility walk with assistance use a cane use a walker how many minutes can you walk? 15 ability to climb steps?  no do you drive?  no use a wheelchair needs help with transfers Do you have any goals in this area?  yes  Function not employed: date last employed 2015 Do you have any goals in this area?  yes  Neuro/Psych bladder control problems weakness numbness tremor tingling trouble walking spasms  Prior Studies Any changes since last visit?  no  Physicians involved in your care Any changes since last visit?  no   Family History  Problem Relation Age of Onset  . Hypertension Mother   . Heart disease Father    Social History   Social History  . Marital status: Married    Spouse name: N/A  . Number of children: N/A  . Years of education: N/A   Social History Main Topics  . Smoking status: Never Smoker  . Smokeless tobacco: Never Used  . Alcohol use Yes     Comment: rare  . Drug use: No  .  Sexual activity: Not Asked   Other Topics Concern  . None   Social History Narrative   Separated, lives alone, marital stress in past.    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 ABDOMINAL HYSTERECTOMY    . ANTERIOR LUMBAR FUSION  12/19/2010   L4-5 and L5-S1  . BREAST LUMPECTOMY    . CESAREAN SECTION  1990  . COLONOSCOPY    . LAPAROSCOPIC OVARIAN CYSTECTOMY     post cyst rupture  . LEFT AND RIGHT HEART CATHETERIZATION WITH CORONARY ANGIOGRAM N/A 06/27/2014   Procedure: LEFT AND RIGHT HEART CATHETERIZATION WITH CORONARY ANGIOGRAM;  Surgeon: Corky CraftsJayadeep S Varanasi, MD;  [facility];  Service: Cardiovascular;  Laterality: N/A;  . NASAL SINUS SURGERY    . SACROILIAC JOINT FUSION Right 05/17/2015   Procedure: SACROILIAC JOINT FUSION;  Surgeon: Estill BambergMark Dumonski, MD;  [facility];  Service: Orthopedics;  Laterality: Right;  Right sided sacroiliac joint fusion  . WISDOM TOOTH EXTRACTION     Past Medical History:  Diagnosis Date  . Anemia   . Cardiomyopathy (HCC) 06/23/2014  . CHF (congestive heart failure) (HCC)   . Chronic lower back pain   . Constipation   . DDD (degenerative disc disease)    at L4-5 and L5-S1  . Essential hypertension 06/23/2014  . Family history of adverse reaction  to anesthesia    my daughter " came out in a rage."  . Obesity (BMI 30-39.9)   . PONV (postoperative nausea and vomiting)    BP (!) 155/89 (BP Location: Right Arm, Patient Position: Sitting, Cuff Size: Normal)   Pulse 79   SpO2 98%   Opioid Risk Score:   Fall Risk Score:  `1  Depression screen PHQ 2/9  [facility] 04/01/2016 11/06/2015 01/23/2015 01/04/2015 12/26/2014 09/04/2014  Decreased Interest 0 0 2 3 3  0  Down, Depressed, Hopeless 0 0 2 3 3  0  PHQ - 2 Score 0 0 4 6 6  0  Altered sleeping - - - 3 2 2   Tired, decreased energy - - - 1 1 2   Change in appetite - - - 0 0 0  Feeling bad or failure about  yourself  - - - 0 0 0  Trouble concentrating - - - 0 0 0  Moving slowly or fidgety/restless - - - 0 0 0  Suicidal thoughts - - - 0 0 0  PHQ-9 Score - - - 10 9 4   Difficult doing work/chores - - - - Not difficult at all -   Review of Systems  HENT: Negative.   Eyes: Negative.   Respiratory: Negative.   Cardiovascular: Negative.   Gastrointestinal: Positive for constipation and nausea.  Endocrine: Negative.   Genitourinary: Positive for difficulty urinating.  Musculoskeletal: Positive for gait problem.       Spasams  Allergic/Immunologic: Negative.   Neurological: Positive for tremors, weakness and numbness.       Tingling  Hematological: Negative.   Psychiatric/Behavioral: Negative.   All other systems reviewed and are negative.      Objective:   Physical Exam  Constitutional: She is oriented to person, place, and time. She appears well-developed and well-nourished.  HENT:  Head: Normocephalic and atraumatic.  Neck: Normal range of motion. Neck supple.  Cardiovascular: Normal rate and regular rhythm.   Pulmonary/Chest: Effort normal and breath sounds normal.  Musculoskeletal:  Normal Muscle Bulk and Muscle Testing Reveals: Upper Extremities: Full ROM and Muscle Strength 5/5 Lumbar Paraspinal Tenderness: L-3-L-5 Right Greater Trochanteric Tenderness Lower Extremities: Right Lower Extremity Decreased ROM and Muscle Strength 4/5 Right Lower Extremity Flexion Produces  Muscle Spasm into her lower Extremity Left Lower Extremity: Full ROM and Muscle Strength 5/5 Arises from Table Slowly using walker for support Antalgic Gait  Neurological: She is alert and oriented to person, place, and time.  Skin: Skin is warm and dry.  Psychiatric: She has a normal mood and affect.  Nursing note and vitals reviewed.         Assessment & Plan:  1. 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 We will continue the opioid monitoring program, this consists of regular clinic visits, examinations, urine drug screen, pill counts as well as use of [place] Controlled Substance Reporting System. 3. Right sacroiliac disorder: S/P Sacroiliac Joint Fusion with Dr. Yevette Edwardsumonski 4.Insomnia: Continue:Ambien 10 mg one tablet at HS prn  5. Muscle Spasm: Continue Zanaflex/ Baclofen/ Flexeril.  6. Lumbar Radicular Pain: Continue: Gabapentin and Therma Care 7. Right Leg weakness/ Physical Deconditioning: Dr. Yevette Edwardsumonski following. Continue Physical Therapy  30 minutes of face to face patient care time was spent during this visit. All questions were encouraged and answered.

## 2016-04-30 NOTE — Patient Instructions (Signed)
Increase Gabapentin to three capsules in the morning and at Bedtime  Call Office next weet week to evaluate [phone]   Call office the week of December 11th

## 2016-05-20 ENCOUNTER — Telehealth: Payer: Self-pay | Admitting: Physical Medicine & Rehabilitation

## 2016-05-20 NOTE — Telephone Encounter (Signed)
Return Ms. Clason Call, no answer. Left message to return the call.

## 2016-05-20 NOTE — Telephone Encounter (Signed)
Patient was told to call Molly Norton back the week of 12/11 about pain medication.

## 2016-06-05 ENCOUNTER — Encounter: Payer: Self-pay | Admitting: Registered Nurse

## 2016-06-05 ENCOUNTER — Encounter: Attending: Registered Nurse | Admitting: Registered Nurse

## 2016-06-05 VITALS — BP 102/66 | HR 70

## 2016-06-05 DIAGNOSIS — M5137 Other intervertebral disc degeneration, lumbosacral region: Secondary | ICD-10-CM | POA: Insufficient documentation

## 2016-06-05 DIAGNOSIS — D649 Anemia, unspecified: Secondary | ICD-10-CM | POA: Diagnosis not present

## 2016-06-05 DIAGNOSIS — N3941 Urge incontinence: Secondary | ICD-10-CM

## 2016-06-05 DIAGNOSIS — I110 Hypertensive heart disease with heart failure: Secondary | ICD-10-CM | POA: Diagnosis not present

## 2016-06-05 DIAGNOSIS — M533 Sacrococcygeal disorders, not elsewhere classified: Secondary | ICD-10-CM | POA: Insufficient documentation

## 2016-06-05 DIAGNOSIS — M62838 Other muscle spasm: Secondary | ICD-10-CM | POA: Diagnosis not present

## 2016-06-05 DIAGNOSIS — M5136 Other intervertebral disc degeneration, lumbar region: Secondary | ICD-10-CM | POA: Diagnosis not present

## 2016-06-05 DIAGNOSIS — R296 Repeated falls: Secondary | ICD-10-CM | POA: Insufficient documentation

## 2016-06-05 DIAGNOSIS — Z8249 Family history of ischemic heart disease and other diseases of the circulatory system: Secondary | ICD-10-CM | POA: Insufficient documentation

## 2016-06-05 DIAGNOSIS — Z981 Arthrodesis status: Secondary | ICD-10-CM | POA: Insufficient documentation

## 2016-06-05 DIAGNOSIS — G894 Chronic pain syndrome: Secondary | ICD-10-CM | POA: Insufficient documentation

## 2016-06-05 DIAGNOSIS — Z79899 Other long term (current) drug therapy: Secondary | ICD-10-CM | POA: Diagnosis not present

## 2016-06-05 DIAGNOSIS — I429 Cardiomyopathy, unspecified: Secondary | ICD-10-CM | POA: Insufficient documentation

## 2016-06-05 DIAGNOSIS — G8929 Other chronic pain: Secondary | ICD-10-CM | POA: Diagnosis not present

## 2016-06-05 DIAGNOSIS — M5416 Radiculopathy, lumbar region: Secondary | ICD-10-CM | POA: Insufficient documentation

## 2016-06-05 DIAGNOSIS — R29898 Other symptoms and signs involving the musculoskeletal system: Secondary | ICD-10-CM | POA: Insufficient documentation

## 2016-06-05 DIAGNOSIS — R251 Tremor, unspecified: Secondary | ICD-10-CM | POA: Insufficient documentation

## 2016-06-05 DIAGNOSIS — I509 Heart failure, unspecified: Secondary | ICD-10-CM | POA: Diagnosis not present

## 2016-06-05 DIAGNOSIS — E669 Obesity, unspecified: Secondary | ICD-10-CM | POA: Insufficient documentation

## 2016-06-05 DIAGNOSIS — G4700 Insomnia, unspecified: Secondary | ICD-10-CM

## 2016-06-05 DIAGNOSIS — R531 Weakness: Secondary | ICD-10-CM | POA: Diagnosis not present

## 2016-06-05 DIAGNOSIS — Z955 Presence of coronary angioplasty implant and graft: Secondary | ICD-10-CM | POA: Insufficient documentation

## 2016-06-05 DIAGNOSIS — M25561 Pain in right knee: Secondary | ICD-10-CM | POA: Diagnosis not present

## 2016-06-05 DIAGNOSIS — Z5181 Encounter for therapeutic drug level monitoring: Secondary | ICD-10-CM | POA: Diagnosis not present

## 2016-06-05 DIAGNOSIS — Z90710 Acquired absence of both cervix and uterus: Secondary | ICD-10-CM | POA: Insufficient documentation

## 2016-06-05 DIAGNOSIS — R200 Anesthesia of skin: Secondary | ICD-10-CM | POA: Insufficient documentation

## 2016-06-05 DIAGNOSIS — M961 Postlaminectomy syndrome, not elsewhere classified: Secondary | ICD-10-CM | POA: Diagnosis not present

## 2016-06-05 MED ORDER — OXYCODONE-ACETAMINOPHEN 5-325 MG PO TABS: 1.0000 | ORAL_TABLET | Freq: Two times a day (BID) | ORAL | 0 refills | Status: DC | PRN

## 2016-06-05 MED ORDER — OXYCODONE HCL ER 40 MG PO T12A: 40.0000 mg | EXTENDED_RELEASE_TABLET | Freq: Two times a day (BID) | ORAL | 0 refills | Status: DC

## 2016-06-05 MED ORDER — THERMACARE BACK/HIP MISC: 2 refills | Status: DC

## 2016-06-05 MED ORDER — POISE MAXIMUM ABSORBENCY PADS: MEDICATED_PAD | 0 refills | Status: DC

## 2016-06-05 NOTE — Progress Notes (Signed)
Subjective:    Patient ID: Molly Norton, female    DOB: 03/23/1966, 50 y.o.   MRN: [id number]  HPI: [name] is a 50 year old female who returnsfor follow up appointment and medication refill. She states her pain is located in her lower back radiating into her right lower extremity laterally. She rates her pain 6. Her current exercise regime is walking in her home with her walker. Also states she is experiencing urine incontinence, she will speak with Dr. Yevette Edwardsumonski, Urology consult placed today, she verbalizes understanding.   Pain Inventory Average Pain 7 Pain Right Now 6 My pain is sharp, burning, stabbing, tingling and aching  In the last 24 hours, has pain interfered with the following? General activity 7 Relation with others 5 Enjoyment of life 7 What TIME of day is your pain at its worst? morning, evening, night Sleep (in general) Poor  Pain is worse with: walking, bending, sitting, inactivity, standing and some activites Pain improves with: rest, heat/ice, therapy/exercise, pacing activities and medication Relief from Meds: 6  Mobility walk with assistance use a cane use a walker ability to climb steps?  no do you drive?  no use a wheelchair needs help with transfers Do you have any goals in this area?  yes  Function Do you have any goals in this area?  yes  Neuro/Psych bladder control problems weakness numbness tremor tingling trouble walking spasms  Prior Studies Any changes since last visit?  no  Physicians involved in your care Any changes since last visit?  no   Family History  Problem Relation Age of Onset  . Hypertension Mother   . Heart disease Father    Social History   Social History  . Marital status: Married    Spouse name: N/A  . Number of children: N/A  . Years of education: N/A   Social History Main Topics  . Smoking status: Never Smoker  . Smokeless tobacco: Never Used  . Alcohol use Yes     Comment:  rare  . Drug use: No  . Sexual activity: Not Asked   Other Topics Concern  . None   Social History Narrative   Separated, lives alone, marital stress in past.    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 ABDOMINAL HYSTERECTOMY    . ANTERIOR LUMBAR FUSION  12/19/2010   L4-5 and L5-S1  . BREAST LUMPECTOMY    . CESAREAN SECTION  1990  . COLONOSCOPY    . LAPAROSCOPIC OVARIAN CYSTECTOMY     post cyst rupture  . LEFT AND RIGHT HEART CATHETERIZATION WITH CORONARY ANGIOGRAM N/A 06/27/2014   Procedure: LEFT AND RIGHT HEART CATHETERIZATION WITH CORONARY ANGIOGRAM;  Surgeon: Corky CraftsJayadeep S Varanasi, MD;  [facility];  Service: Cardiovascular;  Laterality: N/A;  . NASAL SINUS SURGERY    . SACROILIAC JOINT FUSION Right 05/17/2015   Procedure: SACROILIAC JOINT FUSION;  Surgeon: Estill BambergMark Dumonski, MD;  [facility];  Service: Orthopedics;  Laterality: Right;  Right sided sacroiliac joint fusion  . WISDOM TOOTH EXTRACTION     Past Medical History:  Diagnosis Date  . Anemia   . Cardiomyopathy (HCC) 06/23/2014  . CHF (congestive heart failure) (HCC)   . Chronic lower back pain   . Constipation   . DDD (degenerative disc disease)    at L4-5 and L5-S1  . Essential hypertension 06/23/2014  . Family history of adverse reaction  to anesthesia    my daughter " came out in a rage."  . Obesity (BMI 30-39.9)   . PONV (postoperative nausea and vomiting)    BP 102/66   Pulse (!) 51   SpO2 98%   Opioid Risk Score:   Fall Risk Score:  `1  Depression screen PHQ 2/9  Depression screen [facility] 2/9 04/01/2016 11/06/2015 01/23/2015 01/04/2015 12/26/2014 09/04/2014  Decreased Interest 0 0 2 3 3  0  Down, Depressed, Hopeless 0 0 2 3 3  0  PHQ - 2 Score 0 0 4 6 6  0  Altered sleeping - - - 3 2 2   Tired, decreased energy - - - 1 1 2   Change in appetite - - - 0 0 0  Feeling bad or failure about yourself  - - - 0 0 0  Trouble concentrating - -  - 0 0 0  Moving slowly or fidgety/restless - - - 0 0 0  Suicidal thoughts - - - 0 0 0  PHQ-9 Score - - - 10 9 4   Difficult doing work/chores - - - - Not difficult at all -    Review of Systems  HENT: Negative.   Eyes: Negative.   Respiratory: Negative.   Cardiovascular: Negative.   Gastrointestinal: Negative.   Endocrine: Negative.   Genitourinary: Positive for urgency.  Musculoskeletal: Positive for back pain and gait problem.       Spasms  Allergic/Immunologic: Negative.   Neurological: Positive for tremors, weakness and numbness.       Tingling  Hematological: Negative.   Psychiatric/Behavioral: Negative.   All other systems reviewed and are negative.      Objective:   Physical Exam  Constitutional: She appears well-developed and well-nourished.  HENT:  Head: Normocephalic and atraumatic.  Neck: Normal range of motion. Neck supple.  Cardiovascular: Normal rate and regular rhythm.   Pulmonary/Chest: Effort normal and breath sounds normal.  Musculoskeletal:  Normal Muscle Bulk and Muscle Testing Reveals:  Upper Extremities: Full ROM and Muscle Strength 5/5 Back without spinal Tenderness Lower Extremities: Full ROM and Muscle Strength 5/5 Right Lower Extremity Flexion Produces Muscle Spasm into Right lower Extremity Arises from chair slowly using walker for support Antalgic gait  Neurological: She is alert.  Skin: Skin is warm and dry.  Psychiatric: She has a normal mood and affect.  Nursing note and vitals reviewed.         Assessment & Plan:  1. 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 We will continue the opioid monitoring program, this consists of regular clinic visits, examinations, urine drug screen, pill counts as well as use of [place] Controlled Substance Reporting System. 2. Lumbar Radicular  Pain: Continue Gabapentin and Therma Care 3. Right sacroiliac disorder: S/P Sacroiliac Joint Fusion with Dr. Yevette Edwards 4.Insomnia: Continue:Ambien 10 mg one tablet at HS prn  5. Muscle Spasm: Continue Zanaflex/ Baclofen/ Flexeril.  6. Right Leg weakness/ Physical Deconditioning: Referral for Physical Therapy, awaiting appointment.Dr. Yevette Edwards following.  7. Urge Incontinence of Urine: Referral for Urology> [place] Ordered.  45 minutes of face to face patient care time was spent during this visit. All questions were encouraged and answered.  Spent over 30 minutes educating patient on various medical modalities as it relates to her care and diagnosis. Follow up calls were made to [facility] in regards to Physical Therapy  ordered placed last month.

## 2016-07-01 ENCOUNTER — Encounter: Attending: Registered Nurse | Admitting: Registered Nurse

## 2016-07-01 ENCOUNTER — Encounter: Payer: Self-pay | Admitting: Registered Nurse

## 2016-07-01 VITALS — BP 142/83 | HR 94 | Resp 14

## 2016-07-01 DIAGNOSIS — Z8249 Family history of ischemic heart disease and other diseases of the circulatory system: Secondary | ICD-10-CM | POA: Insufficient documentation

## 2016-07-01 DIAGNOSIS — G894 Chronic pain syndrome: Secondary | ICD-10-CM

## 2016-07-01 DIAGNOSIS — G4700 Insomnia, unspecified: Secondary | ICD-10-CM | POA: Diagnosis not present

## 2016-07-01 DIAGNOSIS — D649 Anemia, unspecified: Secondary | ICD-10-CM | POA: Diagnosis not present

## 2016-07-01 DIAGNOSIS — M5416 Radiculopathy, lumbar region: Secondary | ICD-10-CM | POA: Insufficient documentation

## 2016-07-01 DIAGNOSIS — I509 Heart failure, unspecified: Secondary | ICD-10-CM | POA: Diagnosis not present

## 2016-07-01 DIAGNOSIS — Z981 Arthrodesis status: Secondary | ICD-10-CM | POA: Insufficient documentation

## 2016-07-01 DIAGNOSIS — Z79899 Other long term (current) drug therapy: Secondary | ICD-10-CM

## 2016-07-01 DIAGNOSIS — R5381 Other malaise: Secondary | ICD-10-CM | POA: Diagnosis not present

## 2016-07-01 DIAGNOSIS — M533 Sacrococcygeal disorders, not elsewhere classified: Secondary | ICD-10-CM | POA: Diagnosis not present

## 2016-07-01 DIAGNOSIS — I429 Cardiomyopathy, unspecified: Secondary | ICD-10-CM | POA: Diagnosis not present

## 2016-07-01 DIAGNOSIS — E669 Obesity, unspecified: Secondary | ICD-10-CM | POA: Diagnosis not present

## 2016-07-01 DIAGNOSIS — Z5181 Encounter for therapeutic drug level monitoring: Secondary | ICD-10-CM | POA: Diagnosis not present

## 2016-07-01 DIAGNOSIS — N3941 Urge incontinence: Secondary | ICD-10-CM | POA: Insufficient documentation

## 2016-07-01 DIAGNOSIS — M62838 Other muscle spasm: Secondary | ICD-10-CM | POA: Diagnosis not present

## 2016-07-01 DIAGNOSIS — M961 Postlaminectomy syndrome, not elsewhere classified: Secondary | ICD-10-CM | POA: Diagnosis not present

## 2016-07-01 DIAGNOSIS — G8929 Other chronic pain: Secondary | ICD-10-CM | POA: Insufficient documentation

## 2016-07-01 DIAGNOSIS — Z79891 Long term (current) use of opiate analgesic: Secondary | ICD-10-CM | POA: Diagnosis not present

## 2016-07-01 DIAGNOSIS — Z760 Encounter for issue of repeat prescription: Secondary | ICD-10-CM | POA: Diagnosis not present

## 2016-07-01 DIAGNOSIS — Z955 Presence of coronary angioplasty implant and graft: Secondary | ICD-10-CM | POA: Insufficient documentation

## 2016-07-01 DIAGNOSIS — I110 Hypertensive heart disease with heart failure: Secondary | ICD-10-CM | POA: Insufficient documentation

## 2016-07-01 DIAGNOSIS — R531 Weakness: Secondary | ICD-10-CM | POA: Insufficient documentation

## 2016-07-01 DIAGNOSIS — Z90710 Acquired absence of both cervix and uterus: Secondary | ICD-10-CM | POA: Insufficient documentation

## 2016-07-01 MED ORDER — OXYCODONE HCL ER 40 MG PO T12A: 40.0000 mg | EXTENDED_RELEASE_TABLET | Freq: Two times a day (BID) | ORAL | 0 refills | Status: DC

## 2016-07-01 MED ORDER — ZOLPIDEM TARTRATE 10 MG PO TABS: 10.0000 mg | ORAL_TABLET | Freq: Every evening | ORAL | 2 refills | Status: DC | PRN

## 2016-07-01 MED ORDER — TIZANIDINE HCL 4 MG PO CAPS: 4.0000 mg | ORAL_CAPSULE | Freq: Three times a day (TID) | ORAL | 2 refills | Status: DC | PRN

## 2016-07-01 MED ORDER — OXYCODONE-ACETAMINOPHEN 5-325 MG PO TABS: 1.0000 | ORAL_TABLET | Freq: Two times a day (BID) | ORAL | 0 refills | Status: DC | PRN

## 2016-07-01 NOTE — Progress Notes (Signed)
Subjective:    Patient ID: Molly Norton, female    DOB: 09/06/1965, 51 y.o.   MRN: [id number]  HPI: [name] is a 51 year old female who returnsfor follow up appointment and medication refill. She states her pain is located in her lower back radiating into herright lower extremity laterally. She rates her pain 4.Her current exercise regime is attending physical therapy twice a week at [facility] and walking in her home with her walker.   Pain Inventory Average Pain 7 Pain Right Now 4 Molly pain is sharp, burning, stabbing, tingling and aching  In the last 24 hours, has pain interfered with the following? General activity 6 Relation with others 4 Enjoyment of life 4 What TIME of day is your pain at its worst? morning,evening,night Sleep (in general) Poor  Pain is worse with: walking, bending, sitting, inactivity, standing and some activites Pain improves with: rest, heat/ice, therapy/exercise, pacing activities and medication Relief from Meds: n/a  Mobility walk with assistance use a cane use a walker how many minutes can you walk? 10-15 ability to climb steps?  no do you drive?  no use a wheelchair needs help with transfers Do you have any goals in this area?  yes  Function Do you have any goals in this area?  yes  Neuro/Psych bladder control problems weakness numbness tremor tingling trouble walking dizziness  Prior Studies Any changes since last visit?  no  Physicians involved in your care Any changes since last visit?  no   Family History  Problem Relation Age of Onset  . Hypertension Mother   . Heart disease Father    Social History   Social History  . Marital status: Married    Spouse name: N/A  . Number of children: N/A  . Years of education: N/A   Social History Main Topics  . Smoking status: Never Smoker  . Smokeless tobacco: Never Used  . Alcohol use Yes     Comment: rare  . Drug use: No  . Sexual  activity: Not Asked   Other Topics Concern  . None   Social History Narrative   Separated, lives alone, marital stress in past.    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 ABDOMINAL HYSTERECTOMY    . ANTERIOR LUMBAR FUSION  12/19/2010   L4-5 and L5-S1  . BREAST LUMPECTOMY    . CESAREAN SECTION  1990  . COLONOSCOPY    . LAPAROSCOPIC OVARIAN CYSTECTOMY     post cyst rupture  . LEFT AND RIGHT HEART CATHETERIZATION WITH CORONARY ANGIOGRAM N/A 06/27/2014   Procedure: LEFT AND RIGHT HEART CATHETERIZATION WITH CORONARY ANGIOGRAM;  Surgeon: Corky CraftsJayadeep S Varanasi, MD;  [facility];  Service: Cardiovascular;  Laterality: N/A;  . NASAL SINUS SURGERY    . SACROILIAC JOINT FUSION Right 05/17/2015   Procedure: SACROILIAC JOINT FUSION;  Surgeon: Estill BambergMark Dumonski, MD;  [facility];  Service: Orthopedics;  Laterality: Right;  Right sided sacroiliac joint fusion  . WISDOM TOOTH EXTRACTION     Past Medical History:  Diagnosis Date  . Anemia   . Cardiomyopathy (HCC) 06/23/2014  . CHF (congestive heart failure) (HCC)   . Chronic lower back pain   . Constipation   . DDD (degenerative disc disease)    at L4-5 and L5-S1  . Essential hypertension 06/23/2014  . Family history of adverse reaction to anesthesia    Molly Norton "  came out in a rage."  . Obesity (BMI 30-39.9)   . PONV (postoperative nausea and vomiting)    BP (!) 142/83   Pulse 94   Resp 14   SpO2 99%   Opioid Risk Score:   Fall Risk Score:  `1  Depression screen PHQ 2/9  Depression screen [facility] 2/9 04/01/2016 11/06/2015 01/23/2015 01/04/2015 12/26/2014 09/04/2014  Decreased Interest 0 0 2 3 3  0  Down, Depressed, Hopeless 0 0 2 3 3  0  PHQ - 2 Score 0 0 4 6 6  0  Altered sleeping - - - 3 2 2   Tired, decreased energy - - - 1 1 2   Change in appetite - - - 0 0 0  Feeling bad or failure about yourself  - - - 0 0 0  Trouble concentrating - - - 0 0 0  Moving  slowly or fidgety/restless - - - 0 0 0  Suicidal thoughts - - - 0 0 0  PHQ-9 Score - - - 10 9 4   Difficult doing work/chores - - - - Not difficult at all -     Review of Systems  Constitutional: Negative.   HENT: Negative.   Eyes: Negative.   Respiratory: Negative.   Cardiovascular: Negative.   Gastrointestinal: Negative.   Endocrine: Negative.   Genitourinary: Negative.   Musculoskeletal: Negative.   Skin: Negative.   Allergic/Immunologic: Negative.   Neurological: Negative.   Hematological: Negative.   Psychiatric/Behavioral: Negative.   All other systems reviewed and are negative.      Objective:   Physical Exam  Constitutional: She is oriented to person, place, and time. She appears well-developed and well-nourished.  HENT:  Head: Normocephalic and atraumatic.  Neck: Normal range of motion. Neck supple.  Cardiovascular: Normal rate and regular rhythm.   Pulmonary/Chest: Effort normal and breath sounds normal.  Musculoskeletal:  Normal Muscle Bulk and Muscle Testing Reveals: Upper Extremities: Full ROM and Muscle Strength 5/5 Lumbar Paraspinal Tenderness: L-4-L-5 Mainly Right Side Lower Extremities: Left: Full ROM and Muscle Strength 5/5 Right: Decreased ROM and Muscle Strength 4/5 Right Lower Extremity Flexion Produces Muscle Spasm into extremity Arises from chair slowly using walker for support Antalgic Gait    Neurological: She is alert and oriented to person, place, and time.  Skin: Skin is warm and dry.  Psychiatric: She has a normal mood and affect.  Nursing note and vitals reviewed.         Assessment & Plan:  1. 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 We will continue the opioid monitoring program, this consists of regular clinic visits, examinations, urine drug screen, pill counts as well as use of  [place] Controlled Substance Reporting System. 2. Lumbar Radicular Pain: Continue Gabapentin and Therma Care 3. Right sacroiliac disorder: S/P Sacroiliac Joint Fusion with Dr. Yevette Edwards 4.Insomnia: Continue:Ambien 10 mg one tablet at HS prn  5. Muscle Spasm: Continue Zanaflex/ Baclofen/ Flexeril.  6. Right Leg weakness/ Physical Deconditioning: Continue Physical Therapy, 7. Urge Incontinence of Urine: Awaiting for Urology Following.  30 minutes of face to face patient care time was spent during this visit. All questions were encouraged and answered.  F/U in 1 month

## 2016-07-01 NOTE — Patient Instructions (Signed)
[facility] : [address], [place], [place] [id number]  Hours:  Open today  8AM-5PM                       Phone: [phone]

## 2016-07-04 ENCOUNTER — Telehealth: Payer: Self-pay | Admitting: *Deleted

## 2016-07-04 NOTE — Telephone Encounter (Signed)
On January 26th, 2018, NCCSR was reviewed. No conflict was seen [place] the [place] Cont[place] System with multiple prescribers. Molly Norton has signed a Narcotic Contract with our office. If there are any discrepancies this will be reported to [employer] Kirsteins,MD.

## 2016-07-04 NOTE — Telephone Encounter (Signed)
Phoned [facility] [address] to give the approval per NP for patient to receive medication. Also phoned patient was unable to leave a voicemail message.

## 2016-07-09 ENCOUNTER — Telehealth: Payer: Self-pay | Admitting: Physical Medicine & Rehabilitation

## 2016-07-09 NOTE — Telephone Encounter (Signed)
They need a new PT order signed by MD per [place] Dept of Labor

## 2016-07-10 NOTE — Telephone Encounter (Signed)
It looks like it is at [facility], will but through new order

## 2016-07-10 NOTE — Addendum Note (Signed)
Addended by: Erick ColaceKIRSTEINS, ANDREW E on: 07/10/2016 12:48 PM   Modules accepted: Orders

## 2016-07-16 ENCOUNTER — Telehealth: Payer: Self-pay | Admitting: *Deleted

## 2016-07-16 MED ORDER — POISE MAXIMUM ABSORBENCY PADS: MEDICATED_PAD | 0 refills | Status: DC

## 2016-07-29 NOTE — Telephone Encounter (Signed)
I spoke with Molly BradfordKimberly and explained the situation.  She is going to talk with the pharmacy there to see if she can fill an out of state Rx for narcotic and then arrange for family member to pick up Rx if that is ok.  Otherwise she will be out of her narcotic [place]. She will call back after she talks with pharmacy.

## 2016-07-29 NOTE — Telephone Encounter (Signed)
We do not do this type of thing. Patient should've contacted us before she left the state.

## 2016-07-29 NOTE — Telephone Encounter (Signed)
Patient states she is out of state due to a family emergency. She will not be back until the second week of March. Patient states she will be out of medication soon. Patient would like a partial refill of her medications that could hold her over if possible, to her 3/12 appointment. Medication can be sent to:   [facility]  [address]  [place] [id number] [phone]. Patient can be reached at [phone] if there are any questions or concerns.

## 2016-07-30 ENCOUNTER — Encounter: Payer: Federal, State, Local not specified - PPO | Admitting: Registered Nurse

## 2016-08-01 NOTE — Telephone Encounter (Signed)
Molly BradfordKimberly did not call back so encounter closed.

## 2016-08-04 ENCOUNTER — Telehealth: Payer: Self-pay | Admitting: *Deleted

## 2016-08-04 MED ORDER — OXYCODONE-ACETAMINOPHEN 5-325 MG PO TABS: 1.0000 | ORAL_TABLET | Freq: Two times a day (BID) | ORAL | 0 refills | Status: DC | PRN

## 2016-08-04 MED ORDER — CYCLOBENZAPRINE HCL 10 MG PO TABS: 10.0000 mg | ORAL_TABLET | Freq: Every day | ORAL | 2 refills | Status: DC

## 2016-08-04 MED ORDER — OXYCODONE HCL ER 40 MG PO T12A: 40.0000 mg | EXTENDED_RELEASE_TABLET | Freq: Two times a day (BID) | ORAL | 0 refills | Status: DC

## 2016-08-04 NOTE — Telephone Encounter (Signed)
See previous phone message.  Cala BradfordKimberly is in [place] after her grandmother had a stroke and is going to be out of her medication before she gets back for her appt 08/18/16.  [facility] [place] [facility].  She will have her husband pick up the RXs and he will get them to her in [place].  RXs printed for her oxycodone acetaminophen and Oxycontin and signed by Dr Wynn BankerKirsteins.

## 2016-08-18 ENCOUNTER — Encounter: Admitting: Registered Nurse

## 2016-08-19 ENCOUNTER — Telehealth: Payer: Self-pay | Admitting: Registered Nurse

## 2016-08-19 ENCOUNTER — Encounter: Payer: Self-pay | Admitting: Registered Nurse

## 2016-08-19 ENCOUNTER — Encounter: Attending: Registered Nurse | Admitting: Registered Nurse

## 2016-08-19 VITALS — BP 150/79 | HR 92

## 2016-08-19 DIAGNOSIS — E669 Obesity, unspecified: Secondary | ICD-10-CM | POA: Diagnosis not present

## 2016-08-19 DIAGNOSIS — Z981 Arthrodesis status: Secondary | ICD-10-CM | POA: Insufficient documentation

## 2016-08-19 DIAGNOSIS — G4700 Insomnia, unspecified: Secondary | ICD-10-CM | POA: Diagnosis not present

## 2016-08-19 DIAGNOSIS — I509 Heart failure, unspecified: Secondary | ICD-10-CM | POA: Insufficient documentation

## 2016-08-19 DIAGNOSIS — M5136 Other intervertebral disc degeneration, lumbar region: Secondary | ICD-10-CM | POA: Diagnosis not present

## 2016-08-19 DIAGNOSIS — D649 Anemia, unspecified: Secondary | ICD-10-CM | POA: Diagnosis not present

## 2016-08-19 DIAGNOSIS — R29898 Other symptoms and signs involving the musculoskeletal system: Secondary | ICD-10-CM | POA: Diagnosis not present

## 2016-08-19 DIAGNOSIS — Z90710 Acquired absence of both cervix and uterus: Secondary | ICD-10-CM | POA: Insufficient documentation

## 2016-08-19 DIAGNOSIS — R296 Repeated falls: Secondary | ICD-10-CM | POA: Diagnosis not present

## 2016-08-19 DIAGNOSIS — G8929 Other chronic pain: Secondary | ICD-10-CM | POA: Diagnosis not present

## 2016-08-19 DIAGNOSIS — M533 Sacrococcygeal disorders, not elsewhere classified: Secondary | ICD-10-CM | POA: Diagnosis not present

## 2016-08-19 DIAGNOSIS — R200 Anesthesia of skin: Secondary | ICD-10-CM | POA: Diagnosis not present

## 2016-08-19 DIAGNOSIS — R251 Tremor, unspecified: Secondary | ICD-10-CM | POA: Insufficient documentation

## 2016-08-19 DIAGNOSIS — M62838 Other muscle spasm: Secondary | ICD-10-CM | POA: Diagnosis not present

## 2016-08-19 DIAGNOSIS — M961 Postlaminectomy syndrome, not elsewhere classified: Secondary | ICD-10-CM | POA: Diagnosis not present

## 2016-08-19 DIAGNOSIS — R531 Weakness: Secondary | ICD-10-CM | POA: Insufficient documentation

## 2016-08-19 DIAGNOSIS — M5137 Other intervertebral disc degeneration, lumbosacral region: Secondary | ICD-10-CM | POA: Diagnosis not present

## 2016-08-19 DIAGNOSIS — Z5181 Encounter for therapeutic drug level monitoring: Secondary | ICD-10-CM | POA: Diagnosis not present

## 2016-08-19 DIAGNOSIS — Z955 Presence of coronary angioplasty implant and graft: Secondary | ICD-10-CM | POA: Insufficient documentation

## 2016-08-19 DIAGNOSIS — I110 Hypertensive heart disease with heart failure: Secondary | ICD-10-CM | POA: Insufficient documentation

## 2016-08-19 DIAGNOSIS — Z79899 Other long term (current) drug therapy: Secondary | ICD-10-CM

## 2016-08-19 DIAGNOSIS — G894 Chronic pain syndrome: Secondary | ICD-10-CM | POA: Diagnosis not present

## 2016-08-19 DIAGNOSIS — M25561 Pain in right knee: Secondary | ICD-10-CM | POA: Diagnosis not present

## 2016-08-19 DIAGNOSIS — M5416 Radiculopathy, lumbar region: Secondary | ICD-10-CM

## 2016-08-19 DIAGNOSIS — I429 Cardiomyopathy, unspecified: Secondary | ICD-10-CM | POA: Insufficient documentation

## 2016-08-19 DIAGNOSIS — Z8249 Family history of ischemic heart disease and other diseases of the circulatory system: Secondary | ICD-10-CM | POA: Insufficient documentation

## 2016-08-19 MED ORDER — OXYCODONE-ACETAMINOPHEN 5-325 MG PO TABS: 1.0000 | ORAL_TABLET | Freq: Two times a day (BID) | ORAL | 0 refills | Status: DC | PRN

## 2016-08-19 MED ORDER — OXYCODONE HCL ER 40 MG PO T12A
40.0000 mg | EXTENDED_RELEASE_TABLET | Freq: Two times a day (BID) | ORAL | 0 refills | Status: DC
Start: 2016-08-19 — End: 2016-09-25

## 2016-08-19 NOTE — Telephone Encounter (Signed)
On 08/19/2016 the NCCSR was reviewed no conflict was seen on the [facility] [place] Cont[place] Substance Reporting System with multiple prescribers. [name] has a signed narcotic contract with our office. If there were any discrepancies this would have been reported to her physician.

## 2016-08-19 NOTE — Progress Notes (Signed)
Subjective:    Patient ID: Molly Norton, female    DOB: January 20, 1966, 51 y.o.   MRN: [id number]  HPI: Molly Norton is a 51 year old female who returnsfor follow up appointment and medication refill. She states her pain is located in her lower back radiating into herright lower extremity laterally. She rates her pain 4.Her current exercise regime is  walking in her home with her walker. Ms. Trieu had a few concerns and spoke with Ship broker today.   Pain Inventory Average Pain 8 Pain Right Now 4 My pain is sharp, burning, stabbing, tingling and aching  In the last 24 hours, has pain interfered with the following? General activity 6 Relation with others 4 Enjoyment of life 5 What TIME of day is your pain at its worst? all Sleep (in general) Poor  Pain is worse with: walking, bending, sitting, inactivity, standing and some activites Pain improves with: rest, heat/ice, therapy/exercise, pacing activities, medication, TENS and injections Relief from Meds: 7  Mobility walk with assistance use a walker ability to climb steps?  no do you drive?  no use a wheelchair needs help with transfers  Function not employed: date last employed .  Neuro/Psych bladder control problems weakness numbness tremor tingling trouble walking spasms  Prior Studies Any changes since last visit?  no  Physicians involved in your care Any changes since last visit?  no   Family History  Problem Relation Age of Onset  . Hypertension Mother   . Heart disease Father    Social History   Social History  . Marital status: Married    Spouse Norton: N/A  . Number of children: N/A  . Years of education: N/A   Social History Main Topics  . Smoking status: Never Smoker  . Smokeless tobacco: Never Used  . Alcohol use Yes     Comment: rare  . Drug use: No  . Sexual activity: Not on file   Other Topics Concern  . Not on file   Social History Narrative   Separated,  lives alone, marital stress in past.    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 ABDOMINAL HYSTERECTOMY    . ANTERIOR LUMBAR FUSION  12/19/2010   L4-5 and L5-S1  . BREAST LUMPECTOMY    . CESAREAN SECTION  1990  . COLONOSCOPY    . LAPAROSCOPIC OVARIAN CYSTECTOMY     post cyst rupture  . LEFT AND RIGHT HEART CATHETERIZATION WITH CORONARY ANGIOGRAM N/A 06/27/2014   Procedure: LEFT AND RIGHT HEART CATHETERIZATION WITH CORONARY ANGIOGRAM;  Surgeon: Corky Crafts, MD;  Location: [facility] CATH LAB;  Service: Cardiovascular;  Laterality: N/A;  . NASAL SINUS SURGERY    . SACROILIAC JOINT FUSION Right 05/17/2015   Procedure: SACROILIAC JOINT FUSION;  Surgeon: Estill Bamberg, MD;  Location: [facility] OR;  Service: Orthopedics;  Laterality: Right;  Right sided sacroiliac joint fusion  . WISDOM TOOTH EXTRACTION     Past Medical History:  Diagnosis Date  . Anemia   . Cardiomyopathy (HCC) 06/23/2014  . CHF (congestive heart failure) (HCC)   . Chronic lower back pain   . Constipation   . DDD (degenerative disc disease)    at L4-5 and L5-S1  . Essential hypertension 06/23/2014  . Family history of adverse reaction to anesthesia    my daughter " came out in a rage."  . Obesity (BMI 30-39.9)   . PONV (postoperative  nausea and vomiting)    There were no vitals taken for this visit.  Opioid Risk Score:   Fall Risk Score:  `1  Depression screen PHQ 2/9  [facility] 04/01/2016 11/06/2015 01/23/2015 01/04/2015 12/26/2014 09/04/2014  Decreased Interest 0 0 2 3 3  0  Down, Depressed, Hopeless 0 0 2 3 3  0  PHQ - 2 Score 0 0 4 6 6  0  Altered sleeping - - - 3 2 2   Tired, decreased energy - - - 1 1 2   Change in appetite - - - 0 0 0  Feeling bad or failure about yourself  - - - 0 0 0  Trouble concentrating - - - 0 0 0  Moving slowly or fidgety/restless - - - 0 0 0  Suicidal thoughts - - - 0 0 0  PHQ-9 Score - - - 10 9 4   Difficult  doing work/chores - - - - Not difficult at all -    Review of Systems  Constitutional: Negative.   HENT: Negative.   Eyes: Negative.   Respiratory: Negative.   Cardiovascular: Negative.   Gastrointestinal: Positive for constipation.  Endocrine: Negative.   Genitourinary: Negative.   Musculoskeletal: Positive for joint swelling.  Skin: Negative.   Allergic/Immunologic: Negative.   Neurological: Negative.   Hematological: Negative.   Psychiatric/Behavioral: Negative.   All other systems reviewed and are negative.      Objective:   Physical Exam  Constitutional: She is oriented to person, place, and time. She appears well-developed and well-nourished.  HENT:  Head: Normocephalic and atraumatic.  Neck: Normal range of motion. Neck supple.  Cardiovascular: Normal rate and regular rhythm.   Pulmonary/Chest: Effort normal and breath sounds normal.  Musculoskeletal:  Normal Muscle Bulk and Muscle Testing Reveals:  Upper Extremities: Full ROM and Muscle Strength 5/5 Lumbar Paraspinal Tenderness: L-3-L-5 Mainly Right Side Lower Extremities: Right Decreased ROM with Spasms notes and Muscle Strength 4/5 Left: Full ROM and Muscle Strength 5/5 Arises from Table slowly using walker for support Antalgic gait  Neurological: She is alert and oriented to person, place, and time.  Skin: Skin is warm and dry.  Psychiatric: She has a normal mood and affect.  Nursing note and vitals reviewed.         Assessment & Plan:  1. 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 We will continue the opioid monitoring program, this consists of regular clinic visits, examinations, urine drug screen, pill counts as well as use of [place] Controlled Substance Reporting [place]. 2. Lumbar Radicular Pain: Continue Gabapentin and Therma Care.  08/19/2016 3. Right sacroiliac disorder: S/P Sacroiliac Joint Fusion with Dr. Yevette Edwardsumonski. 08/19/2016 4.Insomnia: Continue:Ambien 10 mg one tablet at HS prn. 08/19/2016 5. Muscle Spasm: Continue Zanaflex/ Baclofen/ Flexeril. 08/19/2016 6. Right Leg weakness/ Physical Deconditioning: Continue HEP as tolerated. 08/19/2016  30 minutes of face to face patient care time was spent during this visit. All questions were encouraged and answered.  F/U in 1 month

## 2016-08-22 LAB — TOXASSURE SELECT,+ANTIDEPR,UR

## 2016-08-23 ENCOUNTER — Emergency Department (HOSPITAL_COMMUNITY)
Admission: EM | Admit: 2016-08-23 | Discharge: 2016-08-23 | Disposition: A | Discharge status: Home / Self Care | Payer: Federal, State, Local not specified - PPO | Attending: Emergency Medicine | Admitting: Emergency Medicine

## 2016-08-23 ENCOUNTER — Emergency Department (HOSPITAL_COMMUNITY): Payer: Federal, State, Local not specified - PPO

## 2016-08-23 ENCOUNTER — Encounter (HOSPITAL_COMMUNITY): Payer: Self-pay | Admitting: Emergency Medicine

## 2016-08-23 DIAGNOSIS — I110 Hypertensive heart disease with heart failure: Secondary | ICD-10-CM | POA: Insufficient documentation

## 2016-08-23 DIAGNOSIS — R079 Chest pain, unspecified: Secondary | ICD-10-CM

## 2016-08-23 DIAGNOSIS — I509 Heart failure, unspecified: Secondary | ICD-10-CM | POA: Insufficient documentation

## 2016-08-23 DIAGNOSIS — Z79899 Other long term (current) drug therapy: Secondary | ICD-10-CM | POA: Insufficient documentation

## 2016-08-23 DIAGNOSIS — Z91040 Latex allergy status: Secondary | ICD-10-CM | POA: Insufficient documentation

## 2016-08-23 LAB — CBC
HCT: 39.6 % (ref 36.0–46.0)
Hemoglobin: 13 g/dL (ref 12.0–15.0)
MCH: 27.5 pg (ref 26.0–34.0)
MCHC: 32.8 g/dL (ref 30.0–36.0)
MCV: 83.7 fL (ref 78.0–100.0)
PLATELETS: 200 10*3/uL (ref 150–400)
RBC: 4.73 MIL/uL (ref 3.87–5.11)
RDW: 13.6 % (ref 11.5–15.5)
WBC: 10.2 10*3/uL (ref 4.0–10.5)

## 2016-08-23 LAB — BASIC METABOLIC PANEL
Anion gap: 11 (ref 5–15)
BUN: 6 mg/dL (ref 6–20)
CHLORIDE: 106 mmol/L (ref 101–111)
CO2: 20 mmol/L — AB (ref 22–32)
CREATININE: 0.67 mg/dL (ref 0.44–1.00)
Calcium: 9.1 mg/dL (ref 8.9–10.3)
GFR calc non Af Amer: >60 mL/min (ref >60)
GLUCOSE: 83 mg/dL (ref 65–99)
Potassium: 3.7 mmol/L (ref 3.5–5.1)
Sodium: 137 mmol/L (ref 135–145)

## 2016-08-23 LAB — D-DIMER, QUANTITATIVE: D-Dimer, Quant: 0.36 ug/mL-FEU (ref 0.00–0.50)

## 2016-08-23 LAB — I-STAT TROPONIN, ED: Troponin i, poc: 0 ng/mL (ref 0.00–0.08)

## 2016-08-23 MED ORDER — LORAZEPAM 2 MG/ML IJ SOLN
1.0000 mg | Freq: Once | INTRAMUSCULAR | Status: AC
  Administered 2016-08-23: 1 mg via INTRAVENOUS
  Filled 2016-08-23: qty 1

## 2016-08-23 MED ORDER — SODIUM CHLORIDE 0.9 % IV BOLUS (SEPSIS)
500.0000 mL | Freq: Once | INTRAVENOUS | Status: AC
  Administered 2016-08-23: 500 mL via INTRAVENOUS

## 2016-08-23 NOTE — Discharge Instructions (Signed)
Tests showed no life-threatening condition. Recommend nitroglycerin for chest pain. Follow-up with your cardiologist next week or return here if worse.

## 2016-08-23 NOTE — ED Triage Notes (Signed)
Pt c/o onset last night, with center chest pain described as tightness with shortness of breath. Pt took 1 nitro last night with relief.

## 2016-08-23 NOTE — ED Provider Notes (Signed)
[facility]-EMERGENCY DEPT Provider Note   CSN: [id number] Arrival date & time: 08/23/16  1116     History   Chief Complaint Chief Complaint  Patient presents with  . Chest Pain    HPI Molly Norton is a 51 y.o. female.  Episode of central chest pain last night in the late evening. Patient felt flushed and lightheaded. Blood pressure was 161/108 at the time. She took 1 nitroglycerin tablet which seemed to help a minimal amount. She had slight dyspnea and nausea, but no diaphoresis. Past medical history includes cardiomyopathy, hypertension, congestive heart failure.  She is feeling better at this time. Nothing makes symptoms better or worse.      Past Medical History:  Diagnosis Date  . Anemia   . Cardiomyopathy (HCC) 06/23/2014  . CHF (congestive heart failure) (HCC)   . Chronic lower back pain   . Constipation   . DDD (degenerative disc disease)    at L4-5 and L5-S1  . Essential hypertension 06/23/2014  . Family history of adverse reaction to anesthesia    my daughter " came out in a rage."  . Obesity (BMI 30-39.9)   . PONV (postoperative nausea and vomiting)     Patient Active Problem List   Diagnosis Date Noted  . Trochanteric bursitis of right hip 01/01/2016  . Postlaminectomy syndrome, lumbar region 12/26/2014  . Sacroiliac dysfunction 10/27/2014  . Muscle spasm 10/27/2014  . Abnormal echocardiogram   . Cardiomyopathy (HCC) 06/23/2014  . Essential hypertension 06/23/2014  . Dyspnea 06/23/2014  . Chest pain 06/23/2014  . Chronic pain syndrome 08/28/2011    Past Surgical History:  Procedure Laterality Date  . ABDOMINAL HYSTERECTOMY    . ANTERIOR LUMBAR FUSION  12/19/2010   L4-5 and L5-S1  . BREAST LUMPECTOMY    . CESAREAN SECTION  1990  . COLONOSCOPY    . LAPAROSCOPIC OVARIAN CYSTECTOMY     post cyst rupture  . LEFT AND RIGHT HEART CATHETERIZATION WITH CORONARY ANGIOGRAM N/A 06/27/2014   Procedure: LEFT AND RIGHT HEART CATHETERIZATION WITH CORONARY  ANGIOGRAM;  Surgeon: Corky Crafts, MD;  Location: [facility] CATH LAB;  Service: Cardiovascular;  Laterality: N/A;  . NASAL SINUS SURGERY    . SACROILIAC JOINT FUSION Right 05/17/2015   Procedure: SACROILIAC JOINT FUSION;  Surgeon: Estill Bamberg, MD;  Location: [facility] OR;  Service: Orthopedics;  Laterality: Right;  Right sided sacroiliac joint fusion  . WISDOM TOOTH EXTRACTION      OB History    No data available       Home Medications    Prior to Admission medications   Medication Sig Start Date End Date Taking? Authorizing Provider  baclofen (LIORESAL) 20 MG tablet Take 1 tablet (20 mg total) by mouth 4 (four) times daily. 04/30/16   Jones Bales, NP  [facility] SENNA 8.6 MG tablet TAKE 2 TABLETS (17.2 MG TOTAL) BY MOUTH DAILY AS NEEDED. 11/07/15   Erick Colace, MD  cyclobenzaprine (FLEXERIL) 10 MG tablet Take 1 tablet (10 mg total) by mouth at bedtime. 08/04/16   Erick Colace, MD  diclofenac (FLECTOR) 1.3 % PTCH PLACE 1 PATCH ONTO THE SKIN 2 (TWO) TIMES DAILY. 04/30/16   Jones Bales, NP  furosemide (LASIX) 40 MG tablet Take 40 mg by mouth daily.     Historical Provider, MD  gabapentin (NEURONTIN) 100 MG capsule Take two capsules four times a day 12/04/15   Jones Bales, NP  Heat Wraps [facility] BACK/HIP) MISC APPLY 1 PATCH EVERY DAY  06/05/16   Jones Bales, NP  hydrALAZINE (APRESOLINE) 50 MG tablet Take 50 mg by mouth 2 (two) times daily.     Historical Provider, MD  hydrochlorothiazide (HYDRODIURIL) 25 MG tablet Take 25 mg by mouth daily.    Historical Provider, MD  Incontinence Supply Disposable (POISE MAXIMUM ABSORBENCY) PADS Use 5 pads a day 07/16/16   Jones Bales, NP  nitroGLYCERIN (NITROSTAT) 0.4 MG SL tablet Place 1 tablet (0.4 mg total) under the tongue every 5 (five) minutes as needed for chest pain. 11/20/14   Mirian Mo, MD  oxyCODONE (OXYCONTIN) 40 mg 12 hr tablet Take 1 tablet (40 mg total) by mouth every 12 (twelve) hours. 08/19/16   Jones Bales, NP    oxyCODONE-acetaminophen (PERCOCET) 5-325 MG tablet Take 1 tablet by mouth 2 (two) times daily as needed for severe pain. 08/19/16   Jones Bales, NP  polyethylene glycol powder (GLYCOLAX/MIRALAX) powder TAKE 17 G BY MOUTH 2 (TWO) TIMES DAILY. 11/07/15   Erick Colace, MD  spironolactone (ALDACTONE) 25 MG tablet Take 25 mg by mouth daily.    Historical Provider, MD  tiZANidine (ZANAFLEX) 4 MG capsule Take 1 capsule (4 mg total) by mouth 3 (three) times daily as needed for muscle spasms. 07/01/16   Jones Bales, NP  zolpidem (AMBIEN) 10 MG tablet Take 1 tablet (10 mg total) by mouth at bedtime as needed for sleep. 07/01/16 07/31/16  Jones Bales, NP    Family History Family History  Problem Relation Age of Onset  . Hypertension Mother   . Heart disease Father     Social History Social History  Substance Use Topics  . Smoking status: Never Smoker  . Smokeless tobacco: Never Used  . Alcohol use Yes     Comment: rare     Allergies   Adhesive [tape]; Latex; Lisinopril; Relafen [nabumetone]; Valium [diazepam]; Codeine; Other; and Robaxin [methocarbamol]   Review of Systems Review of Systems  All other systems reviewed and are negative.    Physical Exam Updated Vital Signs BP (!) 146/84   Pulse (!) 107   Resp (!) 21   Ht 5' 7.5" (1.715 m)   Wt 196 lb (88.9 kg)   SpO2 100%   BMI 30.24 kg/m   Physical Exam  Constitutional: She is oriented to person, place, and time. She appears well-developed and well-nourished.  HENT:  Head: Normocephalic and atraumatic.  Eyes: Conjunctivae are normal.  Neck: Neck supple.  Cardiovascular: Normal rate and regular rhythm.   Pulmonary/Chest: Effort normal and breath sounds normal.  Abdominal: Soft. Bowel sounds are normal.  Musculoskeletal: Normal range of motion.  Neurological: She is alert and oriented to person, place, and time.  Skin: Skin is warm and dry.  Psychiatric: She has a normal mood and affect. Her behavior is  normal.  Nursing note and vitals reviewed.    ED Treatments / Results  Labs (all labs ordered are listed, but only abnormal results are displayed) Labs Reviewed  BASIC METABOLIC PANEL - Abnormal; Notable for the following:       Result Value   CO2 20 (*)    All other components within normal limits  CBC  D-DIMER, QUANTITATIVE (NOT AT [facility])  Rosezena Sensor, ED  Rosezena Sensor, ED    EKG  EKG Interpretation  Date/Time:  Saturday August 23 2016 11:24:38 EDT Ventricular Rate:  107 PR Interval:    QRS Duration: 85 QT Interval:  350 QTC Calculation: 467 R Axis:  43 Text Interpretation:  Sinus tachycardia Paired ventricular premature complexes LAE, consider biatrial enlargement RSR' in V1 or V2, probably normal variant Probable left ventricular hypertrophy Confirmed by Adriana SimasOOK  MD, Marialice Newkirk ([id number]) on 08/23/2016 1:27:29 PM       Radiology Dg Chest 2 View  Result Date: 08/23/2016 CLINICAL DATA:  Chest pressure and shortness of breath since yesterday. Some relief with nitroglycerin. EXAM: CHEST  2 VIEW COMPARISON:  11/20/2014 FINDINGS: The heart size and mediastinal contours are within normal limits. Both lungs are clear. The visualized skeletal structures are unremarkable. IMPRESSION: No active cardiopulmonary disease. Electronically Signed   By: Elberta Fortisaniel  Boyle M.D.   On: 08/23/2016 12:00    Procedures Procedures (including critical care time)  Medications Ordered in ED Medications  sodium chloride 0.9 % bolus 500 mL (500 mLs Intravenous New Bag/Given 08/23/16 1257)  LORazepam (ATIVAN) injection 1 mg (1 mg Intravenous Given 08/23/16 1257)     Initial Impression / Assessment and Plan / ED Course  I have reviewed the triage vital signs and the nursing notes.  Pertinent labs & imaging results that were available during my care of the patient were reviewed by me and considered in my medical decision making (see chart for details).     Patient is hemodynamically stable. She is  slightly tachycardic, but in no acute distress. D-dimer and troponin were negative. Hemoglobin stable. Chest x-ray negative. I recommended she take nitroglycerin for chest pain and follow-up with her cardiologist.  Final Clinical Impressions(s) / ED Diagnoses   Final diagnoses:  Chest pain, unspecified type    New Prescriptions New Prescriptions   No medications on file     Donnetta HutchingBrian Trenyce Loera, MD 08/23/16 1534

## 2016-08-23 NOTE — ED Notes (Signed)
Patient transported to X-ray 

## 2016-08-23 NOTE — ED Notes (Signed)
GOT PATIENT UNDRESSED ON THE MONITOR DID EKG SHOWN TO Dr Adriana Simasook

## 2016-08-26 ENCOUNTER — Other Ambulatory Visit: Payer: Self-pay | Admitting: Registered Nurse

## 2016-08-26 NOTE — Telephone Encounter (Signed)
Recieved electronic refill request for flector patches, no mention in notes to continue this medication, please advise

## 2016-09-09 DIAGNOSIS — I429 Cardiomyopathy, unspecified: Secondary | ICD-10-CM | POA: Diagnosis not present

## 2016-09-09 DIAGNOSIS — I119 Hypertensive heart disease without heart failure: Secondary | ICD-10-CM | POA: Diagnosis not present

## 2016-09-09 DIAGNOSIS — R0600 Dyspnea, unspecified: Secondary | ICD-10-CM | POA: Diagnosis not present

## 2016-09-10 DIAGNOSIS — R0600 Dyspnea, unspecified: Secondary | ICD-10-CM | POA: Diagnosis not present

## 2016-09-10 DIAGNOSIS — E668 Other obesity: Secondary | ICD-10-CM | POA: Diagnosis not present

## 2016-09-15 DIAGNOSIS — R0609 Other forms of dyspnea: Secondary | ICD-10-CM | POA: Diagnosis not present

## 2016-09-22 ENCOUNTER — Telehealth: Payer: Self-pay | Admitting: Registered Nurse

## 2016-09-22 NOTE — Telephone Encounter (Signed)
Molly Norton had a UDS performed on 08/19/2016, it was consistent.

## 2016-09-25 ENCOUNTER — Encounter: Payer: Self-pay | Admitting: Registered Nurse

## 2016-09-25 ENCOUNTER — Ambulatory Visit: Admitting: Physical Medicine & Rehabilitation

## 2016-09-25 ENCOUNTER — Encounter: Attending: Registered Nurse | Admitting: Registered Nurse

## 2016-09-25 ENCOUNTER — Encounter

## 2016-09-25 VITALS — BP 116/68 | HR 84

## 2016-09-25 DIAGNOSIS — N3941 Urge incontinence: Secondary | ICD-10-CM | POA: Diagnosis not present

## 2016-09-25 DIAGNOSIS — R251 Tremor, unspecified: Secondary | ICD-10-CM | POA: Diagnosis not present

## 2016-09-25 DIAGNOSIS — M961 Postlaminectomy syndrome, not elsewhere classified: Secondary | ICD-10-CM

## 2016-09-25 DIAGNOSIS — R296 Repeated falls: Secondary | ICD-10-CM | POA: Insufficient documentation

## 2016-09-25 DIAGNOSIS — Z90710 Acquired absence of both cervix and uterus: Secondary | ICD-10-CM | POA: Insufficient documentation

## 2016-09-25 DIAGNOSIS — G894 Chronic pain syndrome: Secondary | ICD-10-CM | POA: Diagnosis present

## 2016-09-25 DIAGNOSIS — I110 Hypertensive heart disease with heart failure: Secondary | ICD-10-CM | POA: Insufficient documentation

## 2016-09-25 DIAGNOSIS — M25561 Pain in right knee: Secondary | ICD-10-CM | POA: Insufficient documentation

## 2016-09-25 DIAGNOSIS — M5416 Radiculopathy, lumbar region: Secondary | ICD-10-CM | POA: Diagnosis not present

## 2016-09-25 DIAGNOSIS — R531 Weakness: Secondary | ICD-10-CM | POA: Diagnosis not present

## 2016-09-25 DIAGNOSIS — I429 Cardiomyopathy, unspecified: Secondary | ICD-10-CM | POA: Diagnosis not present

## 2016-09-25 DIAGNOSIS — I509 Heart failure, unspecified: Secondary | ICD-10-CM | POA: Insufficient documentation

## 2016-09-25 DIAGNOSIS — M62838 Other muscle spasm: Secondary | ICD-10-CM | POA: Diagnosis not present

## 2016-09-25 DIAGNOSIS — R200 Anesthesia of skin: Secondary | ICD-10-CM | POA: Diagnosis not present

## 2016-09-25 DIAGNOSIS — M5137 Other intervertebral disc degeneration, lumbosacral region: Secondary | ICD-10-CM | POA: Diagnosis not present

## 2016-09-25 DIAGNOSIS — R5381 Other malaise: Secondary | ICD-10-CM | POA: Diagnosis not present

## 2016-09-25 DIAGNOSIS — M5136 Other intervertebral disc degeneration, lumbar region: Secondary | ICD-10-CM | POA: Insufficient documentation

## 2016-09-25 DIAGNOSIS — Z79899 Other long term (current) drug therapy: Secondary | ICD-10-CM

## 2016-09-25 DIAGNOSIS — R29898 Other symptoms and signs involving the musculoskeletal system: Secondary | ICD-10-CM | POA: Diagnosis not present

## 2016-09-25 DIAGNOSIS — Z5181 Encounter for therapeutic drug level monitoring: Secondary | ICD-10-CM

## 2016-09-25 DIAGNOSIS — G4700 Insomnia, unspecified: Secondary | ICD-10-CM | POA: Diagnosis not present

## 2016-09-25 DIAGNOSIS — D649 Anemia, unspecified: Secondary | ICD-10-CM | POA: Diagnosis not present

## 2016-09-25 DIAGNOSIS — E669 Obesity, unspecified: Secondary | ICD-10-CM | POA: Diagnosis not present

## 2016-09-25 DIAGNOSIS — Z981 Arthrodesis status: Secondary | ICD-10-CM | POA: Insufficient documentation

## 2016-09-25 DIAGNOSIS — M533 Sacrococcygeal disorders, not elsewhere classified: Secondary | ICD-10-CM | POA: Insufficient documentation

## 2016-09-25 DIAGNOSIS — Z8249 Family history of ischemic heart disease and other diseases of the circulatory system: Secondary | ICD-10-CM | POA: Insufficient documentation

## 2016-09-25 DIAGNOSIS — Z955 Presence of coronary angioplasty implant and graft: Secondary | ICD-10-CM | POA: Insufficient documentation

## 2016-09-25 DIAGNOSIS — G8929 Other chronic pain: Secondary | ICD-10-CM | POA: Insufficient documentation

## 2016-09-25 MED ORDER — OXYCODONE HCL ER 40 MG PO T12A: 40.0000 mg | EXTENDED_RELEASE_TABLET | Freq: Two times a day (BID) | ORAL | 0 refills | Status: DC

## 2016-09-25 MED ORDER — ZOLPIDEM TARTRATE 10 MG PO TABS: 10.0000 mg | ORAL_TABLET | Freq: Every evening | ORAL | 2 refills | Status: DC | PRN

## 2016-09-25 MED ORDER — DICLOFENAC EPOLAMINE 1.3 % TD PTCH: MEDICATED_PATCH | TRANSDERMAL | 2 refills | Status: DC

## 2016-09-25 MED ORDER — POLYETHYLENE GLYCOL 3350 17 GM/SCOOP PO POWD: ORAL | 4 refills | Status: DC

## 2016-09-25 MED ORDER — OXYCODONE-ACETAMINOPHEN 5-325 MG PO TABS: 1.0000 | ORAL_TABLET | Freq: Two times a day (BID) | ORAL | 0 refills | Status: DC | PRN

## 2016-09-25 MED ORDER — THERMACARE BACK/HIP MISC: 2 refills | Status: DC

## 2016-09-25 MED ORDER — CYCLOBENZAPRINE HCL 10 MG PO TABS: 10.0000 mg | ORAL_TABLET | Freq: Every day | ORAL | 2 refills | Status: AC

## 2016-09-25 MED ORDER — GABAPENTIN 100 MG PO CAPS: ORAL_CAPSULE | ORAL | 3 refills | Status: DC

## 2016-09-25 MED ORDER — POISE MAXIMUM ABSORBENCY PADS: MEDICATED_PAD | 0 refills | Status: DC

## 2016-09-25 MED ORDER — TIZANIDINE HCL 4 MG PO CAPS: 4.0000 mg | ORAL_CAPSULE | Freq: Three times a day (TID) | ORAL | 2 refills | Status: DC | PRN

## 2016-09-25 NOTE — Progress Notes (Signed)
Subjective:    Patient ID: Molly Norton, female    DOB: 09/15/1965, 51 y.o.   MRN: [id number]  HPI: Molly Norton is a 51 year old female who returnsfor follow up appointment and medication refill. She states Molly pain is located in Molly lower back radiating into herright lower extremity laterally and right hip. Molly Norton states Molly pain has increased in intensity, she wasn't able to find Molly prescription and was out of Molly medication for over 10 days, she found Molly prescription today. With assessment she has facial grimacing and tears. Emotional support given, she was instructed to call office  with any situation, she verbalizes understanding. She rates Molly pain 9.Molly current exercise regime is walking in Molly home with Molly walker. Molly last UDS was on 08/19/2016, it was consistent.  [name] went to [facility] ED on 08/23/2016 for Chest Pain.  Pain Inventory Average Pain 6 Pain Right Now 9 My pain is sharp, burning, stabbing and aching  In the last 24 hours, has pain interfered with the following? General activity 10 Relation with others 6 Enjoyment of life 10 What TIME of day is your pain at its worst? morning daytime and night Sleep (in general) Poor  Pain is worse with: walking, bending, sitting, inactivity, standing and some activites Pain improves with: rest, heat/ice, therapy/exercise, pacing activities, medication and injections Relief from Meds: 8  Mobility walk without assistance walk with assistance use a cane use a walker how many minutes can you walk? 10-15 ability to climb steps?  no  Function not employed: date last employed . I need assistance with the following:  bathing, toileting, meal prep, household duties and shopping  Neuro/Psych bladder control problems weakness numbness tingling trouble walking spasms  Prior Studies Any changes since last visit?  no  Physicians involved in your care Any changes since last visit?   no   Family History  Problem Relation Age of Onset  . Hypertension Mother   . Heart disease Father    Social History   Social History  . Marital status: Married    Spouse name: N/A  . Number of children: N/A  . Years of education: N/A   Social History Main Topics  . Smoking status: Never Smoker  . Smokeless tobacco: Never Used  . Alcohol use Yes     Comment: rare  . Drug use: No  . Sexual activity: Not Asked   Other Topics Concern  . None   Social History Narrative   Separated, lives alone, marital stress in past.    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 ABDOMINAL HYSTERECTOMY    . ANTERIOR LUMBAR FUSION  12/19/2010   L4-5 and L5-S1  . BREAST LUMPECTOMY    . CESAREAN SECTION  1990  . COLONOSCOPY    . LAPAROSCOPIC OVARIAN CYSTECTOMY     post cyst rupture  . LEFT AND RIGHT HEART CATHETERIZATION WITH CORONARY ANGIOGRAM N/A 06/27/2014   Procedure: LEFT AND RIGHT HEART CATHETERIZATION WITH CORONARY ANGIOGRAM;  Surgeon: Corky Crafts, MD;  Location: [facility] CATH LAB;  Service: Cardiovascular;  Laterality: N/A;  . NASAL SINUS SURGERY    . SACROILIAC JOINT FUSION Right 05/17/2015   Procedure: SACROILIAC JOINT FUSION;  Surgeon: Estill Bamberg, MD;  Location: [facility] OR;  Service: Orthopedics;  Laterality: Right;  Right sided sacroiliac joint fusion  . WISDOM TOOTH EXTRACTION     Past Medical History:  Diagnosis Date  . Anemia   . Cardiomyopathy (HCC) 06/23/2014  . CHF (congestive heart failure) (HCC)   . Chronic lower back pain   . Constipation   . DDD (degenerative disc disease)    at L4-5 and L5-S1  . Essential hypertension 06/23/2014  . Family history of adverse reaction to anesthesia    my Norton " came out in a rage."  . Obesity (BMI 30-39.9)   . PONV (postoperative nausea and vomiting)    BP 116/68 (BP Location: Left Arm, Patient Position: Sitting)   Pulse 84   SpO2 97%   Opioid Risk Score:    Fall Risk Score:  `1  Depression screen PHQ 2/9  Depression screen [facility] 2/9 04/01/2016 11/06/2015 01/23/2015 01/04/2015 12/26/2014 09/04/2014  Decreased Interest 0 0 0  Down, Depressed, Hopeless 0 0 0  PHQ - 2 Score 0 0 0  Altered sleeping - - - Tired, decreased energy - - - Change in appetite - - - 0 0 0  Feeling bad or failure about yourself  - - - 0 0 0  Trouble concentrating - - - 0 0 0  Moving slowly or fidgety/restless - - - 0 0 0  Suicidal thoughts - - - 0 0 0  PHQ-9 Score - - - Difficult doing work/chores - - - - Not difficult at all -    Review of Systems  HENT: Negative.   Eyes: Negative.   Respiratory: Negative.   Cardiovascular: Positive for leg swelling.  Gastrointestinal: Positive for constipation.  Endocrine: Negative.   Genitourinary:       Bladder control  Musculoskeletal: Positive for gait problem.       Spasms  Neurological: Positive for weakness and numbness.       Tingling  Hematological: Negative.   Psychiatric/Behavioral: Negative.   All other systems reviewed and are negative.      Objective:   Physical Exam  Constitutional: She is oriented to person, place, and time. She appears well-developed and well-nourished.  HENT:  Head: Normocephalic and atraumatic.  Neck: Normal range of motion. Neck supple.  Cardiovascular: Normal rate and regular rhythm.   Pulmonary/Chest: Effort normal and breath sounds normal.  Musculoskeletal:  Normal Muscle Bulk and Muscle Testing Reveals: Upper Extremities: Full ROM and Muscle Strength 5/5 Lumbar Paraspinal Tenderness: L-4-L-5 Lower Extremities: Right: Decreased ROM and Spasms noted with Flexion Muscle Strength 4/5 Left Full ROM and Muscle Strength 5/5 Arises from Table Slowly using walker for support Antalgic Gait   Neurological: She is alert and oriented to person, place, and time.  Skin: Skin is warm and dry.  Psychiatric: She has a normal mood and affect.   Nursing note and vitals reviewed.         Assessment & Plan:  1. 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 We will continue the opioid monitoring program, this consists of regular clinic visits, examinations, urine drug screen, pill counts as well as use of [place] Controlled Substance Reporting System. 2. Lumbar Radicular Pain: Continue Gabapentin and Therma Care. 09/25/2016 3. Right sacroiliac disorder: S/P Sacroiliac Joint Fusion with Dr. Yevette Edwards. 09/25/2016 4.Insomnia: Continue:Ambien 10 mg one tablet at HS prn. 09/25/2016 5. Muscle Spasm: Continue Zanaflex/ Baclofen/ Flexeril.  09/25/2016 6. Right Leg weakness/ Physical Deconditioning:RX: Referral to Physical Therapy.  Continue HEP as tolerated. 09/25/2016  30 minutes of face to face patient care time was spent during this visit. All questions were encouraged and answered.   F/U in 1 month

## 2016-09-29 ENCOUNTER — Ambulatory Visit: Payer: Self-pay | Admitting: Physical Medicine & Rehabilitation

## 2016-10-13 ENCOUNTER — Other Ambulatory Visit: Payer: Self-pay | Admitting: *Deleted

## 2016-10-13 MED ORDER — GABAPENTIN 100 MG PO CAPS: ORAL_CAPSULE | ORAL | 3 refills | Status: DC

## 2016-10-15 DIAGNOSIS — Z6829 Body mass index (BMI) 29.0-29.9, adult: Secondary | ICD-10-CM | POA: Diagnosis not present

## 2016-10-15 DIAGNOSIS — Z01419 Encounter for gynecological examination (general) (routine) without abnormal findings: Secondary | ICD-10-CM | POA: Diagnosis not present

## 2016-10-15 DIAGNOSIS — N3946 Mixed incontinence: Secondary | ICD-10-CM | POA: Diagnosis not present

## 2016-10-16 DIAGNOSIS — N6011 Diffuse cystic mastopathy of right breast: Secondary | ICD-10-CM | POA: Diagnosis not present

## 2016-10-16 DIAGNOSIS — N644 Mastodynia: Secondary | ICD-10-CM | POA: Diagnosis not present

## 2016-10-16 DIAGNOSIS — N6012 Diffuse cystic mastopathy of left breast: Secondary | ICD-10-CM | POA: Diagnosis not present

## 2016-10-23 DIAGNOSIS — R0609 Other forms of dyspnea: Secondary | ICD-10-CM | POA: Diagnosis not present

## 2016-10-23 DIAGNOSIS — I119 Hypertensive heart disease without heart failure: Secondary | ICD-10-CM | POA: Diagnosis not present

## 2016-10-23 DIAGNOSIS — I429 Cardiomyopathy, unspecified: Secondary | ICD-10-CM | POA: Diagnosis not present

## 2016-10-27 ENCOUNTER — Encounter: Payer: Self-pay | Admitting: Physical Medicine & Rehabilitation

## 2016-10-27 ENCOUNTER — Ambulatory Visit (HOSPITAL_BASED_OUTPATIENT_CLINIC_OR_DEPARTMENT_OTHER): Admitting: Physical Medicine & Rehabilitation

## 2016-10-27 ENCOUNTER — Other Ambulatory Visit: Payer: Self-pay | Admitting: Registered Nurse

## 2016-10-27 ENCOUNTER — Encounter: Attending: Registered Nurse

## 2016-10-27 VITALS — BP 123/69 | HR 99

## 2016-10-27 DIAGNOSIS — M533 Sacrococcygeal disorders, not elsewhere classified: Secondary | ICD-10-CM | POA: Diagnosis not present

## 2016-10-27 DIAGNOSIS — R296 Repeated falls: Secondary | ICD-10-CM | POA: Diagnosis not present

## 2016-10-27 DIAGNOSIS — M961 Postlaminectomy syndrome, not elsewhere classified: Secondary | ICD-10-CM | POA: Diagnosis not present

## 2016-10-27 DIAGNOSIS — M5136 Other intervertebral disc degeneration, lumbar region: Secondary | ICD-10-CM | POA: Diagnosis not present

## 2016-10-27 DIAGNOSIS — G8929 Other chronic pain: Secondary | ICD-10-CM | POA: Diagnosis not present

## 2016-10-27 DIAGNOSIS — E669 Obesity, unspecified: Secondary | ICD-10-CM | POA: Insufficient documentation

## 2016-10-27 DIAGNOSIS — R251 Tremor, unspecified: Secondary | ICD-10-CM | POA: Diagnosis not present

## 2016-10-27 DIAGNOSIS — I429 Cardiomyopathy, unspecified: Secondary | ICD-10-CM | POA: Diagnosis not present

## 2016-10-27 DIAGNOSIS — M7061 Trochanteric bursitis, right hip: Secondary | ICD-10-CM | POA: Diagnosis not present

## 2016-10-27 DIAGNOSIS — D649 Anemia, unspecified: Secondary | ICD-10-CM | POA: Diagnosis not present

## 2016-10-27 DIAGNOSIS — I110 Hypertensive heart disease with heart failure: Secondary | ICD-10-CM | POA: Insufficient documentation

## 2016-10-27 DIAGNOSIS — G4700 Insomnia, unspecified: Secondary | ICD-10-CM | POA: Diagnosis not present

## 2016-10-27 DIAGNOSIS — Z5181 Encounter for therapeutic drug level monitoring: Secondary | ICD-10-CM | POA: Insufficient documentation

## 2016-10-27 DIAGNOSIS — Z981 Arthrodesis status: Secondary | ICD-10-CM | POA: Diagnosis not present

## 2016-10-27 DIAGNOSIS — R29898 Other symptoms and signs involving the musculoskeletal system: Secondary | ICD-10-CM | POA: Insufficient documentation

## 2016-10-27 DIAGNOSIS — I509 Heart failure, unspecified: Secondary | ICD-10-CM | POA: Insufficient documentation

## 2016-10-27 DIAGNOSIS — M62838 Other muscle spasm: Secondary | ICD-10-CM | POA: Diagnosis not present

## 2016-10-27 DIAGNOSIS — G894 Chronic pain syndrome: Secondary | ICD-10-CM | POA: Insufficient documentation

## 2016-10-27 DIAGNOSIS — M5137 Other intervertebral disc degeneration, lumbosacral region: Secondary | ICD-10-CM | POA: Insufficient documentation

## 2016-10-27 DIAGNOSIS — R531 Weakness: Secondary | ICD-10-CM | POA: Insufficient documentation

## 2016-10-27 DIAGNOSIS — M5416 Radiculopathy, lumbar region: Secondary | ICD-10-CM | POA: Insufficient documentation

## 2016-10-27 DIAGNOSIS — Z90710 Acquired absence of both cervix and uterus: Secondary | ICD-10-CM | POA: Insufficient documentation

## 2016-10-27 DIAGNOSIS — Z955 Presence of coronary angioplasty implant and graft: Secondary | ICD-10-CM | POA: Insufficient documentation

## 2016-10-27 DIAGNOSIS — Z8249 Family history of ischemic heart disease and other diseases of the circulatory system: Secondary | ICD-10-CM | POA: Insufficient documentation

## 2016-10-27 DIAGNOSIS — Z79899 Other long term (current) drug therapy: Secondary | ICD-10-CM | POA: Diagnosis not present

## 2016-10-27 DIAGNOSIS — M25561 Pain in right knee: Secondary | ICD-10-CM | POA: Diagnosis not present

## 2016-10-27 DIAGNOSIS — R200 Anesthesia of skin: Secondary | ICD-10-CM | POA: Diagnosis not present

## 2016-10-27 MED ORDER — OXYCODONE HCL ER 40 MG PO T12A: 40.0000 mg | EXTENDED_RELEASE_TABLET | Freq: Two times a day (BID) | ORAL | 0 refills | Status: DC

## 2016-10-27 MED ORDER — OXYCODONE-ACETAMINOPHEN 5-325 MG PO TABS: 1.0000 | ORAL_TABLET | Freq: Two times a day (BID) | ORAL | 0 refills | Status: DC | PRN

## 2016-10-27 NOTE — Patient Instructions (Signed)

## 2016-10-27 NOTE — Progress Notes (Signed)
Subjective:    Patient ID: Molly Norton, female    DOB: 09-Oct-1965, 51 y.o.   MRN: [id number]  HPI Left chronic knee pain prior to back injury, feels like this is getting worse, tends to shift weight to the left side.  Has had cortisone and synvisc injections at [facility] to have chronic low back pain and Right lateral hip pain  Pt feels SI area overall doing better s/p fusion,   Trying to walk as much as possible but using walker for balance PT was ordered but not completed due to administrative issues Has not worked since sacroiliac fusion in December 2016. 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  Pain Inventory Average Pain 8 Pain Right Now 4 My pain is sharp, burning, stabbing and aching  In the last 24 hours, has pain interfered with the following? General activity 8 Relation with others 5 Enjoyment of life 5 What TIME of day is your pain at its worst? all Sleep (in general) Poor  Pain is worse with: walking, bending, sitting, inactivity, standing and some activites Pain improves with: rest, heat/ice, therapy/exercise, pacing activities, medication and injections Relief from Meds: 8  Mobility walk with assistance use a cane use a walker ability to climb steps?  yes do you drive?  yes use a wheelchair needs help with transfers  Function not employed: date last employed . I need assistance with the following:  bathing, toileting, meal prep and household duties  Neuro/Psych bladder control problems weakness numbness tingling trouble walking spasms  Prior Studies Any changes since last visit?  no  Physicians involved in your care Any changes since last visit?  no   Family History  Problem Relation Age of Onset  . Hypertension Mother   . Heart disease Father    Social History   Social History  . Marital status: Married    Spouse name: N/A  . Number of children: N/A  . Years of  education: N/A   Social History Main Topics  . Smoking status: Never Smoker  . Smokeless tobacco: Never Used  . Alcohol use Yes     Comment: rare  . Drug use: No  . Sexual activity: Not on file   Other Topics Concern  . Not on file   Social History Narrative   Separated, lives alone, marital stress in past.    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 ABDOMINAL HYSTERECTOMY    . ANTERIOR LUMBAR FUSION  12/19/2010   L4-5 and L5-S1  . BREAST LUMPECTOMY    . CESAREAN SECTION  1990  . COLONOSCOPY    . LAPAROSCOPIC OVARIAN CYSTECTOMY     post cyst rupture  . LEFT AND RIGHT HEART CATHETERIZATION WITH CORONARY ANGIOGRAM N/A 06/27/2014   Procedure: LEFT AND RIGHT HEART CATHETERIZATION WITH CORONARY ANGIOGRAM;  Surgeon: Corky Crafts, MD;  Location: [facility] CATH LAB;  Service: Cardiovascular;  Laterality: N/A;  . NASAL SINUS SURGERY    . SACROILIAC JOINT FUSION Right 05/17/2015   Procedure: SACROILIAC JOINT FUSION;  Surgeon: Estill Bamberg, MD;  Location: [facility] OR;  Service: Orthopedics;  Laterality: Right;  Right sided sacroiliac joint fusion  . WISDOM TOOTH EXTRACTION     Past Medical History:  Diagnosis Date  . Anemia   . Cardiomyopathy (HCC) 06/23/2014  . CHF (congestive heart failure) (HCC)   . Chronic lower back pain   .  Constipation   . DDD (degenerative disc disease)    at L4-5 and L5-S1  . Essential hypertension 06/23/2014  . Family history of adverse reaction to anesthesia    my daughter " came out in a rage."  . Obesity (BMI 30-39.9)   . PONV (postoperative nausea and vomiting)    There were no vitals taken for this visit.  Opioid Risk Score:   Fall Risk Score:  `1  Depression screen PHQ 2/9  Depression screen [facility] 2/9 04/01/2016 11/06/2015 01/23/2015 01/04/2015 12/26/2014 09/04/2014  Decreased Interest 0 0 2 3 3  0  Down, Depressed, Hopeless 0 0 2 3 3  0  PHQ - 2 Score 0 0 4 6 6  0  Altered sleeping - -  - 3 2 2   Tired, decreased energy - - - 1 1 2   Change in appetite - - - 0 0 0  Feeling bad or failure about yourself  - - - 0 0 0  Trouble concentrating - - - 0 0 0  Moving slowly or fidgety/restless - - - 0 0 0  Suicidal thoughts - - - 0 0 0  PHQ-9 Score - - - 10 9 4   Difficult doing work/chores - - - - Not difficult at all -    Review of Systems  Constitutional: Negative.   HENT: Negative.   Eyes: Negative.   Respiratory: Negative.   Cardiovascular: Negative.   Gastrointestinal: Positive for constipation.  Endocrine: Negative.   Genitourinary: Negative.   Musculoskeletal: Positive for joint swelling.  Skin: Negative.   Allergic/Immunologic: Negative.   Neurological: Negative.   Hematological: Negative.   Psychiatric/Behavioral: Negative.   All other systems reviewed and are negative.      Objective:   Physical Exam  Constitutional: She is oriented to person, place, and time. She appears well-developed and well-nourished.  HENT:  Head: Normocephalic and atraumatic.  Eyes: Conjunctivae and EOM are normal. Pupils are equal, round, and reactive to light.  Neck: Normal range of motion.  Musculoskeletal:       Right hip: She exhibits bony tenderness. She exhibits no deformity.  Neurological: She is alert and oriented to person, place, and time. She displays tremor. She displays no atrophy. A sensory deficit is present. She exhibits normal muscle tone. Gait abnormal.  Reflex Scores:      Patellar reflexes are 2+ on the right side and 2+ on the left side.      Achilles reflexes are 1+ on the right side and 1+ on the left side. Uses walker to ambulate. Tremor right lower extremity,   Psychiatric: She has a normal mood and affect.  Nursing note and vitals reviewed.  patient has guarding with lower extremity range of motion. She has normal knee and hip range of motion.     Sensory  Decreased R L4,5 S1        Assessment & Plan:  1. 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 We will continue the opioid monitoring program, this consists of regular clinic visits, examinations, urine drug screen, pill counts as well as use of [place] Controlled Substance Reporting System. Last UDS 08/19/2016 was appropriate  Follow-up nurse practitioner, 1 month  2. Lumbar Radicular Pain: Continue Gabapentin and Therma Care. 10/27/2016 3. Right sacroiliac disorder: S/P Sacroiliac Joint Fusion with Dr. Yevette Edwards on 05/17/2015 4.Insomnia: Continue:Ambien 10  mg one tablet at HS prn. 10/27/16 5. Muscle Spasm: Continue Zanaflex/ Baclofen/ Flexeril. 10/27/2016 6. Right Leg weakness/ Physical Deconditioning:RX: Referral to Physical Therapy.  Printed out new trochanteric bursitis HEP  7.  R hip troch bursitis   Right hip  Trochanteric bursa injection  without ultrasound guidance  Indication Trochanteric bursitis. Exam has tenderness over the greater trochanter of the hip. Pain has not responded to conservative care such as exercise therapy and oral medications. Pain interferes with sleep or with mobility Informed consent was obtained after describing risks and benefits of the procedure with the patient these include bleeding bruising and infection. Patient has signed written consent form. Patient placed in a lateral decubitus position with the affected hip superior. Point of maximal pain was palpated marked and prepped with Betadine and entered with a needle to bone contact. Needle slightly withdrawn then 6mg  of betamethasone with 4 cc 1% lidocaine were injected. Patient tolerated procedure well. Post procedure instructions given.

## 2016-10-28 ENCOUNTER — Ambulatory Visit: Payer: Self-pay | Admitting: Physical Medicine & Rehabilitation

## 2016-10-29 ENCOUNTER — Other Ambulatory Visit: Payer: Self-pay | Admitting: Obstetrics and Gynecology

## 2016-10-29 DIAGNOSIS — Z803 Family history of malignant neoplasm of breast: Secondary | ICD-10-CM

## 2016-11-06 ENCOUNTER — Ambulatory Visit
Admission: RE | Admit: 2016-11-06 | Discharge: 2016-11-06 | Disposition: A | Discharge status: Home / Self Care | Payer: Federal, State, Local not specified - PPO | Source: Ambulatory Visit | Attending: Obstetrics and Gynecology | Admitting: Obstetrics and Gynecology

## 2016-11-06 DIAGNOSIS — Z803 Family history of malignant neoplasm of breast: Secondary | ICD-10-CM

## 2016-11-06 MED ORDER — GADOBENATE DIMEGLUMINE 529 MG/ML IV SOLN
19.0000 mL | Freq: Once | INTRAVENOUS | Status: AC | PRN
  Administered 2016-11-06: 19 mL via INTRAVENOUS

## 2016-11-12 ENCOUNTER — Encounter: Payer: Self-pay | Admitting: Psychiatry

## 2016-11-12 ENCOUNTER — Telehealth: Payer: Self-pay | Admitting: Genetic Counselor

## 2016-11-12 DIAGNOSIS — N6311 Unspecified lump in the right breast, upper outer quadrant: Secondary | ICD-10-CM | POA: Diagnosis not present

## 2016-11-12 NOTE — Telephone Encounter (Signed)
Received a call from Dr. Cherlyn LabellaBertrand's office at [facility] to schedule the appt a genetic counseling appt. Appt has been scheduled for the pt to see karen on 7/9 at 1pm.

## 2016-11-13 ENCOUNTER — Other Ambulatory Visit: Payer: Self-pay

## 2016-11-17 DIAGNOSIS — D241 Benign neoplasm of right breast: Secondary | ICD-10-CM | POA: Diagnosis not present

## 2016-11-24 ENCOUNTER — Ambulatory Visit: Payer: Self-pay | Admitting: Registered Nurse

## 2016-11-28 ENCOUNTER — Other Ambulatory Visit: Payer: Self-pay | Admitting: Registered Nurse

## 2016-12-05 ENCOUNTER — Encounter: Payer: Federal, State, Local not specified - PPO | Attending: Physical Medicine & Rehabilitation

## 2016-12-05 ENCOUNTER — Encounter: Payer: Self-pay | Admitting: Physical Medicine & Rehabilitation

## 2016-12-05 ENCOUNTER — Ambulatory Visit (HOSPITAL_BASED_OUTPATIENT_CLINIC_OR_DEPARTMENT_OTHER): Payer: Federal, State, Local not specified - PPO | Admitting: Physical Medicine & Rehabilitation

## 2016-12-05 VITALS — BP 137/83 | HR 99 | Resp 14

## 2016-12-05 DIAGNOSIS — M7061 Trochanteric bursitis, right hip: Secondary | ICD-10-CM | POA: Diagnosis not present

## 2016-12-05 DIAGNOSIS — Z90710 Acquired absence of both cervix and uterus: Secondary | ICD-10-CM | POA: Diagnosis not present

## 2016-12-05 DIAGNOSIS — G8929 Other chronic pain: Secondary | ICD-10-CM | POA: Insufficient documentation

## 2016-12-05 DIAGNOSIS — Z8249 Family history of ischemic heart disease and other diseases of the circulatory system: Secondary | ICD-10-CM | POA: Diagnosis not present

## 2016-12-05 DIAGNOSIS — Z9889 Other specified postprocedural states: Secondary | ICD-10-CM | POA: Insufficient documentation

## 2016-12-05 DIAGNOSIS — I509 Heart failure, unspecified: Secondary | ICD-10-CM | POA: Diagnosis not present

## 2016-12-05 DIAGNOSIS — M533 Sacrococcygeal disorders, not elsewhere classified: Secondary | ICD-10-CM

## 2016-12-05 DIAGNOSIS — G894 Chronic pain syndrome: Secondary | ICD-10-CM

## 2016-12-05 DIAGNOSIS — F1120 Opioid dependence, uncomplicated: Secondary | ICD-10-CM | POA: Insufficient documentation

## 2016-12-05 DIAGNOSIS — M961 Postlaminectomy syndrome, not elsewhere classified: Secondary | ICD-10-CM

## 2016-12-05 DIAGNOSIS — M545 Low back pain: Secondary | ICD-10-CM | POA: Insufficient documentation

## 2016-12-05 DIAGNOSIS — Z79899 Other long term (current) drug therapy: Secondary | ICD-10-CM | POA: Diagnosis not present

## 2016-12-05 DIAGNOSIS — Z5181 Encounter for therapeutic drug level monitoring: Secondary | ICD-10-CM

## 2016-12-05 MED ORDER — OXYCODONE-ACETAMINOPHEN 5-325 MG PO TABS: 1.0000 | ORAL_TABLET | Freq: Two times a day (BID) | ORAL | 0 refills | Status: DC | PRN

## 2016-12-05 MED ORDER — OXYCODONE HCL ER 40 MG PO T12A: 40.0000 mg | EXTENDED_RELEASE_TABLET | Freq: Two times a day (BID) | ORAL | 0 refills | Status: DC

## 2016-12-05 NOTE — Patient Instructions (Signed)
Please resume your medications  We'll do swab rather than urine test.

## 2016-12-05 NOTE — Progress Notes (Signed)
Subjective:    Patient ID: Molly Norton, female    DOB: [birth year], 51 y.o.   MRN: [id number]  HPI Right hip injection last month helped for 3 wks  Left SI joint starting to hurt  Was out of town for the last visit  When meds ran out last week had a bad headache,  As well as more pain in back and hip  Pain score last visit 4/10 on meds  Pain Inventory Average Pain 6 Pain Right Now 9 My pain is constant, sharp, burning, stabbing, tingling and aching  In the last 24 hours, has pain interfered with the following? General activity 10 Relation with others 10 Enjoyment of life 9 What TIME of day is your pain at its worst? morning, evening, night Sleep (in general) Poor  Pain is worse with: walking, bending, sitting, inactivity, standing and some activites Pain improves with: rest, heat/ice, therapy/exercise, pacing activities, medication and injections Relief from Meds: 7  Mobility walk with assistance use a cane use a walker how many minutes can you walk? 5 ability to climb steps?  no do you drive?  no use a wheelchair needs help with transfers Do you have any goals in this area?  yes  Function not employed: date last employed . Do you have any goals in this area?  yes  Neuro/Psych bladder control problems weakness numbness tremor tingling trouble walking spasms  Prior Studies Any changes since last visit?  no  Physicians involved in your care Any changes since last visit?  no   Family History  Problem Relation Age of Onset  . Hypertension Mother   . Heart disease Father    Social History   Social History  . Marital status: Married    Spouse name: N/A  . Number of children: N/A  . Years of education: N/A   Social History Main Topics  . Smoking status: Never Smoker  . Smokeless tobacco: Never Used  . Alcohol use Yes     Comment: rare  . Drug use: No  . Sexual activity: Not Asked   Other Topics Concern  . None   Social History  Narrative   Separated, lives alone, marital stress in past.    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 ABDOMINAL HYSTERECTOMY    . ANTERIOR LUMBAR FUSION  12/19/2010   L4-5 and L5-S1  . BREAST LUMPECTOMY    . CESAREAN SECTION  1990  . COLONOSCOPY    . LAPAROSCOPIC OVARIAN CYSTECTOMY     post cyst rupture  . LEFT AND RIGHT HEART CATHETERIZATION WITH CORONARY ANGIOGRAM N/A 06/27/2014   Procedure: LEFT AND RIGHT HEART CATHETERIZATION WITH CORONARY ANGIOGRAM;  Surgeon: Corky Crafts, MD;  Location: [facility] CATH LAB;  Service: Cardiovascular;  Laterality: N/A;  . NASAL SINUS SURGERY    . SACROILIAC JOINT FUSION Right 05/17/2015   Procedure: SACROILIAC JOINT FUSION;  Surgeon: Estill Bamberg, MD;  Location: [facility] OR;  Service: Orthopedics;  Laterality: Right;  Right sided sacroiliac joint fusion  . WISDOM TOOTH EXTRACTION     Past Medical History:  Diagnosis Date  . Anemia   . Cardiomyopathy (HCC) 06/23/2014  . CHF (congestive heart failure) (HCC)   . Chronic lower back pain   . Constipation   . DDD (degenerative disc disease)    at L4-5 and L5-S1  . Essential hypertension 06/23/2014  . Family history of adverse reaction to anesthesia  my daughter " came out in a rage."  . Obesity (BMI 30-39.9)   . PONV (postoperative nausea and vomiting)    BP 137/83 (BP Location: Left Arm, Patient Position: Sitting, Cuff Size: Large)   Pulse 99   Resp 14   SpO2 98%   Opioid Risk Score:   Fall Risk Score:  `1  Depression screen PHQ 2/9  [facility]/9 04/01/2016 11/06/2015 01/23/2015 01/04/2015 12/26/2014 09/04/2014  Decreased Interest 0 0 2 3 3  0  Down, Depressed, Hopeless 0 0 2 3 3  0  PHQ - 2 Score 0 0 4 6 6  0  Altered sleeping - - - 3 2 2   Tired, decreased energy - - - 1 1 2   Change in appetite - - - 0 0 0  Feeling bad or failure about yourself  - - - 0 0 0  Trouble concentrating - - - 0 0 0  Moving slowly or  fidgety/restless - - - 0 0 0  Suicidal thoughts - - - 0 0 0  PHQ-9 Score - - - 10 9 4   Difficult doing work/chores - - - - Not difficult at all -    Review of Systems  HENT: Negative.   Eyes: Negative.   Respiratory: Negative.   Cardiovascular: Negative.   Gastrointestinal: Negative.   Endocrine: Negative.   Genitourinary: Negative.   Musculoskeletal: Positive for arthralgias, back pain, gait problem and myalgias.       Spasms  Skin: Negative.   Allergic/Immunologic: Negative.   Neurological: Positive for tremors, weakness and numbness.       Tingling   Hematological: Negative.   Psychiatric/Behavioral: Negative.   All other systems reviewed and are negative.      Objective:   Physical Exam  Constitutional: She is oriented to person, place, and time.  Neurological: She is alert and oriented to person, place, and time.  Motor strength is 5/5 bilateral deltoid, triceps, grip 4/5 in the right hip flexor, knee extensor, ankle dorsiflexor, 5 minus in the left hip flexor, knee extensor, ankle dorsiflexor. Tenderness to palpation over the right greater trochanter as well as the PSIS bilaterally. Mood and affect are appropriate    Decreased Right L4 and L5 sensation  DTRs 1+ B patella and 2+ bilateral knee       Assessment & Plan:  1. Lumbar postlaminectomy syndrome, status post 360 fusion with chronic low back pain and chronic right lower extremity pain.  2. History of sacroiliac dysfunction on the right side, status post fusion may be developing some symptoms on left side  3. Trochanteric bursitis, right side 3 week relief after injection. 2. Early to reinject at the current time  4. Chronic pain, chronic opiate dependence, she did not have much in terms of withdrawal effect after stopping her medications after running out. Will do swab.  NP visit One month.  UDS should not have any sign of opioids  given that the patient ran out one week ago per her report today

## 2016-12-12 DIAGNOSIS — R351 Nocturia: Secondary | ICD-10-CM | POA: Diagnosis not present

## 2016-12-12 DIAGNOSIS — R350 Frequency of micturition: Secondary | ICD-10-CM | POA: Diagnosis not present

## 2016-12-12 DIAGNOSIS — N3946 Mixed incontinence: Secondary | ICD-10-CM | POA: Diagnosis not present

## 2016-12-12 LAB — DRUG TOX MONITOR 1 W/CONF, ORAL FLD
Barbiturates: NEGATIVE ng/mL (ref <10)
Benzodiazepines: NEGATIVE ng/mL (ref <0.50)
CODEINE: NEGATIVE ng/mL (ref <2.5)
Cocaine: NEGATIVE ng/mL (ref <2.5)
Dihydrocodeine: NEGATIVE ng/mL (ref <2.5)
Fentanyl: NEGATIVE ng/mL (ref <0.10)
HEROIN METABOLITE: NEGATIVE ng/mL (ref <1.0)
HYDROCODONE: NEGATIVE ng/mL (ref <2.5)
HYDROMORPHONE: NEGATIVE ng/mL (ref <2.5)
MARIJUANA: NEGATIVE ng/mL (ref <2.5)
MDMA: NEGATIVE ng/mL (ref <10)
METHADONE: NEGATIVE ng/mL (ref <5.0)
MORPHINE: NEGATIVE ng/mL (ref <2.5)
Meprobamate: NEGATIVE ng/mL (ref <2.5)
NICOTINE METABOLITE: NEGATIVE ng/mL (ref <5.0)
OXYCODONE: NEGATIVE ng/mL (ref <2.5)
Oxymorphone: NEGATIVE ng/mL (ref <2.5)
Phencyclidine: NEGATIVE ng/mL (ref <10)
Tapentadol: NEGATIVE ng/mL (ref <5.0)
Tramadol: NEGATIVE ng/mL (ref <5.0)
Zolpidem: NEGATIVE ng/mL (ref <5.0)

## 2016-12-12 LAB — DRUG TOX ALC METAB W/CON, ORAL FLD: Alcohol Metabolite: NEGATIVE ng/mL (ref <25)

## 2016-12-15 ENCOUNTER — Ambulatory Visit (HOSPITAL_BASED_OUTPATIENT_CLINIC_OR_DEPARTMENT_OTHER): Payer: Federal, State, Local not specified - PPO | Admitting: Genetic Counselor

## 2016-12-15 ENCOUNTER — Other Ambulatory Visit: Payer: Federal, State, Local not specified - PPO

## 2016-12-15 ENCOUNTER — Encounter: Payer: Self-pay | Admitting: Genetic Counselor

## 2016-12-15 DIAGNOSIS — Z800 Family history of malignant neoplasm of digestive organs: Secondary | ICD-10-CM | POA: Insufficient documentation

## 2016-12-15 DIAGNOSIS — Z803 Family history of malignant neoplasm of breast: Secondary | ICD-10-CM

## 2016-12-15 DIAGNOSIS — Z7183 Encounter for nonprocreative genetic counseling: Secondary | ICD-10-CM

## 2016-12-15 NOTE — Progress Notes (Signed)
REFERRING PROVIDER: Lorelle Gibbs, MD [facility] [address] [place], [place] [address]  PRIMARY PROVIDER:  Patient, No Pcp Per  PRIMARY REASON FOR VISIT:  1. Family history of breast cancer   2. Family history of colon cancer      HISTORY OF PRESENT ILLNESS:   Ms. Depner, a 51 y.o. female, was seen for a [place] cancer genetics consultation at the request of Dr. Isaiah Blakes due to a family history of cancer.  Ms. Rippetoe presents to clinic today to discuss the possibility of a hereditary predisposition to cancer, genetic testing, and to further clarify her future cancer risks, as well as potential cancer risks for family members.  Ms. Hershkowitz is a 51 y.o. female with no personal history of cancer.  Her recent mammogram found several areas that were suspicious so she had an ultrasound, MRI and two breast biopsies.  These areas were determined to be benign.    CANCER HISTORY:   No history exists.     HORMONAL RISK FACTORS:  Menarche was at age 13.  First live birth at age 38.  OCP use for approximately 0 years.  Ovaries intact: yes.  Hysterectomy: yes.  Menopausal status: postmenopausal.  HRT use: 0 years. Colonoscopy: yes; normal. Mammogram within the last year: yes. Number of breast biopsies: 2. Up to date with pelvic exams:  yes. Any excessive radiation exposure in the past:  no  Past Medical History:  Diagnosis Date  . Anemia   . Cardiomyopathy ([place]) 06/23/2014  . CHF (congestive heart failure) ([place])   . Chronic lower back pain   . Constipation   . DDD (degenerative disc disease)    at L4-5 and L5-S1  . Essential hypertension 06/23/2014  . Family history of adverse reaction to anesthesia    my daughter " came out in a rage."  . Family history of breast cancer   . Family history of colon cancer   . Obesity (BMI 30-39.9)   . PONV (postoperative nausea and vomiting)     Past Surgical History:  Procedure Laterality Date    . ABDOMINAL HYSTERECTOMY    . ANTERIOR LUMBAR FUSION  12/19/2010   L4-5 and L5-S1  . BREAST LUMPECTOMY    . CESAREAN SECTION  1990  . COLONOSCOPY    . LAPAROSCOPIC OVARIAN CYSTECTOMY     post cyst rupture  . LEFT AND RIGHT HEART CATHETERIZATION WITH CORONARY ANGIOGRAM N/A 06/27/2014   Procedure: LEFT AND RIGHT HEART CATHETERIZATION WITH CORONARY ANGIOGRAM;  Surgeon: Jettie Booze, MD;  Location: [facility] CATH LAB;  Service: Cardiovascular;  Laterality: N/A;  . NASAL SINUS SURGERY    . SACROILIAC JOINT FUSION Right 05/17/2015   Procedure: SACROILIAC JOINT FUSION;  Surgeon: Phylliss Bob, MD;  Location: [place];  Service: Orthopedics;  Laterality: Right;  Right sided sacroiliac joint fusion  . WISDOM TOOTH EXTRACTION      Social History   Social History  . Marital status: Married    Spouse name: N/A  . Number of children: N/A  . Years of education: N/A   Social History Main Topics  . Smoking status: Never Smoker  . Smokeless tobacco: Never Used  . Alcohol use Yes     Comment: rare  . Drug use: No  . Sexual activity: Not Asked   Other Topics Concern  . None   Social History Narrative   Separated, lives alone, marital stress in past.    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  Significant diagnoses are listed below: Family History  Problem Relation Age of Onset  . Hypertension Mother   . Heart attack Mother 76  . Heart disease Father   . Colon cancer Brother 32  . Breast cancer Maternal Aunt        dx under 45  . Breast cancer Paternal Aunt        dx under 50  . Colon cancer Paternal Uncle        dx over 11  . Colon cancer Paternal Grandmother        dx over 28  . Breast cancer Maternal Aunt 62  . Colon cancer Paternal Aunt        dx over 4  . Breast cancer Paternal Aunt        dx over 9    The patient has one daughter who is cancer free.  She has one sister and three  brothers.  Her sister had a prophylactic mastectomy a couple years ago based on the family history.  One brother was diagnosed with colon cancer at age 28.  Both parents are deceased. Her mother passed away from a heart attack at 40 and her father died from complications of a fall at age 21.  Her mother had tow sisters who both had breast cancer, one under 35 and the other at age 74.  The patient's maternal grandmother is alive at 41.  She had a mass in her abdomen in her 60's that was removed, but the patient does not think it was cancer.  Her maternal grandfather died before she was born.  The patient's father had 12 siblings.  One sister had breast cancer under 50 and another had breast cancer over 50.  One aunt had colon cancer over 26 and an uncle had colon cancer over 19.  Her father's mother died of colon cancer over the age of 56, while the patient's grandfather died of old age.  Ms. Otte is unaware of previous family history of genetic testing for hereditary cancer risks. Patient's maternal ancestors are of [place] American, Caucasian and [place] descent, and paternal ancestors are of [place] and Spanish descent. There is no reported Ashkenazi Jewish ancestry. There is no known consanguinity.  GENETIC COUNSELING ASSESSMENT: MARABELLE CUSHMAN is a 51 y.o. female with a family history of colon and breast cancer which is somewhat suggestive of a hereditary cancer syndrome and predisposition to cancer. We, therefore, discussed and recommended the following at today's visit.   DISCUSSION: We discussed that about 5-10% of breast cancer cases are due to hereditary causes, most commonly BRCA.  Other genes associated with an increased risk for breast cancer include ATM, CHEK2 and PALB2.  About 5-6% of colon cancer cases are hereditary with most cases due to Lynch syndrome.  Interestingly, some families with Lynch syndrome are primarily breast cancer families.  Therefore we should consider  testing for both colon and breast cancer syndromes based on the family history.  We reviewed the characteristics, features and inheritance patterns of hereditary cancer syndromes. We also discussed genetic testing, including the appropriate family members to test, the process of testing, insurance coverage and turn-around-time for results. We discussed the implications of a negative, positive and/or variant of uncertain significant result. We recommended Ms. Camire pursue genetic testing for the Common Hereditary gene panel. The Hereditary Gene Panel offered by [facility] includes sequencing and/or deletion  duplication testing of the following 46 genes: APC, ATM, AXIN2, BARD1, BMPR1A, BRCA1, BRCA2, BRIP1, CDH1, CDKN2A (p14ARF), CDKN2A (p16INK4a), CHEK2, CTNNA1, DICER1, EPCAM (Deletion/duplication testing only), GREM1 (promoter region deletion/duplication testing only), KIT, MEN1, MLH1, MSH2, MSH3, MSH6, MUTYH, NBN, NF1, NHTL1, PALB2, PDGFRA, PMS2, POLD1, POLE, PTEN, RAD50, RAD51C, RAD51D, SDHB, SDHC, SDHD, SMAD4, SMARCA4. STK11, TP53, TSC1, TSC2, and VHL.  The following genes were evaluated for sequence changes only: SDHA and HOXB13 c.251G>A variant only.  Based on Ms. Wierenga's family history of cancer, she meets medical criteria for genetic testing. Despite that she meets criteria, she may still have an out of pocket cost. We discussed that if her out of pocket cost for testing is over $100, the laboratory will call and confirm whether she wants to proceed with testing.  If the out of pocket cost of testing is less than $100 she will be billed by the genetic testing laboratory.   In order to estimate her chance of having a BRCA mutation, we used statistical models ([employer]) and laboratory data that take into account her personal medical history, family history and ancestry.  Because each model is different, there can be a lot of variability in the risks they give.  Therefore, these numbers must  be considered a rough range and not a precise risk of having a BRCA mutation.  These models estimate that she has approximately a 0.77-3.0% chance of having a mutation.      Based on the patient's personal and family history, statistical models (Tyrer Cusik)  and literature data were used to estimate her risk of developing breast cancer. These estimate her lifetime risk of developing breast cancer to be approximately 26.7%. This estimation does not take into account any genetic testing results.  The patient's lifetime breast cancer risk is a preliminary estimate based on available information using one of several models endorsed by the [place] (ACS). The ACS recommends consideration of breast MRI screening as an adjunct to mammography for patients at high risk (defined as 20% or greater lifetime risk). A more detailed breast cancer risk assessment can be considered, if clinically indicated.   Ms. Pates has been determined to be at high risk for breast cancer.  Therefore, we recommend that annual screening with mammography and breast MRI begin at age 73, or 10 years prior to the age of breast cancer diagnosis in a relative (whichever is earlier).  We discussed that Ms. Justice should discuss her individual situation with her referring physician and determine a breast cancer screening plan with which they are both comfortable.    In order to estimate her chance of having a MMR mutation associated with Lynch syndrome, we used statistical models (PREM1,2,6) and laboratory data that take into account her personal medical history, family history and ancestry.  Because each model is different, there can be a lot of variability in the risks they give.  Therefore, these numbers must be considered a rough range and not a precise risk of having a MMR mutation.  This model estimates that she has approximately a 2.6% chance of having a mutation. Based on this assessment of her family and personal  history, genetic testing is recommended.    PLAN: After considering the risks, benefits, and limitations, Ms. Yager  provided informed consent to pursue genetic testing and the blood sample was sent to [facility] for analysis of the Common Hereditary cancer panel. Results should be available within approximately 2-3 weeks' time, at which  point they will be disclosed by telephone to Ms. Henkes, as will any additional recommendations warranted by these results. Ms. Calk will receive a summary of her genetic counseling visit and a copy of her results once available. This information will also be available in Epic. We encouraged Ms. Viverette to remain in contact with cancer genetics annually so that we can continuously update the family history and inform her of any changes in cancer genetics and testing that may be of benefit for her family. Ms. Corprew's questions were answered to her satisfaction today. Our contact information was provided should additional questions or concerns arise.  Lastly, we encouraged Ms. Courtois to remain in contact with cancer genetics annually so that we can continuously update the family history and inform her of any changes in cancer genetics and testing that may be of benefit for this family.   Ms.  Akey's questions were answered to her satisfaction today. Our contact information was provided should additional questions or concerns arise. Thank you for the referral and allowing [place] to share in the care of your patient.   Karen P. Florene Glen, [place], [facility] Certified Genetic Counselor [email or web address] phone: [phone]  The patient was seen for a total of 50 minutes in face-to-face genetic counseling.  This patient was discussed with Drs. Magrinat, Lindi Adie and/or Burr Medico who agrees with the above.    _______________________________________________________________________ For Office Staff:  Number of people involved in session: 1 Was an Intern/  student involved with case: no

## 2016-12-16 ENCOUNTER — Telehealth: Payer: Self-pay | Admitting: *Deleted

## 2016-12-16 NOTE — Telephone Encounter (Signed)
Oral drug screen for this encounter is consistent.  She had not had her medication since 11/28/16 and test done on 12/05/16, therefore its absence is expected.

## 2016-12-26 ENCOUNTER — Encounter: Payer: Self-pay | Admitting: Genetic Counselor

## 2016-12-26 ENCOUNTER — Telehealth: Payer: Self-pay | Admitting: Genetic Counselor

## 2016-12-26 DIAGNOSIS — Z1379 Encounter for other screening for genetic and chromosomal anomalies: Secondary | ICD-10-CM | POA: Insufficient documentation

## 2016-12-26 NOTE — Telephone Encounter (Signed)
Mailbox is full and cannot accept any messages.

## 2016-12-28 IMAGING — RF DG SI JOINTS 3+V
1 series · 3 of 3 positions shown · non-contrast
Comparison: None.

CLINICAL DATA: Right side SI joint fusion. Main [id number]-[id number]. Fluoro time:
1 minute, 18 seconds.

EXAM:
DG C-ARM 61-120 MIN; BILATERAL SACROILIAC JOINTS - 3+ VIEW

[Series 1: run · 3 of 3 slices shown]
[im 1/3]
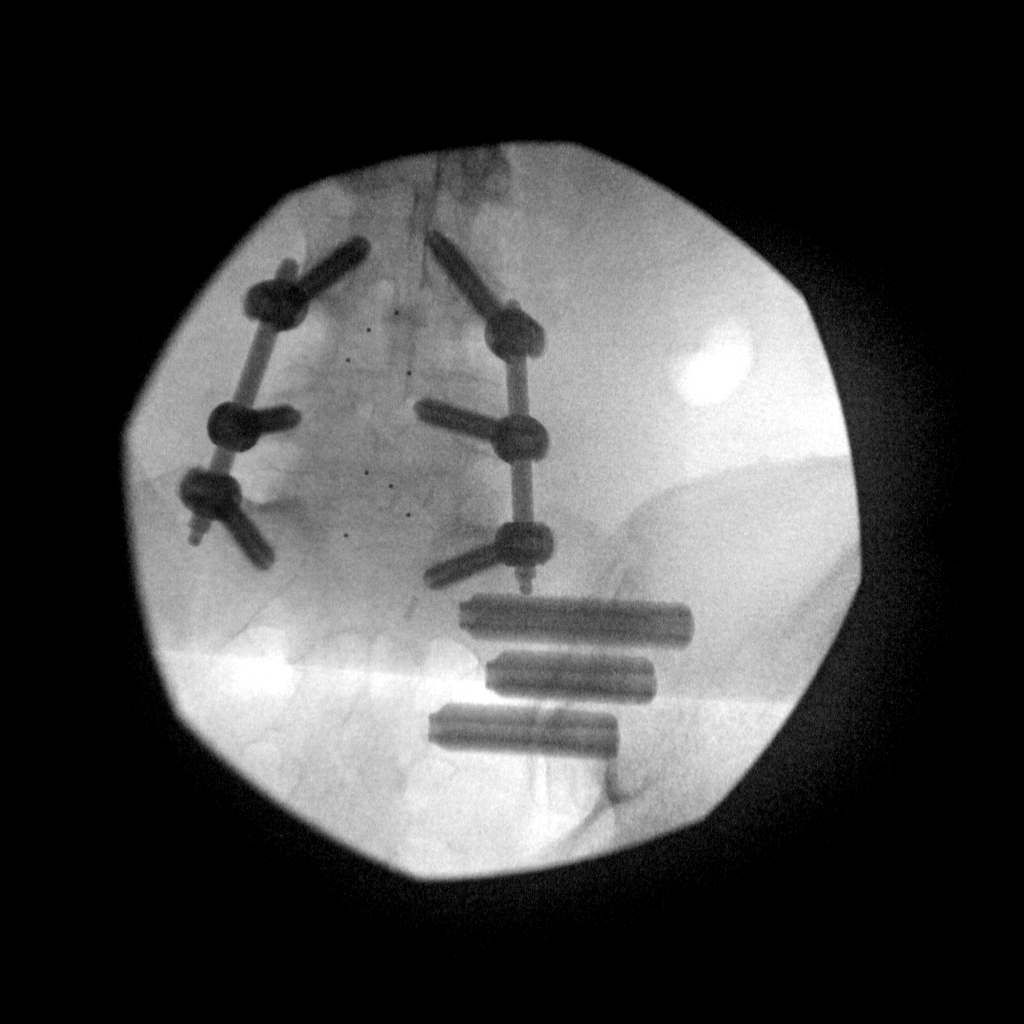
[im 2/3]
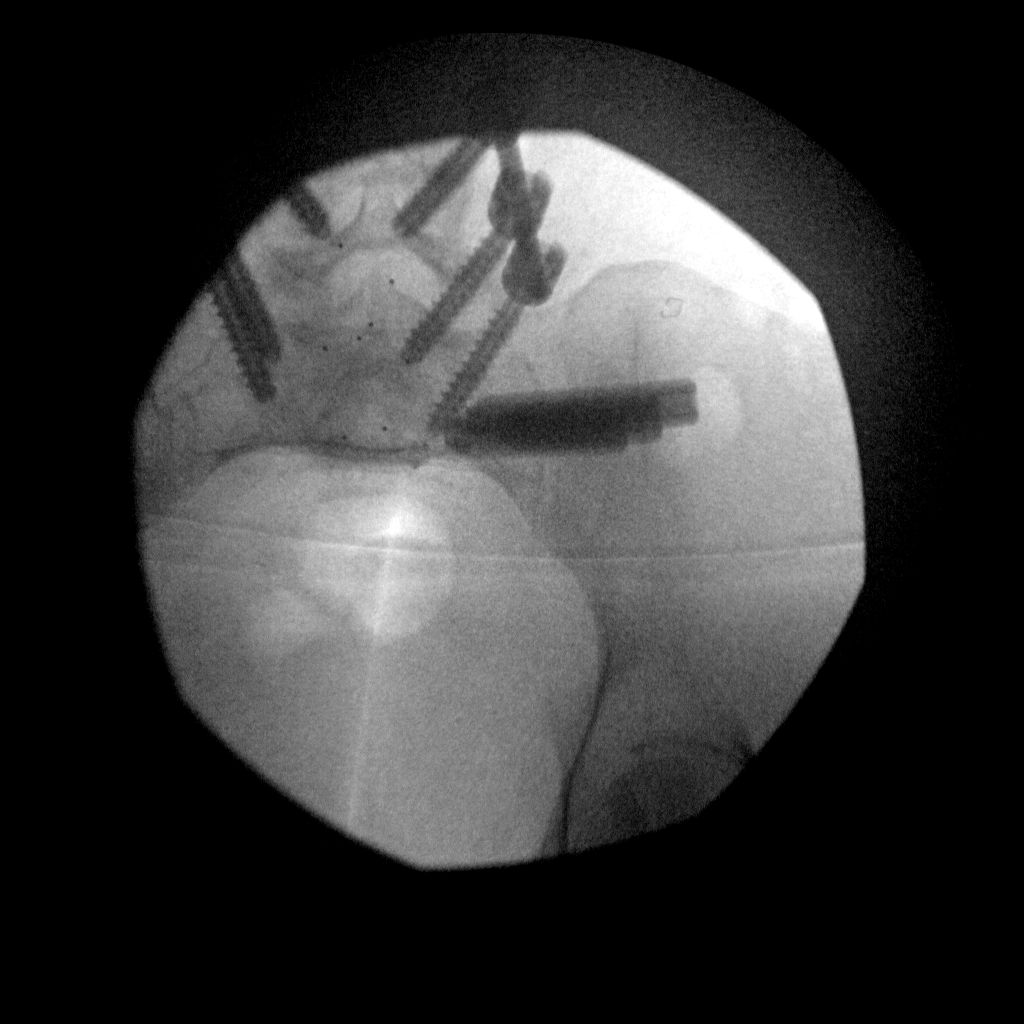
[im 3/3]
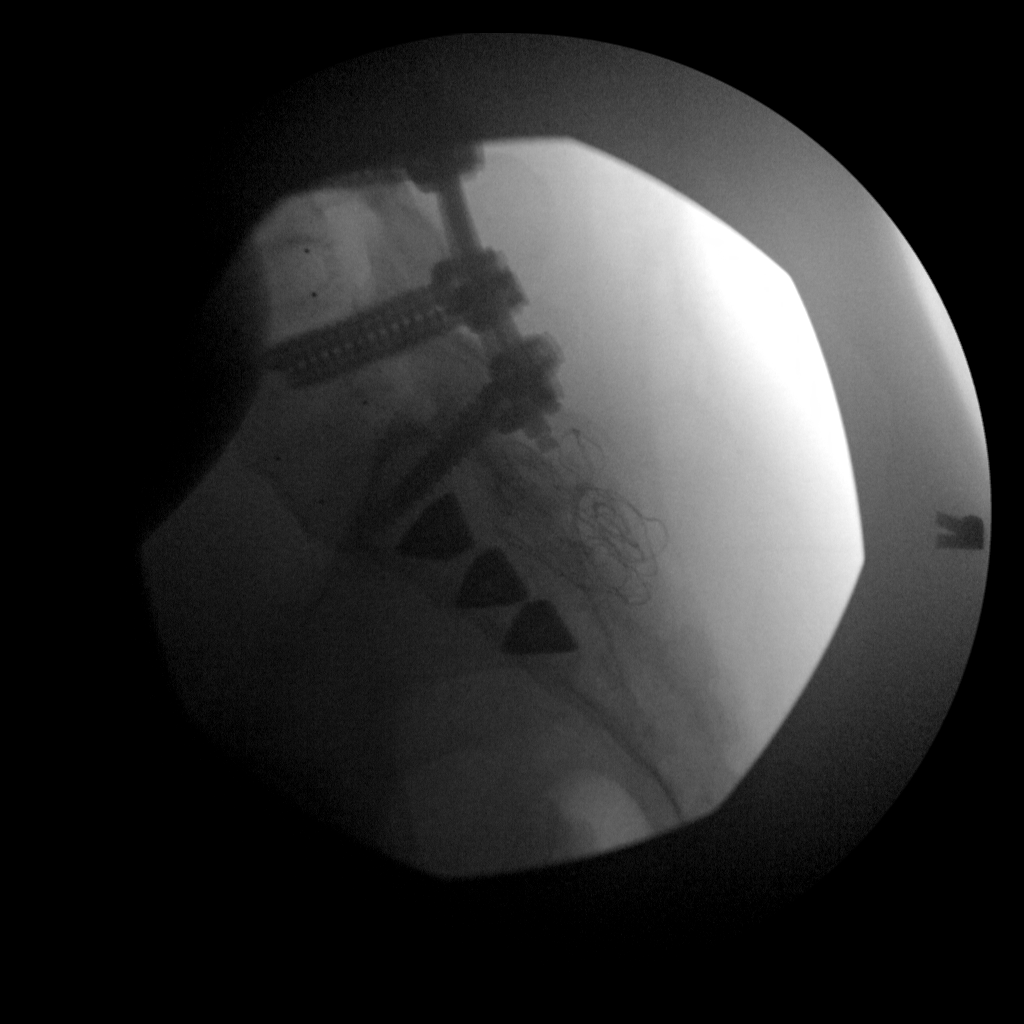

[3 of 3 positions shown; findings below may reference images not displayed]

FINDINGS: Three images are performed, showing 3 screws traversing the right SI
joint. There are transpedicular screws at L4, L5, and S1. There has
been posterior fusion with interbody fusion devices at the same
levels.
IMPRESSION: 1. Posterior fusion L4-S1.
2. Right SI joint fusion.

## 2016-12-29 ENCOUNTER — Telehealth: Payer: Self-pay | Admitting: Genetic Counselor

## 2016-12-29 NOTE — Telephone Encounter (Signed)
Mailbox is full and cannot accept any messages at this time.

## 2017-01-01 ENCOUNTER — Encounter: Payer: Self-pay | Admitting: Genetic Counselor

## 2017-01-01 ENCOUNTER — Telehealth: Payer: Self-pay | Admitting: Genetic Counselor

## 2017-01-01 ENCOUNTER — Encounter: Attending: Registered Nurse | Admitting: Registered Nurse

## 2017-01-01 ENCOUNTER — Encounter: Payer: Self-pay | Admitting: Registered Nurse

## 2017-01-01 VITALS — BP 158/107 | HR 100 | Resp 14

## 2017-01-01 DIAGNOSIS — M5416 Radiculopathy, lumbar region: Secondary | ICD-10-CM | POA: Diagnosis not present

## 2017-01-01 DIAGNOSIS — I509 Heart failure, unspecified: Secondary | ICD-10-CM | POA: Diagnosis not present

## 2017-01-01 DIAGNOSIS — R29898 Other symptoms and signs involving the musculoskeletal system: Secondary | ICD-10-CM

## 2017-01-01 DIAGNOSIS — Z79899 Other long term (current) drug therapy: Secondary | ICD-10-CM | POA: Diagnosis not present

## 2017-01-01 DIAGNOSIS — M533 Sacrococcygeal disorders, not elsewhere classified: Secondary | ICD-10-CM | POA: Diagnosis not present

## 2017-01-01 DIAGNOSIS — G4709 Other insomnia: Secondary | ICD-10-CM | POA: Diagnosis not present

## 2017-01-01 DIAGNOSIS — G8929 Other chronic pain: Secondary | ICD-10-CM | POA: Insufficient documentation

## 2017-01-01 DIAGNOSIS — M62838 Other muscle spasm: Secondary | ICD-10-CM | POA: Diagnosis not present

## 2017-01-01 DIAGNOSIS — Z8249 Family history of ischemic heart disease and other diseases of the circulatory system: Secondary | ICD-10-CM | POA: Diagnosis not present

## 2017-01-01 DIAGNOSIS — Z9889 Other specified postprocedural states: Secondary | ICD-10-CM | POA: Diagnosis not present

## 2017-01-01 DIAGNOSIS — G894 Chronic pain syndrome: Secondary | ICD-10-CM | POA: Diagnosis not present

## 2017-01-01 DIAGNOSIS — Z90710 Acquired absence of both cervix and uterus: Secondary | ICD-10-CM | POA: Insufficient documentation

## 2017-01-01 DIAGNOSIS — M545 Low back pain: Secondary | ICD-10-CM | POA: Insufficient documentation

## 2017-01-01 DIAGNOSIS — M7061 Trochanteric bursitis, right hip: Secondary | ICD-10-CM | POA: Insufficient documentation

## 2017-01-01 DIAGNOSIS — F1120 Opioid dependence, uncomplicated: Secondary | ICD-10-CM | POA: Diagnosis not present

## 2017-01-01 DIAGNOSIS — Z5181 Encounter for therapeutic drug level monitoring: Secondary | ICD-10-CM

## 2017-01-01 DIAGNOSIS — M961 Postlaminectomy syndrome, not elsewhere classified: Secondary | ICD-10-CM | POA: Insufficient documentation

## 2017-01-01 MED ORDER — TIZANIDINE HCL 4 MG PO CAPS: 4.0000 mg | ORAL_CAPSULE | Freq: Three times a day (TID) | ORAL | 2 refills | Status: DC | PRN

## 2017-01-01 MED ORDER — OXYCODONE-ACETAMINOPHEN 5-325 MG PO TABS: 1.0000 | ORAL_TABLET | Freq: Two times a day (BID) | ORAL | 0 refills | Status: DC | PRN

## 2017-01-01 MED ORDER — ZOLPIDEM TARTRATE 10 MG PO TABS: 10.0000 mg | ORAL_TABLET | Freq: Every evening | ORAL | 2 refills | Status: DC | PRN

## 2017-01-01 MED ORDER — OXYCODONE HCL ER 40 MG PO T12A: 40.0000 mg | EXTENDED_RELEASE_TABLET | Freq: Two times a day (BID) | ORAL | 0 refills | Status: DC

## 2017-01-01 NOTE — Telephone Encounter (Signed)
Mail box is full and cannot accept messages at this time.

## 2017-01-01 NOTE — Progress Notes (Signed)
Subjective:    Patient ID: Molly Norton, female    DOB: 06/28/1965, 51 y.o.   MRN: [id number]  HPI:  [name] is a 51 year old female who returnsfor follow up appointment and medication refill. She states her pain is located in her lower back radiating into her right groin, right hip, right lower extremity laterally. She rates her pain 8.Her current exercise regime is walking in her home with her walker.  Her last oral swab was on  was on 12/05/2016, it was consistent. Oral swab ordered today.   Pain Inventory Average Pain 7 Pain Right Now 8 My pain is sharp, burning, dull, stabbing, tingling and aching  In the last 24 hours, has pain interfered with the following? General activity 7 Relation with others 7 Enjoyment of life 7 What TIME of day is your pain at its worst? morning, evening, night Sleep (in general) Poor  Pain is worse with: walking, bending, sitting, inactivity, standing and some activites Pain improves with: rest, heat/ice, therapy/exercise, pacing activities and medication Relief from Meds: 6  Mobility walk with assistance use a cane use a walker how many minutes can you walk? 10 ability to climb steps?  no do you drive?  no use a wheelchair needs help with transfers Do you have any goals in this area?  yes  Function not employed: date last employed .  Neuro/Psych bladder control problems weakness numbness tremor tingling trouble walking spasms  Prior Studies Any changes since last visit?  no  Physicians involved in your care Any changes since last visit?  no   Family History  Problem Relation Age of Onset  . Hypertension Mother   . Heart attack Mother 89  . Heart disease Father   . Colon cancer Brother 72  . Breast cancer Maternal Aunt        dx under 45  . Breast cancer Paternal Aunt        dx under 50  . Colon cancer Paternal Uncle        dx over 57  . Colon cancer Paternal Grandmother        dx over 62    . Breast cancer Maternal Aunt 62  . Colon cancer Paternal Aunt        dx over 42  . Breast cancer Paternal Aunt        dx over 58   Social History   Social History  . Marital status: Married    Spouse name: N/A  . Number of children: N/A  . Years of education: N/A   Social History Main Topics  . Smoking status: Never Smoker  . Smokeless tobacco: Never Used  . Alcohol use Yes     Comment: rare  . Drug use: No  . Sexual activity: Not Asked   Other Topics Concern  . None   Social History Narrative   Separated, lives alone, marital stress in past.    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 ABDOMINAL HYSTERECTOMY    . ANTERIOR LUMBAR FUSION  12/19/2010   L4-5 and L5-S1  . BREAST LUMPECTOMY    . CESAREAN SECTION  1990  . COLONOSCOPY    . LAPAROSCOPIC OVARIAN CYSTECTOMY     post cyst rupture  . LEFT AND RIGHT HEART CATHETERIZATION WITH CORONARY ANGIOGRAM N/A 06/27/2014   Procedure: LEFT AND RIGHT HEART CATHETERIZATION WITH CORONARY ANGIOGRAM;  Surgeon: Donnie Coffin  Eldridge DaceVaranasi, MD;  [facility];  Service: Cardiovascular;  Laterality: N/A;  . NASAL SINUS SURGERY    . SACROILIAC JOINT FUSION Right 05/17/2015   Procedure: SACROILIAC JOINT FUSION;  Surgeon: Estill BambergMark Dumonski, MD;  [facility];  Service: Orthopedics;  Laterality: Right;  Right sided sacroiliac joint fusion  . WISDOM TOOTH EXTRACTION     Past Medical History:  Diagnosis Date  . Anemia   . Cardiomyopathy ([facility]) 06/23/2014  . CHF (congestive heart failure) ([facility])   . Chronic lower back pain   . Constipation   . DDD (degenerative disc disease)    at L4-5 and L5-S1  . Essential hypertension 06/23/2014  . Family history of adverse reaction to anesthesia    my daughter " came out in a rage."  . Family history of breast cancer   . Family history of colon cancer   . Obesity (BMI 30-39.9)   . PONV (postoperative nausea and vomiting)    BP  (!) 150/105 (BP Location: Right Arm, Patient Position: Sitting, Cuff Size: Large)   Pulse 100   Resp 14   SpO2 98%   Opioid Risk Score:   Fall Risk Score:  `1  Depression screen PHQ 2/9  Depression screen [facility] 2/9 04/01/2016 11/06/2015 01/23/2015 01/04/2015 12/26/2014 09/04/2014  Decreased Interest 0 0 2 3 3  0  Down, Depressed, Hopeless 0 0 2 3 3  0  PHQ - 2 Score 0 0 4 6 6  0  Altered sleeping - - - 3 2 2   Tired, decreased energy - - - 1 1 2   Change in appetite - - - 0 0 0  Feeling bad or failure about yourself  - - - 0 0 0  Trouble concentrating - - - 0 0 0  Moving slowly or fidgety/restless - - - 0 0 0  Suicidal thoughts - - - 0 0 0  PHQ-9 Score - - - 10 9 4   Difficult doing work/chores - - - - Not difficult at all -    Review of Systems  HENT: Negative.   Eyes: Negative.   Respiratory: Negative.   Cardiovascular: Negative.   Gastrointestinal: Negative.   Endocrine: Negative.   Genitourinary: Negative.   Musculoskeletal: Positive for arthralgias, back pain, gait problem and myalgias.       Spasms   Skin: Negative.   Allergic/Immunologic: Negative.   Neurological: Positive for tremors, weakness and numbness.       Tingling  Hematological: Negative.   Psychiatric/Behavioral: Negative.   All other systems reviewed and are negative.      Objective:   Physical Exam  Constitutional: She is oriented to person, place, and time. She appears well-developed and well-nourished.  HENT:  Head: Normocephalic and atraumatic.  Neck: Normal range of motion. Neck supple.  Cardiovascular: Normal rate and regular rhythm.   Pulmonary/Chest: Effort normal and breath sounds normal.  Musculoskeletal:  Normal Muscle Bulk and Muscle Testing Reveals: Upper Extremities: Full ROM and Muscle Strength 4/5 Lumbar Paraspinal Tenderness: L-4-L-5 Mainly Right Side Right Greater Trochanter Tenderness Lower Extremities: Left: Full ROM and Muscle Strength 5/5 Right: Decreased ROM and Muscle Strength  4/5 Right Lower Extremity Flexion Produces Spasms into Right Lower Extremity  Arises from Table Slowly using walker for support Antalgic gait   Neurological: She is alert and oriented to person, place, and time.  Skin: Skin is warm and dry.  Psychiatric: She has a normal mood and affect.  Nursing note and vitals reviewed.  Assessment & Plan:  1. 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 We will continue the opioid monitoring program, this consists of regular clinic visits, examinations, urine drug screen, pill counts as well as use of [place] Controlled Substance Reporting System. 2. Lumbar Radicular Pain: Continue Gabapentin and Therma Care. 01/01/2017 3. Right sacroiliac disorder: S/P Sacroiliac Joint Fusion with Dr. Yevette Edwardsumonski. 01/01/2017 4.Insomnia: Continue:Ambien 10 mg one tablet at HS prn. 01/01/2017 5. Muscle Spasm: Continue Zanaflex/ Baclofen/ Flexeril. 01/01/2017 6. Right Leg weakness/ Physical Deconditioning: Continue HEP as tolerated. 01/01/2017  20 minutes of face to face patient care time was spent during this visit. All questions were encouraged and answered.   F/U in 1 month

## 2017-01-06 LAB — DRUG TOX MONITOR 1 W/CONF, ORAL FLD
AMPHETAMINES: NEGATIVE ng/mL (ref <10)
BARBITURATES: NEGATIVE ng/mL (ref <10)
BENZODIAZEPINES: NEGATIVE ng/mL (ref <0.50)
Buprenorphine: NEGATIVE ng/mL (ref <0.025)
COCAINE: NEGATIVE ng/mL (ref <2.5)
FENTANYL: NEGATIVE ng/mL (ref <0.10)
Heroin Metabolite: NEGATIVE ng/mL (ref <1.0)
MDMA: NEGATIVE ng/mL (ref <10)
MEPERIDINE: NEGATIVE ng/mL (ref <5.0)
MEPROBAMATE: NEGATIVE ng/mL (ref <2.5)
Marijuana: NEGATIVE ng/mL (ref <2.5)
Methadone: NEGATIVE ng/mL (ref <5.0)
Nicotine Metabolite: NEGATIVE ng/mL (ref <5.0)
Opiates: NEGATIVE ng/mL (ref <2.5)
PROPOXYPHENE: NEGATIVE ng/mL (ref <5.0)
Phencyclidine: NEGATIVE ng/mL (ref <10)
TAPENTADOL: NEGATIVE ng/mL (ref <5.0)
Tramadol: NEGATIVE ng/mL (ref <5.0)
Zolpidem: NEGATIVE ng/mL (ref <5.0)

## 2017-01-06 LAB — DRUG TOX METHYLPHEN W/CONF,ORAL FLD: METHYLPHENIDATE: NEGATIVE ng/mL (ref <1.0)

## 2017-01-06 LAB — DRUG TOX ALC METAB W/CON, ORAL FLD: ALCOHOL METABOLITE: NEGATIVE ng/mL (ref <25)

## 2017-01-08 ENCOUNTER — Telehealth: Payer: Self-pay | Admitting: *Deleted

## 2017-01-08 NOTE — Telephone Encounter (Signed)
Oral swab drug screen was inconsistent for prescribed medications. She reported taking her meds 12/31/16 pm and test was done on 01/01/17 11:00 am.  There is no parent drug or the metabolite (which should at least be present). She was not out of her medication at her appointment.

## 2017-01-08 NOTE — Telephone Encounter (Signed)
discharge

## 2017-01-09 NOTE — Telephone Encounter (Signed)
Attempted to call Ms Molly Norton to inform her of her discharge.  There was no answer and mailbox was full and not accepting messages. I will send letter to her address (no MyChart either) but her address is a PO box so no certified letter can be sent.

## 2017-01-16 ENCOUNTER — Other Ambulatory Visit: Payer: Self-pay | Admitting: Registered Nurse

## 2017-01-23 ENCOUNTER — Ambulatory Visit: Payer: Self-pay | Admitting: Genetic Counselor

## 2017-01-23 ENCOUNTER — Encounter: Payer: Self-pay | Admitting: Genetic Counselor

## 2017-01-23 DIAGNOSIS — Z803 Family history of malignant neoplasm of breast: Secondary | ICD-10-CM

## 2017-01-23 DIAGNOSIS — Z800 Family history of malignant neoplasm of digestive organs: Secondary | ICD-10-CM

## 2017-01-23 DIAGNOSIS — Z1379 Encounter for other screening for genetic and chromosomal anomalies: Secondary | ICD-10-CM

## 2017-01-23 NOTE — Progress Notes (Signed)
HPI: [name] was previously seen in the  Cancer Genetics clinic due to a family history of breast and colon cancer and concerns regarding a hereditary predisposition to cancer. Please refer to our prior cancer genetics clinic note for more information regarding [name]'s medical, social and family histories, and our assessment and recommendations, at the time. [name]'s recent genetic test results were disclosed to her, as were recommendations warranted by these results. These results and recommendations are discussed in more detail below.  CANCER HISTORY:   No history exists.    FAMILY HISTORY:  We obtained a detailed, 4-generation family history.  Significant diagnoses are listed below: Family History  Problem Relation Age of Onset  . Hypertension Mother   . Heart attack Mother 1  . Heart disease Father   . Colon cancer Brother 55  . Breast cancer Maternal Aunt        dx under 45  . Breast cancer Paternal Aunt        dx under 50  . Colon cancer Paternal Uncle        dx over 26  . Colon cancer Paternal Grandmother        dx over 43  . Breast cancer Maternal Aunt 62  . Colon cancer Paternal Aunt        dx over 25  . Breast cancer Paternal Aunt        dx over 71    The patient has one daughter who is cancer free.  She has one sister and three brothers.  Her sister had a prophylactic mastectomy a couple years ago based on the family history.  One brother was diagnosed with colon cancer at age 83.  Both parents are deceased. Her mother passed away from a heart attack at 57 and her father died from complications of a fall at age 37.  Her mother had tow sisters who both had breast cancer, one under 78 and the other at age 10.  The patient's maternal grandmother is alive at 61.  She had a mass in her abdomen in her 60's that was removed, but the patient does not think it was cancer.  Her maternal grandfather died before she was born.  The patient's father had 12  siblings.  One sister had breast cancer under 50 and another had breast cancer over 50.  One aunt had colon cancer over 65 and an uncle had colon cancer over 9.  Her father's mother died of colon cancer over the age of 36, while the patient's grandfather died of old age.  [name] is unaware of previous family history of genetic testing for hereditary cancer risks. Patient's maternal ancestors are of [place] American, Caucasian and [place] descent, and paternal ancestors are of [place] and Spanish descent. There is no reported Ashkenazi Jewish ancestry. There is no known consanguinity.  GENETIC TEST RESULTS: Genetic testing reported out on December 20, 2016 through the Common Hereditary cancer panel found no deleterious mutations.  The Hereditary Gene Panel offered by [facility] includes sequencing and/or deletion duplication testing of the following 46 genes: APC, ATM, AXIN2, BARD1, BMPR1A, BRCA1, BRCA2, BRIP1, CDH1, CDKN2A (p14ARF), CDKN2A (p16INK4a), CHEK2, CTNNA1, DICER1, EPCAM (Deletion/duplication testing only), GREM1 (promoter region deletion/duplication testing only), KIT, MEN1, MLH1, MSH2, MSH3, MSH6, MUTYH, NBN, NF1, NHTL1, PALB2, PDGFRA, PMS2, POLD1, POLE, PTEN, RAD50, RAD51C, RAD51D, SDHB, SDHC, SDHD, SMAD4, SMARCA4. STK11, TP53, TSC1, TSC2, and VHL.  The following genes were evaluated for sequence changes only: SDHA and  HOXB13 c.251G>A variant only.  The test report has been scanned into EPIC and is located under the Molecular Pathology section of the Results Review tab.   We discussed with [name] that since the current genetic testing is not perfect, it is possible there may be a gene mutation in one of these genes that current testing cannot detect, but that chance is small. We also discussed, that it is possible that another gene that has not yet been discovered, or that we have not yet tested, is responsible for the cancer diagnoses in the family, and it is, therefore,  important to remain in touch with cancer genetics in the future so that we can continue to offer [name] the most up to date genetic testing.     CANCER SCREENING RECOMMENDATIONS:  Given [name]'s personal and family histories, we must interpret these negative results with some caution.  Families with features suggestive of hereditary risk for cancer tend to have multiple family members with cancer, diagnoses in multiple generations and diagnoses before the age of 44. [name]'s family exhibits some of these features. Thus this result may simply reflect our current inability to detect all mutations within these genes or there may be a different gene that has not yet been discovered or tested.   RECOMMENDATIONS FOR FAMILY MEMBERS: Women in this family might be at some increased risk of developing cancer, over the general population risk, simply due to the family history of cancer. We recommended women in this family have a yearly mammogram beginning at age 74, or 36 years younger than the earliest onset of cancer, an annual clinical breast exam, and perform monthly breast self-exams. Women in this family should also have a gynecological exam as recommended by their primary provider. All family members should have a colonoscopy by age 51.  Based on [name]'s family history, we recommended her brother, Molly Norton, who was diagnosed with colon cancer at age 71, have genetic counseling and testing. [name] will let us know if we can be of any assistance in coordinating genetic counseling and/or testing for this family member.   FOLLOW-UP: Lastly, we discussed with [name] that cancer genetics is a rapidly advancing field and it is possible that new genetic tests will be appropriate for her and/or her family members in the future. We encouraged her to remain in contact with cancer genetics on an annual basis so we can update her personal and family histories and let her know of advances  in cancer genetics that may benefit this family.   Our contact number was provided. [name]'s questions were answered to her satisfaction, and she knows she is welcome to call us at anytime with additional questions or concerns.   Roma Kayser, MS, [facility] Certified Genetic Counselor [email or web address]

## 2017-01-29 ENCOUNTER — Ambulatory Visit: Payer: Self-pay | Admitting: Registered Nurse

## 2017-04-01 DIAGNOSIS — M79672 Pain in left foot: Secondary | ICD-10-CM | POA: Diagnosis not present

## 2017-04-01 DIAGNOSIS — M109 Gout, unspecified: Secondary | ICD-10-CM | POA: Diagnosis not present

## 2017-04-01 DIAGNOSIS — M85672 Other cyst of bone, left ankle and foot: Secondary | ICD-10-CM | POA: Diagnosis not present

## 2017-04-06 ENCOUNTER — Other Ambulatory Visit: Payer: Self-pay | Admitting: Family Medicine

## 2017-04-06 DIAGNOSIS — M85672 Other cyst of bone, left ankle and foot: Secondary | ICD-10-CM

## 2017-05-04 DIAGNOSIS — I119 Hypertensive heart disease without heart failure: Secondary | ICD-10-CM | POA: Diagnosis not present

## 2017-05-04 DIAGNOSIS — I428 Other cardiomyopathies: Secondary | ICD-10-CM | POA: Diagnosis not present

## 2017-05-04 DIAGNOSIS — E668 Other obesity: Secondary | ICD-10-CM | POA: Diagnosis not present

## 2017-05-04 DIAGNOSIS — I429 Cardiomyopathy, unspecified: Secondary | ICD-10-CM | POA: Diagnosis not present

## 2017-10-27 ENCOUNTER — Other Ambulatory Visit: Payer: Self-pay | Admitting: Obstetrics and Gynecology

## 2017-10-27 DIAGNOSIS — R921 Mammographic calcification found on diagnostic imaging of breast: Secondary | ICD-10-CM | POA: Diagnosis not present

## 2017-10-27 DIAGNOSIS — I119 Hypertensive heart disease without heart failure: Secondary | ICD-10-CM | POA: Diagnosis not present

## 2017-10-27 DIAGNOSIS — E668 Other obesity: Secondary | ICD-10-CM | POA: Diagnosis not present

## 2017-10-27 DIAGNOSIS — R922 Inconclusive mammogram: Secondary | ICD-10-CM | POA: Diagnosis not present

## 2017-10-27 DIAGNOSIS — N6001 Solitary cyst of right breast: Secondary | ICD-10-CM | POA: Diagnosis not present

## 2017-10-27 DIAGNOSIS — I429 Cardiomyopathy, unspecified: Secondary | ICD-10-CM | POA: Diagnosis not present

## 2017-10-28 DIAGNOSIS — Z01419 Encounter for gynecological examination (general) (routine) without abnormal findings: Secondary | ICD-10-CM | POA: Diagnosis not present

## 2017-10-28 DIAGNOSIS — Z6831 Body mass index (BMI) 31.0-31.9, adult: Secondary | ICD-10-CM | POA: Diagnosis not present

## 2017-12-07 DIAGNOSIS — Z1211 Encounter for screening for malignant neoplasm of colon: Secondary | ICD-10-CM | POA: Diagnosis not present

## 2017-12-08 DIAGNOSIS — Z1211 Encounter for screening for malignant neoplasm of colon: Secondary | ICD-10-CM | POA: Diagnosis not present

## 2017-12-08 DIAGNOSIS — E668 Other obesity: Secondary | ICD-10-CM | POA: Diagnosis not present

## 2017-12-08 DIAGNOSIS — I429 Cardiomyopathy, unspecified: Secondary | ICD-10-CM | POA: Diagnosis not present

## 2017-12-08 DIAGNOSIS — I119 Hypertensive heart disease without heart failure: Secondary | ICD-10-CM | POA: Diagnosis not present

## 2018-01-28 ENCOUNTER — Encounter (INDEPENDENT_AMBULATORY_CARE_PROVIDER_SITE_OTHER): Payer: Self-pay

## 2018-02-11 ENCOUNTER — Ambulatory Visit (INDEPENDENT_AMBULATORY_CARE_PROVIDER_SITE_OTHER): Payer: Self-pay | Admitting: Family Medicine

## 2018-02-11 ENCOUNTER — Encounter (INDEPENDENT_AMBULATORY_CARE_PROVIDER_SITE_OTHER): Payer: Self-pay

## 2018-02-22 ENCOUNTER — Encounter (INDEPENDENT_AMBULATORY_CARE_PROVIDER_SITE_OTHER): Payer: Self-pay | Admitting: Family Medicine

## 2018-02-22 ENCOUNTER — Ambulatory Visit (INDEPENDENT_AMBULATORY_CARE_PROVIDER_SITE_OTHER): Payer: Federal, State, Local not specified - PPO | Admitting: Family Medicine

## 2018-02-22 VITALS — BP 128/74 | HR 81 | Temp 98.1°F | Ht 68.0 in | Wt 207.0 lb

## 2018-02-22 DIAGNOSIS — Z1331 Encounter for screening for depression: Secondary | ICD-10-CM | POA: Diagnosis not present

## 2018-02-22 DIAGNOSIS — I10 Essential (primary) hypertension: Secondary | ICD-10-CM | POA: Diagnosis not present

## 2018-02-22 DIAGNOSIS — Z9189 Other specified personal risk factors, not elsewhere classified: Secondary | ICD-10-CM

## 2018-02-22 DIAGNOSIS — Z6831 Body mass index (BMI) 31.0-31.9, adult: Secondary | ICD-10-CM | POA: Diagnosis not present

## 2018-02-22 DIAGNOSIS — E669 Obesity, unspecified: Secondary | ICD-10-CM | POA: Diagnosis not present

## 2018-02-22 DIAGNOSIS — R5383 Other fatigue: Secondary | ICD-10-CM | POA: Diagnosis not present

## 2018-02-22 DIAGNOSIS — R0602 Shortness of breath: Secondary | ICD-10-CM

## 2018-02-22 DIAGNOSIS — Z0289 Encounter for other administrative examinations: Secondary | ICD-10-CM

## 2018-02-23 LAB — CBC WITH DIFFERENTIAL
BASOS: 1 %
Basophils Absolute: 0.1 10*3/uL (ref 0.0–0.2)
EOS (ABSOLUTE): 0.1 10*3/uL (ref 0.0–0.4)
EOS: 1 %
HEMATOCRIT: 39.5 % (ref 34.0–46.6)
Hemoglobin: 12.8 g/dL (ref 11.1–15.9)
Immature Grans (Abs): 0 10*3/uL (ref 0.0–0.1)
Immature Granulocytes: 0 %
LYMPHS ABS: 2.5 10*3/uL (ref 0.7–3.1)
Lymphs: 43 %
MCH: 28 pg (ref 26.6–33.0)
MCHC: 32.4 g/dL (ref 31.5–35.7)
MCV: 86 fL (ref 79–97)
MONOS ABS: 0.5 10*3/uL (ref 0.1–0.9)
Monocytes: 9 %
NEUTROS PCT: 46 %
Neutrophils Absolute: 2.7 10*3/uL (ref 1.4–7.0)
RBC: 4.57 x10E6/uL (ref 3.77–5.28)
RDW: 13.6 % (ref 12.3–15.4)
WBC: 5.9 10*3/uL (ref 3.4–10.8)

## 2018-02-23 LAB — HEMOGLOBIN A1C
Est. average glucose Bld gHb Est-mCnc: 105 mg/dL
Hgb A1c MFr Bld: 5.3 % (ref 4.8–5.6)

## 2018-02-23 LAB — LIPID PANEL WITH LDL/HDL RATIO
Cholesterol, Total: 218 mg/dL — ABNORMAL HIGH (ref 100–199)
HDL: 70 mg/dL (ref >39)
LDL CALC: 139 mg/dL — AB (ref 0–99)
LDL/HDL RATIO: 2 ratio (ref 0.0–3.2)
Triglycerides: 47 mg/dL (ref 0–149)
VLDL CHOLESTEROL CAL: 9 mg/dL (ref 5–40)

## 2018-02-23 LAB — COMPREHENSIVE METABOLIC PANEL
ALBUMIN: 4.7 g/dL (ref 3.5–5.5)
ALT: 13 IU/L (ref 0–32)
AST: 19 IU/L (ref 0–40)
Albumin/Globulin Ratio: 2.1 (ref 1.2–2.2)
Alkaline Phosphatase: 57 IU/L (ref 39–117)
BILIRUBIN TOTAL: 0.2 mg/dL (ref 0.0–1.2)
BUN / CREAT RATIO: 13 (ref 9–23)
BUN: 8 mg/dL (ref 6–24)
CO2: 24 mmol/L (ref 20–29)
Calcium: 8.8 mg/dL (ref 8.7–10.2)
Chloride: 99 mmol/L (ref 96–106)
Creatinine, Ser: 0.62 mg/dL (ref 0.57–1.00)
GFR calc non Af Amer: 105 mL/min/{1.73_m2} (ref >59)
GFR, EST AFRICAN AMERICAN: 122 mL/min/{1.73_m2} (ref >59)
GLOBULIN, TOTAL: 2.2 g/dL (ref 1.5–4.5)
GLUCOSE: 81 mg/dL (ref 65–99)
Potassium: 4.3 mmol/L (ref 3.5–5.2)
SODIUM: 138 mmol/L (ref 134–144)
TOTAL PROTEIN: 6.9 g/dL (ref 6.0–8.5)

## 2018-02-23 LAB — TSH: TSH: 0.869 u[IU]/mL (ref 0.450–4.500)

## 2018-02-23 LAB — T3: T3, Total: 143 ng/dL (ref 71–180)

## 2018-02-23 LAB — VITAMIN B12: Vitamin B-12: 443 pg/mL (ref 232–1245)

## 2018-02-23 LAB — FOLATE: FOLATE: 12.1 ng/mL (ref >3.0)

## 2018-02-23 LAB — VITAMIN D 25 HYDROXY (VIT D DEFICIENCY, FRACTURES): VIT D 25 HYDROXY: 22.5 ng/mL — AB (ref 30.0–100.0)

## 2018-02-23 LAB — T4, FREE: Free T4: 1.23 ng/dL (ref 0.82–1.77)

## 2018-02-23 LAB — INSULIN, RANDOM: INSULIN: 6.5 u[IU]/mL (ref 2.6–24.9)

## 2018-02-23 NOTE — Progress Notes (Signed)
Office: [phone]  /  Fax: [phone]   Dear Dr. Cherly Hensen,   Thank you for referring Molly Norton to our clinic. The following note includes my evaluation and treatment recommendations.  HPI:   Chief Complaint: OBESITY    Molly Norton has been referred by Molly Better, MD for consultation regarding her obesity and obesity related comorbidities.    Molly Norton (MR# [id number]) is a 52 y.o. female who presents on 02/22/2018 for obesity evaluation and treatment. Current BMI is Body mass index is 31.47 kg/m.Molly Norton has been struggling with her weight for many years and has been unsuccessful in either losing weight, maintaining weight loss, or reaching her healthy weight goal.     Molly Norton attended our information session and states she is currently in the action stage of change and ready to dedicate time achieving and maintaining a healthier weight. Molly Norton is interested in becoming our patient and working on intensive lifestyle modifications including (but not limited to) diet, exercise and weight loss.    [name] states her family eats meals together she thinks her family will eat healthier with  her her desired weight loss is 47-52 lbs she started gaining weight in 2010 her heaviest weight ever was 228 lbs she has significant food cravings issues  she snacks frequently in the evenings she skips meals frequently she is frequently drinking liquids with calories she frequently makes poor food choices she has problems with excessive hunger  she frequently eats larger portions than normal    Fatigue Molly Norton feels her energy is lower than it should be. This has worsened with weight gain and has not worsened recently. Molly Norton admits to daytime somnolence and  admits to waking up still tired. Patient is at risk for obstructive sleep apnea. Patent has a history of symptoms of daytime fatigue. Patient generally gets 4 hours of sleep per night, and states  they generally have difficulty falling asleep. Snoring is present. Apneic episodes are not present. Epworth Sleepiness Score is 1.  Dyspnea on exertion Molly Norton notes increasing shortness of breath with exercising and seems to be worsening over time with weight gain. She notes getting out of breath sooner with activity than she used to. This has not gotten worse recently. Alta denies orthopnea.  Hypertension Molly Norton is a 52 y.o. female with hypertension. Molly Norton's blood pressure is controlled today, recent decrease in blood pressure. She denies chest pain. She is working weight loss to help control her blood pressure with the goal of decreasing her risk of heart attack and stroke.   At risk for cardiovascular disease Molly Norton is at a higher than average risk for cardiovascular disease due to obesity and hypertension. She currently denies any chest pain.  Depression Screen Molly Norton's Food and Mood (modified PHQ-9) score was  Depression screen PHQ 2/9 02/22/2018  Decreased Interest 1  Down, Depressed, Hopeless 3  PHQ - 2 Score 4  Altered sleeping 3  Tired, decreased energy 3  Change in appetite 0  Feeling bad or failure about yourself  0  Trouble concentrating 0  Moving slowly or fidgety/restless 0  Suicidal thoughts 0  PHQ-9 Score 10  Difficult doing work/chores Extremely dIfficult    ALLERGIES: Allergies  Allergen Reactions  . Adhesive [Tape]     Steri-strips caused blisters  . Latex Itching and Rash  . Lisinopril     Difficult swallowing  . Relafen [Nabumetone] Swelling  . Valium [Diazepam] Other (See Comments)    Causes hyper activity Has opposite  effect ,becomes very agitated  . Codeine Itching  . Other     NO BLOOD OR BLOOD PRODUCTS  . Robaxin [Methocarbamol] Other (See Comments)    Hospital gave it to her she is unsure of reaction    MEDICATIONS: Current Outpatient Medications on File Prior to Visit  Medication Sig Dispense Refill  . baclofen  (LIORESAL) 20 MG tablet Take 1 tablet (20 mg total) by mouth 4 (four) times daily. 120 each 5  . cyclobenzaprine (FLEXERIL) 10 MG tablet Take 1 tablet (10 mg total) by mouth at bedtime. 30 tablet 2  . furosemide (LASIX) 40 MG tablet Take 40 mg by mouth daily.     [name]. gabapentin (NEURONTIN) 100 MG capsule Take two capsules four times a day 240 capsule 3  . hydrALAZINE (APRESOLINE) 50 MG tablet Take 50 mg by mouth 2 (two) times daily.     . metoprolol succinate (TOPROL-XL) 25 MG 24 hr tablet Take 12.5 mg by mouth daily.  6  . oxyCODONE-acetaminophen (PERCOCET) 5-325 MG tablet Take 1 tablet by mouth 2 (two) times daily as needed for severe pain. 60 tablet 0  . pregabalin (LYRICA) 50 MG capsule Take 50 mg by mouth 3 (three) times daily.    [name]. spironolactone (ALDACTONE) 25 MG tablet Take 25 mg by mouth daily.    [name]. tiZANidine (ZANAFLEX) 4 MG tablet Take 4 mg by mouth every 6 (six) hours as needed for muscle spasms.    . traZODone (DESYREL) 50 MG tablet Take 50 mg by mouth at bedtime.    . [DISCONTINUED] DULoxetine (CYMBALTA) 30 MG capsule Take 30 mg by mouth daily.     No current facility-administered medications on file prior to visit.     PAST MEDICAL HISTORY: Past Medical History:  Diagnosis Date  . Anemia   . Cardiomyopathy (HCC) 06/23/2014  . CHF (congestive heart failure) (HCC)   . Chronic lower back pain   . Constipation   . DDD (degenerative disc disease)    at L4-5 and L5-S1  . Essential hypertension 06/23/2014  . Family history of adverse reaction to anesthesia    my daughter " came out in a rage."  . Family history of breast cancer   . Family history of colon cancer   . Hypertension   . Joint pain   . Obesity (BMI 30-39.9)   . PONV (postoperative nausea and vomiting)   . Swelling of lower leg   . Vitamin D deficiency     PAST SURGICAL HISTORY: Past Surgical History:  Procedure Laterality Date  . ABDOMINAL HYSTERECTOMY    . ANTERIOR LUMBAR FUSION  12/19/2010   L4-5 and L5-S1   . BREAST LUMPECTOMY    . CESAREAN SECTION  1990  . COLONOSCOPY    . LAPAROSCOPIC OVARIAN CYSTECTOMY     post cyst rupture  . LEFT AND RIGHT HEART CATHETERIZATION WITH CORONARY ANGIOGRAM N/A 06/27/2014   Procedure: LEFT AND RIGHT HEART CATHETERIZATION WITH CORONARY ANGIOGRAM;  Surgeon: Corky CraftsJayadeep S Varanasi, MD;  [facility];  Service: Cardiovascular;  Laterality: N/A;  . NASAL SINUS SURGERY    . SACROILIAC JOINT FUSION Right 05/17/2015   Procedure: SACROILIAC JOINT FUSION;  Surgeon: Estill BambergMark Dumonski, MD;  [facility];  Service: Orthopedics;  Laterality: Right;  Right sided sacroiliac joint fusion  . WISDOM TOOTH EXTRACTION      SOCIAL HISTORY: Social History   Tobacco Use  . Smoking status: Never Smoker  . Smokeless tobacco: Never Used  Substance Use Topics  .  Alcohol use: Yes    Comment: rare  . Drug use: No    FAMILY HISTORY: Family History  Problem Relation Age of Onset  . Hypertension Mother   . Heart attack Mother 58  . Obesity Mother   . Heart disease Mother   . Heart disease Father   . High blood pressure Father   . Obesity Father   . Colon cancer Brother 25  . Breast cancer Maternal Aunt        dx under 45  . Breast cancer Paternal Aunt        dx under 50  . Colon cancer Paternal Uncle        dx over 74  . Colon cancer Paternal Grandmother        dx over 57  . Breast cancer Maternal Aunt 62  . Colon cancer Paternal Aunt        dx over 75  . Breast cancer Paternal Aunt        dx over 50    ROS: Review of Systems  Constitutional: Positive for malaise/fatigue. Negative for weight loss.       + Trouble sleeping  Respiratory: Positive for shortness of breath.   Cardiovascular: Negative for chest pain and orthopnea.  Gastrointestinal: Positive for constipation.  Musculoskeletal: Positive for back pain.       + Muscle or joint pain Muscle stiffness  Neurological: Positive for weakness.    PHYSICAL EXAM: Blood pressure 128/74, pulse 81,  temperature 98.1 F (36.7 C), temperature source Oral, height 5\' 8"  (1.727 m), weight 207 lb (93.9 kg), SpO2 99 %. Body mass index is 31.47 kg/m. Physical Exam  Constitutional: She is oriented to person, place, and time. She appears well-developed and well-nourished.  HENT:  Head: Normocephalic and atraumatic.  Nose: Nose normal.  Eyes: EOM are normal. No scleral icterus.  Neck: Normal range of motion. Neck supple. No thyromegaly present.  Cardiovascular: Normal rate and regular rhythm.  Pulmonary/Chest: Effort normal. No respiratory distress.  Abdominal: Soft. There is no tenderness.  + Obesity  Musculoskeletal:  Range of Motion normal in all 4 extremities Trace edema noted in bilateral lower extremities  Neurological: She is alert and oriented to person, place, and time. Coordination normal.  Skin: Skin is warm and dry.  Psychiatric: She has a normal mood and affect. Her behavior is normal.  Vitals reviewed.   RECENT LABS AND TESTS: BMET    Component Value Date/Time   NA 138 02/22/2018 1242   K 4.3 02/22/2018 1242   CL 99 02/22/2018 1242   CO2 24 02/22/2018 1242   GLUCOSE 81 02/22/2018 1242   GLUCOSE 83 08/23/2016 1223   BUN 8 02/22/2018 1242   CREATININE 0.62 02/22/2018 1242   CALCIUM 8.8 02/22/2018 1242   GFRNONAA 105 02/22/2018 1242   GFRAA 122 02/22/2018 1242   Lab Results  Component Value Date   HGBA1C 5.3 02/22/2018   Lab Results  Component Value Date   INSULIN 6.5 02/22/2018   CBC    Component Value Date/Time   WBC 5.9 02/22/2018 1242   WBC 10.2 08/23/2016 1223   RBC 4.57 02/22/2018 1242   RBC 4.73 08/23/2016 1223   HGB 12.8 02/22/2018 1242   HCT 39.5 02/22/2018 1242   PLT 200 08/23/2016 1223   MCV 86 02/22/2018 1242   MCH 28.0 02/22/2018 1242   MCH 27.5 08/23/2016 1223   MCHC 32.4 02/22/2018 1242   MCHC 32.8 08/23/2016 1223   RDW 13.6 02/22/2018  1242   LYMPHSABS 2.5 02/22/2018 1242   MONOABS 0.4 05/10/2015 1439   EOSABS 0.1 02/22/2018 1242     BASOSABS 0.1 02/22/2018 1242   Iron/TIBC/Ferritin/ %Sat No results found for: IRON, TIBC, FERRITIN, IRONPCTSAT Lipid Panel     Component Value Date/Time   CHOL 218 (H) 02/22/2018 1242   TRIG 47 02/22/2018 1242   HDL 70 02/22/2018 1242   LDLCALC 139 (H) 02/22/2018 1242   Hepatic Function Panel     Component Value Date/Time   PROT 6.9 02/22/2018 1242   ALBUMIN 4.7 02/22/2018 1242   AST 19 02/22/2018 1242   ALT 13 02/22/2018 1242   ALKPHOS 57 02/22/2018 1242   BILITOT 0.2 02/22/2018 1242      Component Value Date/Time   TSH 0.869 02/22/2018 1242    ECG  shows NSR with a rate of 76 BPM INDIRECT CALORIMETER done today shows a VO2 of 295 and a REE of 2055.  Her calculated basal metabolic rate is 1610 thus her basal metabolic rate is Norton than expected.    ASSESSMENT AND PLAN: Other fatigue - Plan: EKG 12-Lead, CBC With Differential, Hemoglobin A1c, Insulin, random, Lipid Panel With LDL/HDL Ratio, VITAMIN D 25 Hydroxy (Vit-D Deficiency, Fractures), Vitamin B12, Folate, T3, T4, free, TSH  Shortness of breath on exertion  Essential hypertension - Plan: Comprehensive metabolic panel, CBC With Differential, Hemoglobin A1c, Insulin, random, Lipid Panel With LDL/HDL Ratio  Depression screening  At risk for heart disease  Class 1 obesity with serious comorbidity and body mass index (BMI) of 31.0 to 31.9 in adult, unspecified obesity type  PLAN:  Fatigue Johnna was informed that her fatigue may be related to obesity, depression or many other causes. Labs will be ordered, and in the meanwhile Anylah has agreed to work on diet, exercise and weight loss to help with fatigue. Proper sleep hygiene was discussed including the need for 7-8 hours of quality sleep each night. A sleep study was not ordered based on symptoms and Epworth score.  Dyspnea on exertion Judith's shortness of breath appears to be obesity related and exercise induced. She has agreed to work on weight  loss and gradually increase exercise to treat her exercise induced shortness of breath. If Tonee follows our instructions and loses weight without improvement of her shortness of breath, we will plan to refer to pulmonology. We will monitor this condition regularly. Rosabel agrees to this plan.  Hypertension We discussed sodium restriction, working on healthy weight loss, and a regular exercise program as the means to achieve improved blood pressure control. Judit agreed with this plan and agreed to follow up as directed. We will continue to monitor her blood pressure as well as her progress with the above lifestyle modifications. Sherri will continue her medications as prescribed and will watch for signs of hypotension as she continues her lifestyle modifications. EKG was done and we will check labs today. Dashawn agrees to follow up with our clinic in 2 weeks.  Cardiovascular risk counselling Brealynn was given extended (15 minutes) coronary artery disease prevention counseling today. She is 52 y.o. female and has risk factors for heart disease including obesity and hypertension. We discussed intensive lifestyle modifications today with an emphasis on specific weight loss instructions and strategies. Pt was also informed of the importance of increasing exercise and decreasing saturated fats to help prevent heart disease.  Depression Screen Mariena had a moderately positive depression screening. Depression is commonly associated with obesity and often results in emotional eating behaviors.  We will monitor this closely and work on CBT to help improve the non-hunger eating patterns. Referral to Psychology may be required if no improvement is seen as she continues in our clinic.  Obesity Rayanne is currently in the action stage of change and her goal is to continue with weight loss efforts. I recommend Damarys begin the structured treatment plan as follows:  She has agreed to follow the  Category 3 plan Suesan has been instructed to eventually work up to a goal of 150 minutes of combined cardio and strengthening exercise per week for weight loss and overall health benefits. We discussed the following Behavioral Modification Strategies today: increasing lean protein intake, increasing vegetables, work on meal planning and easy cooking plans, and planning for success   She was informed of the importance of frequent follow up visits to maximize her success with intensive lifestyle modifications for her multiple health conditions. She was informed we would discuss her lab results at her next visit unless there is a critical issue that needs to be addressed sooner. Deisha agreed to keep her next visit at the agreed upon time to discuss these results.    OBESITY BEHAVIORAL INTERVENTION VISIT  Today's visit was # 1   Starting weight: 207 lbs Starting date: 02/22/18 Today's weight : 207 lbs Today's date: 02/22/2018 Total lbs lost to date: 0    ASK: We discussed the diagnosis of obesity with Prince Rome today and Molly Norton agreed to give [place] permission to discuss obesity behavioral modification therapy today.  ASSESS: Destanie has the diagnosis of obesity and her BMI today is 31.48 Daliah is in the action stage of change   ADVISE: Venita was educated on the multiple health risks of obesity as well as the benefit of weight loss to improve her health. She was advised of the need for long term treatment and the importance of lifestyle modifications to improve her current health and to decrease her risk of future health problems.  AGREE: Multiple dietary modification options and treatment options were discussed and  Gordon agreed to follow the recommendations documented in the above note.  ARRANGE: Madisson was educated on the importance of frequent visits to treat obesity as outlined per CMS and USPSTF guidelines and agreed to schedule her next follow up  appointment today.  I, Burt Knack, am acting as transcriptionist for Debbra Riding, MD  I have reviewed the above documentation for accuracy and completeness, and I agree with the above. - Debbra Riding, MD

## 2018-03-08 ENCOUNTER — Ambulatory Visit (INDEPENDENT_AMBULATORY_CARE_PROVIDER_SITE_OTHER): Payer: Federal, State, Local not specified - PPO | Admitting: Family Medicine

## 2018-03-08 VITALS — BP 124/85 | HR 85 | Temp 98.1°F | Ht 68.0 in | Wt 202.0 lb

## 2018-03-08 DIAGNOSIS — E559 Vitamin D deficiency, unspecified: Secondary | ICD-10-CM | POA: Insufficient documentation

## 2018-03-08 DIAGNOSIS — Z9189 Other specified personal risk factors, not elsewhere classified: Secondary | ICD-10-CM | POA: Diagnosis not present

## 2018-03-08 DIAGNOSIS — K5909 Other constipation: Secondary | ICD-10-CM | POA: Insufficient documentation

## 2018-03-08 DIAGNOSIS — E7849 Other hyperlipidemia: Secondary | ICD-10-CM

## 2018-03-08 DIAGNOSIS — Z6830 Body mass index (BMI) 30.0-30.9, adult: Secondary | ICD-10-CM

## 2018-03-08 DIAGNOSIS — E669 Obesity, unspecified: Secondary | ICD-10-CM

## 2018-03-08 MED ORDER — VITAMIN D (ERGOCALCIFEROL) 1.25 MG (50000 UNIT) PO CAPS: 50000.0000 [IU] | ORAL_CAPSULE | ORAL | 0 refills | Status: DC

## 2018-03-08 MED ORDER — DOCUSATE SODIUM 100 MG PO CAPS: 100.0000 mg | ORAL_CAPSULE | Freq: Every day | ORAL | 0 refills | Status: DC

## 2018-03-08 NOTE — Progress Notes (Signed)
Office: [phone]  /  Fax: [phone]   HPI:   Chief Complaint: OBESITY Molly Norton is here to discuss her progress with her obesity treatment plan. She is on the  follow the Category 3 plan and is following her eating plan approximately 100 % of the time. She states she is exercising 0 minutes 0 times per week. [name] is currently struggling with hunger at night. She finds herself very nauseous with quantity of food. She misses snacking at night. She has a lot of questions today.  Her weight is 202 lb (91.6 kg) today and has had a weight loss of 5 pounds over a period of 2 weeks since her last visit. She has lost 5 lbs since starting treatment with [place].  Constipation [name] notes constipation for the last few weeks, worse since attempting weight loss. She states BM are less frequent and are not hard and painful. She denies hematochezia or melena. She admits to drinking less H20 recently. She is currently taking Miralax daily but still struggling with constipation.   Vitamin D deficiency [name] has a diagnosis of vitamin D deficiency. She is not currently taking vit D and denies nausea, vomiting or muscle weakness. She reports fatigue    Ref. Range 02/22/2018 12:42  Vitamin D, 25-Hydroxy Latest Ref Range: 30.0 - 100.0 ng/mL 22.5 (L)   Hyperlipidemia [name] has hyperlipidemia she has LDL of 139. She denies previous history diagnosis and has been trying to improve her cholesterol levels with intensive lifestyle modification including a low saturated fat diet, exercise and weight loss. She denies any chest pain, claudication or myalgias.  At risk for osteopenia and osteoporosis [name] is at higher risk of osteopenia and osteoporosis due to vitamin D deficiency.   ALLERGIES: Allergies  Allergen Reactions  . Adhesive [Tape]     Steri-strips caused blisters  . Latex Itching and Rash  . Lisinopril     Difficult swallowing  . Relafen [Nabumetone] Swelling  . Valium [Diazepam]  Other (See Comments)    Causes hyper activity Has opposite effect ,becomes very agitated  . Codeine Itching  . Other     NO BLOOD OR BLOOD PRODUCTS  . Robaxin [Methocarbamol] Other (See Comments)    Hospital gave it to her she is unsure of reaction    MEDICATIONS: Current Outpatient Medications on File Prior to Visit  Medication Sig Dispense Refill  . baclofen (LIORESAL) 20 MG tablet Take 1 tablet (20 mg total) by mouth 4 (four) times daily. 120 each 5  . cyclobenzaprine (FLEXERIL) 10 MG tablet Take 1 tablet (10 mg total) by mouth at bedtime. 30 tablet 2  . furosemide (LASIX) 40 MG tablet Take 40 mg by mouth daily.     [name] gabapentin (NEURONTIN) 100 MG capsule Take two capsules four times a day 240 capsule 3  . hydrALAZINE (APRESOLINE) 50 MG tablet Take 50 mg by mouth 2 (two) times daily.     . metoprolol succinate (TOPROL-XL) 25 MG 24 hr tablet Take 12.5 mg by mouth daily.  6  . oxyCODONE-acetaminophen (PERCOCET) 5-325 MG tablet Take 1 tablet by mouth 2 (two) times daily as needed for severe pain. 60 tablet 0  . pregabalin (LYRICA) 50 MG capsule Take 50 mg by mouth 3 (three) times daily.    [name] spironolactone (ALDACTONE) 25 MG tablet Take 25 mg by mouth daily.    [name] tiZANidine (ZANAFLEX) 4 MG tablet Take 4 mg by mouth every 6 (six) hours as needed for muscle spasms.    [name]  traZODone (DESYREL) 50 MG tablet Take 50 mg by mouth at bedtime.    . [DISCONTINUED] DULoxetine (CYMBALTA) 30 MG capsule Take 30 mg by mouth daily.     No current facility-administered medications on file prior to visit.     PAST MEDICAL HISTORY: Past Medical History:  Diagnosis Date  . Anemia   . Cardiomyopathy (HCC) 06/23/2014  . CHF (congestive heart failure) (HCC)   . Chronic lower back pain   . Constipation   . DDD (degenerative disc disease)    at L4-5 and L5-S1  . Essential hypertension 06/23/2014  . Family history of adverse reaction to anesthesia    my daughter " came out in a rage."  . Family history of  breast cancer   . Family history of colon cancer   . Hypertension   . Joint pain   . Obesity (BMI 30-39.9)   . PONV (postoperative nausea and vomiting)   . Swelling of lower leg   . Vitamin D deficiency     PAST SURGICAL HISTORY: Past Surgical History:  Procedure Laterality Date  . ABDOMINAL HYSTERECTOMY    . ANTERIOR LUMBAR FUSION  12/19/2010   L4-5 and L5-S1  . BREAST LUMPECTOMY    . CESAREAN SECTION  1990  . COLONOSCOPY    . LAPAROSCOPIC OVARIAN CYSTECTOMY     post cyst rupture  . LEFT AND RIGHT HEART CATHETERIZATION WITH CORONARY ANGIOGRAM N/A 06/27/2014   Procedure: LEFT AND RIGHT HEART CATHETERIZATION WITH CORONARY ANGIOGRAM;  Surgeon: Corky Crafts, MD;  Location: [facility] CATH LAB;  Service: Cardiovascular;  Laterality: N/A;  . NASAL SINUS SURGERY    . SACROILIAC JOINT FUSION Right 05/17/2015   Procedure: SACROILIAC JOINT FUSION;  Surgeon: Estill Bamberg, MD;  Location: [facility] OR;  Service: Orthopedics;  Laterality: Right;  Right sided sacroiliac joint fusion  . WISDOM TOOTH EXTRACTION      SOCIAL HISTORY: Social History   Tobacco Use  . Smoking status: Never Smoker  . Smokeless tobacco: Never Used  Substance Use Topics  . Alcohol use: Yes    Comment: rare  . Drug use: No    FAMILY HISTORY: Family History  Problem Relation Age of Onset  . Hypertension Mother   . Heart attack Mother 41  . Obesity Mother   . Heart disease Mother   . Heart disease Father   . High blood pressure Father   . Obesity Father   . Colon cancer Brother 62  . Breast cancer Maternal Aunt        dx under 45  . Breast cancer Paternal Aunt        dx under 50  . Colon cancer Paternal Uncle        dx over 65  . Colon cancer Paternal Grandmother        dx over 8  . Breast cancer Maternal Aunt 62  . Colon cancer Paternal Aunt        dx over 35  . Breast cancer Paternal Aunt        dx over 50    ROS: Review of Systems  Constitutional: Positive for malaise/fatigue and weight loss.    Cardiovascular: Negative for chest pain and claudication.  Gastrointestinal: Positive for constipation. Negative for melena, nausea and vomiting.       Negative for hematochezia   Musculoskeletal: Negative for myalgias.       Negative for muscle weakness    PHYSICAL EXAM: Blood pressure 124/85, pulse 85, temperature 98.1 F (36.7  C), temperature source Oral, height 5\' 8"  (1.727 m), weight 202 lb (91.6 kg), SpO2 93 %. Body mass index is 30.71 kg/m. Physical Exam  Constitutional: She is oriented to person, place, and time. She appears well-developed and well-nourished.  HENT:  Head: Normocephalic.  Cardiovascular: Normal rate.  Pulmonary/Chest: Effort normal.  Musculoskeletal: Normal range of motion.  Neurological: She is alert and oriented to person, place, and time.  Skin: Skin is warm and dry.  Psychiatric: She has a normal mood and affect. Her behavior is normal.  Vitals reviewed.   RECENT LABS AND TESTS: BMET    Component Value Date/Time   NA 138 02/22/2018 1242   K 4.3 02/22/2018 1242   CL 99 02/22/2018 1242   CO2 24 02/22/2018 1242   GLUCOSE 81 02/22/2018 1242   GLUCOSE 83 08/23/2016 1223   BUN 8 02/22/2018 1242   CREATININE 0.62 02/22/2018 1242   CALCIUM 8.8 02/22/2018 1242   GFRNONAA 105 02/22/2018 1242   GFRAA 122 02/22/2018 1242   Lab Results  Component Value Date   HGBA1C 5.3 02/22/2018   Lab Results  Component Value Date   INSULIN 6.5 02/22/2018   CBC    Component Value Date/Time   WBC 5.9 02/22/2018 1242   WBC 10.2 08/23/2016 1223   RBC 4.57 02/22/2018 1242   RBC 4.73 08/23/2016 1223   HGB 12.8 02/22/2018 1242   HCT 39.5 02/22/2018 1242   PLT 200 08/23/2016 1223   MCV 86 02/22/2018 1242   MCH 28.0 02/22/2018 1242   MCH 27.5 08/23/2016 1223   MCHC 32.4 02/22/2018 1242   MCHC 32.8 08/23/2016 1223   RDW 13.6 02/22/2018 1242   LYMPHSABS 2.5 02/22/2018 1242   MONOABS 0.4 05/10/2015 1439   EOSABS 0.1 02/22/2018 1242   BASOSABS 0.1 02/22/2018  1242   Iron/TIBC/Ferritin/ %Sat No results found for: IRON, TIBC, FERRITIN, IRONPCTSAT Lipid Panel     Component Value Date/Time   CHOL 218 (H) 02/22/2018 1242   TRIG 47 02/22/2018 1242   HDL 70 02/22/2018 1242   LDLCALC 139 (H) 02/22/2018 1242   Hepatic Function Panel     Component Value Date/Time   PROT 6.9 02/22/2018 1242   ALBUMIN 4.7 02/22/2018 1242   AST 19 02/22/2018 1242   ALT 13 02/22/2018 1242   ALKPHOS 57 02/22/2018 1242   BILITOT 0.2 02/22/2018 1242      Component Value Date/Time   TSH 0.869 02/22/2018 1242    Ref. Range 02/22/2018 12:42  Vitamin D, 25-Hydroxy Latest Ref Range: 30.0 - 100.0 ng/mL 22.5 (L)    ASSESSMENT AND PLAN: Other constipation - Plan: docusate sodium (COLACE) 100 MG capsule  Vitamin D deficiency - Plan: Vitamin D, Ergocalciferol, (DRISDOL) 50000 units CAPS capsule  Other hyperlipidemia  At risk for osteoporosis  Class 1 obesity with serious comorbidity and body mass index (BMI) of 30.0 to 30.9 in adult, unspecified obesity type  PLAN: Constipation  Marveline was informed decrease bowel movement frequency is normal while losing weight, but stools should not be hard or painful. She was advised to increase her H20 intake and work on increasing her fiber intake. High fiber foods were discussed today. She agrees to take Colace 100 mg daily #30 with no refills. Agrees to follow up with our clinic as directed.   Vitamin D Deficiency Fermina was informed that low vitamin D levels contributes to fatigue and are associated with obesity, breast, and colon cancer. She agrees to start to take prescription Vit D @50 ,000  IU every week #4 with no refills and will follow up for routine testing of vitamin D, at least 2-3 times per year. She was informed of the risk of over-replacement of vitamin D and agrees to not increase her dose unless she discusses this with [place] first. Agrees to follow up with our clinic as directed.   Hyperlipidemia Nelida was  informed of the American Heart Association Guidelines emphasizing intensive lifestyle modifications as the first line treatment for hyperlipidemia. We discussed many lifestyle modifications today in depth, and Laterica will continue to work on decreasing saturated fats such as fatty red meat, butter and many fried foods. She will also increase vegetables and lean protein in her diet and continue to work on exercise and weight loss efforts. We will repeat FLP in 3 months.   At risk for osteopenia and osteoporosis Maymuna was given extended  (15 minutes) osteoporosis prevention counseling today. Marquia is at risk for osteopenia and osteoporosis due to her vitamin D deficiency. She was encouraged to take her vitamin D and follow her higher calcium diet and increase strengthening exercise to help strengthen her bones and decrease her risk of osteopenia and osteoporosis.   Obesity Arionna is currently in the action stage of change. As such, her goal is to continue with weight loss efforts She has agreed to follow the Category 3 plan Dhyana has been instructed to work up to a goal of 150 minutes of combined cardio and strengthening exercise per week for weight loss and overall health benefits. We discussed the following Behavioral Modification Strategies today: increasing lean protein intake, increasing vegetables, planning for success, and work on meal planning and easy cooking plans.    Timmia has agreed to follow up with our clinic in 2 weeks. She was informed of the importance of frequent follow up visits to maximize her success with intensive lifestyle modifications for her multiple health conditions.   OBESITY BEHAVIORAL INTERVENTION VISIT  Today's visit was # 2   Starting weight: 207 lb Starting date: 02/22/18 Today's weight :202 lb Today's date: 03/08/2018 Total lbs lost to date: 5 lb  ASK: We discussed the diagnosis of obesity with Prince Rome today and Cala Bradford agreed to  give [place] permission to discuss obesity behavioral modification therapy today.  ASSESS: Zayonna has the diagnosis of obesity and her BMI today is 30.72 Aamari is in the action stage of change   ADVISE: Zaelynn was educated on the multiple health risks of obesity as well as the benefit of weight loss to improve her health. She was advised of the need for long term treatment and the importance of lifestyle modifications to improve her current health and to decrease her risk of future health problems.  AGREE: Multiple dietary modification options and treatment options were discussed and  Marcee agreed to follow the recommendations documented in the above note.  ARRANGE: Jessieca was educated on the importance of frequent visits to treat obesity as outlined per CMS and USPSTF guidelines and agreed to schedule her next follow up appointment today.  I, Jeralene Peters, am acting as transcriptionist for Debbra Riding, MD   I have reviewed the above documentation for accuracy and completeness, and I agree with the above. - Debbra Riding, MD

## 2018-03-09 ENCOUNTER — Telehealth (INDEPENDENT_AMBULATORY_CARE_PROVIDER_SITE_OTHER): Payer: Self-pay | Admitting: Family Medicine

## 2018-03-09 NOTE — Telephone Encounter (Signed)
Patient states that [facility] at [address], [place], [place] has not received the prescriptions from Dr. Rinaldo Ratel.  Pt was seen on 9/30.  Please advise. Thank you.

## 2018-03-10 ENCOUNTER — Encounter (INDEPENDENT_AMBULATORY_CARE_PROVIDER_SITE_OTHER): Payer: Self-pay

## 2018-03-10 NOTE — Telephone Encounter (Signed)
Sent the pt a mychart message. Isaic Syler, CMA

## 2018-03-11 ENCOUNTER — Other Ambulatory Visit (INDEPENDENT_AMBULATORY_CARE_PROVIDER_SITE_OTHER): Payer: Self-pay

## 2018-03-11 DIAGNOSIS — K5909 Other constipation: Secondary | ICD-10-CM

## 2018-03-11 DIAGNOSIS — E559 Vitamin D deficiency, unspecified: Secondary | ICD-10-CM

## 2018-03-11 MED ORDER — DOCUSATE SODIUM 100 MG PO CAPS: 100.0000 mg | ORAL_CAPSULE | Freq: Every day | ORAL | 0 refills | Status: AC

## 2018-03-11 MED ORDER — VITAMIN D (ERGOCALCIFEROL) 1.25 MG (50000 UNIT) PO CAPS: 50000.0000 [IU] | ORAL_CAPSULE | ORAL | 0 refills | Status: DC

## 2018-03-22 ENCOUNTER — Ambulatory Visit (INDEPENDENT_AMBULATORY_CARE_PROVIDER_SITE_OTHER): Payer: Federal, State, Local not specified - PPO | Admitting: Family Medicine

## 2018-03-22 VITALS — BP 141/89 | HR 78 | Temp 98.1°F | Ht 68.0 in | Wt 201.0 lb

## 2018-03-22 DIAGNOSIS — I10 Essential (primary) hypertension: Secondary | ICD-10-CM | POA: Diagnosis not present

## 2018-03-22 DIAGNOSIS — Z6830 Body mass index (BMI) 30.0-30.9, adult: Secondary | ICD-10-CM | POA: Diagnosis not present

## 2018-03-22 DIAGNOSIS — E559 Vitamin D deficiency, unspecified: Secondary | ICD-10-CM | POA: Diagnosis not present

## 2018-03-22 DIAGNOSIS — E669 Obesity, unspecified: Secondary | ICD-10-CM

## 2018-03-23 NOTE — Progress Notes (Signed)
Office: [phone]  /  Fax: [phone]   HPI:   Chief Complaint: OBESITY Molly Norton is here to discuss her progress with her obesity treatment plan. She is on the Category 3 plan and is following her eating plan approximately 98 % of the time. She states she is exercising 0 minutes 0 times per week. Molly Norton went out 2 times last week and is eating [employer] sandwiches for lunch. She doesn't think she is getting 4 ounces of deli meat for lunch. She has had a sinus infection for 8 days and knows that she wasn't eating enough.  Her weight is 201 lb (91.2 kg) today and has had a weight loss of 1 pound over a period of 2 weeks since her last visit. She has lost 6 lbs since starting treatment with [place].  Vitamin D deficiency Molly Norton has a diagnosis of vitamin D deficiency. She is currently taking vit D. She admits fatigue and denies nausea, vomiting or muscle weakness.  Hypertension Molly Norton is a 52 y.o. female with hypertension. [name] denies chest pain, chest pressure, or headaches. She is working on weight loss to help control her blood pressure with the goal of decreasing her risk of heart attack and stroke. Molly Norton's blood pressure is slightly elevated. She took her blood pressure medicines today, but she is taking Advil Cold and Sinus.  ALLERGIES: Allergies  Allergen Reactions  . Adhesive [Tape]     Steri-strips caused blisters  . Latex Itching and Rash  . Lisinopril     Difficult swallowing  . Relafen [Nabumetone] Swelling  . Valium [Diazepam] Other (See Comments)    Causes hyper activity Has opposite effect ,becomes very agitated  . Codeine Itching  . Other     NO BLOOD OR BLOOD PRODUCTS  . Robaxin [Methocarbamol] Other (See Comments)    Hospital gave it to her she is unsure of reaction    MEDICATIONS: Current Outpatient Medications on File Prior to Visit  Medication Sig Dispense Refill  . baclofen (LIORESAL) 20 MG tablet Take 1 tablet (20 mg total) by mouth  4 (four) times daily. 120 each 5  . cyclobenzaprine (FLEXERIL) 10 MG tablet Take 1 tablet (10 mg total) by mouth at bedtime. 30 tablet 2  . docusate sodium (COLACE) 100 MG capsule Take 1 capsule (100 mg total) by mouth daily. 30 capsule 0  . furosemide (LASIX) 40 MG tablet Take 40 mg by mouth daily.     [name] gabapentin (NEURONTIN) 100 MG capsule Take two capsules four times a day 240 capsule 3  . hydrALAZINE (APRESOLINE) 50 MG tablet Take 50 mg by mouth 2 (two) times daily.     . metoprolol succinate (TOPROL-XL) 25 MG 24 hr tablet Take 12.5 mg by mouth daily.  6  . oxyCODONE-acetaminophen (PERCOCET) 5-325 MG tablet Take 1 tablet by mouth 2 (two) times daily as needed for severe pain. 60 tablet 0  . pregabalin (LYRICA) 50 MG capsule Take 50 mg by mouth 3 (three) times daily.    [name] spironolactone (ALDACTONE) 25 MG tablet Take 25 mg by mouth daily.    [name] tiZANidine (ZANAFLEX) 4 MG tablet Take 4 mg by mouth every 6 (six) hours as needed for muscle spasms.    . traZODone (DESYREL) 50 MG tablet Take 50 mg by mouth at bedtime.    . Vitamin D, Ergocalciferol, (DRISDOL) 50000 units CAPS capsule Take 1 capsule (50,000 Units total) by mouth every 7 (seven) days. 4 capsule 0  . [DISCONTINUED] DULoxetine (CYMBALTA)  30 MG capsule Take 30 mg by mouth daily.     No current facility-administered medications on file prior to visit.     PAST MEDICAL HISTORY: Past Medical History:  Diagnosis Date  . Anemia   . Cardiomyopathy (HCC) 06/23/2014  . CHF (congestive heart failure) (HCC)   . Chronic lower back pain   . Constipation   . DDD (degenerative disc disease)    at L4-5 and L5-S1  . Essential hypertension 06/23/2014  . Family history of adverse reaction to anesthesia    my daughter " came out in a rage."  . Family history of breast cancer   . Family history of colon cancer   . Hypertension   . Joint pain   . Obesity (BMI 30-39.9)   . PONV (postoperative nausea and vomiting)   . Swelling of lower leg   .  Vitamin D deficiency     PAST SURGICAL HISTORY: Past Surgical History:  Procedure Laterality Date  . ABDOMINAL HYSTERECTOMY    . ANTERIOR LUMBAR FUSION  12/19/2010   L4-5 and L5-S1  . BREAST LUMPECTOMY    . CESAREAN SECTION  1990  . COLONOSCOPY    . LAPAROSCOPIC OVARIAN CYSTECTOMY     post cyst rupture  . LEFT AND RIGHT HEART CATHETERIZATION WITH CORONARY ANGIOGRAM N/A 06/27/2014   Procedure: LEFT AND RIGHT HEART CATHETERIZATION WITH CORONARY ANGIOGRAM;  Surgeon: Corky Crafts, MD;  Location: [facility] CATH LAB;  Service: Cardiovascular;  Laterality: N/A;  . NASAL SINUS SURGERY    . SACROILIAC JOINT FUSION Right 05/17/2015   Procedure: SACROILIAC JOINT FUSION;  Surgeon: Estill Bamberg, MD;  Location: [facility] OR;  Service: Orthopedics;  Laterality: Right;  Right sided sacroiliac joint fusion  . WISDOM TOOTH EXTRACTION      SOCIAL HISTORY: Social History   Tobacco Use  . Smoking status: Never Smoker  . Smokeless tobacco: Never Used  Substance Use Topics  . Alcohol use: Yes    Comment: rare  . Drug use: No    FAMILY HISTORY: Family History  Problem Relation Age of Onset  . Hypertension Mother   . Heart attack Mother 76  . Obesity Mother   . Heart disease Mother   . Heart disease Father   . High blood pressure Father   . Obesity Father   . Colon cancer Brother 45  . Breast cancer Maternal Aunt        dx under 45  . Breast cancer Paternal Aunt        dx under 50  . Colon cancer Paternal Uncle        dx over 72  . Colon cancer Paternal Grandmother        dx over 79  . Breast cancer Maternal Aunt 62  . Colon cancer Paternal Aunt        dx over 49  . Breast cancer Paternal Aunt        dx over 50    ROS: Review of Systems  Constitutional: Positive for malaise/fatigue and weight loss.  Cardiovascular: Negative for chest pain.       Negative for chest pressure.  Gastrointestinal: Negative for nausea and vomiting.  Musculoskeletal:       Negative for muscle weakness.    Neurological: Negative for headaches.    PHYSICAL EXAM: Blood pressure (!) 141/89, pulse 78, temperature 98.1 F (36.7 C), temperature source Oral, height 5\' 8"  (1.727 m), weight 201 lb (91.2 kg), SpO2 97 %. Body mass index is 30.56  kg/m. Physical Exam  Constitutional: She is oriented to person, place, and time. She appears well-developed and well-nourished.  Cardiovascular: Normal rate.  Pulmonary/Chest: Effort normal.  Musculoskeletal: Normal range of motion.  Neurological: She is oriented to person, place, and time.  Skin: Skin is warm and dry.  Psychiatric: She has a normal mood and affect. Her behavior is normal.  Vitals reviewed.   RECENT LABS AND TESTS: BMET    Component Value Date/Time   NA 138 02/22/2018 1242   K 4.3 02/22/2018 1242   CL 99 02/22/2018 1242   CO2 24 02/22/2018 1242   GLUCOSE 81 02/22/2018 1242   GLUCOSE 83 08/23/2016 1223   BUN 8 02/22/2018 1242   CREATININE 0.62 02/22/2018 1242   CALCIUM 8.8 02/22/2018 1242   GFRNONAA 105 02/22/2018 1242   GFRAA 122 02/22/2018 1242   Lab Results  Component Value Date   HGBA1C 5.3 02/22/2018   Lab Results  Component Value Date   INSULIN 6.5 02/22/2018   CBC    Component Value Date/Time   WBC 5.9 02/22/2018 1242   WBC 10.2 08/23/2016 1223   RBC 4.57 02/22/2018 1242   RBC 4.73 08/23/2016 1223   HGB 12.8 02/22/2018 1242   HCT 39.5 02/22/2018 1242   PLT 200 08/23/2016 1223   MCV 86 02/22/2018 1242   MCH 28.0 02/22/2018 1242   MCH 27.5 08/23/2016 1223   MCHC 32.4 02/22/2018 1242   MCHC 32.8 08/23/2016 1223   RDW 13.6 02/22/2018 1242   LYMPHSABS 2.5 02/22/2018 1242   MONOABS 0.4 05/10/2015 1439   EOSABS 0.1 02/22/2018 1242   BASOSABS 0.1 02/22/2018 1242   Iron/TIBC/Ferritin/ %Sat No results found for: IRON, TIBC, FERRITIN, IRONPCTSAT Lipid Panel     Component Value Date/Time   CHOL 218 (H) 02/22/2018 1242   TRIG 47 02/22/2018 1242   HDL 70 02/22/2018 1242   LDLCALC 139 (H) 02/22/2018 1242    Hepatic Function Panel     Component Value Date/Time   PROT 6.9 02/22/2018 1242   ALBUMIN 4.7 02/22/2018 1242   AST 19 02/22/2018 1242   ALT 13 02/22/2018 1242   ALKPHOS 57 02/22/2018 1242   BILITOT 0.2 02/22/2018 1242      Component Value Date/Time   TSH 0.869 02/22/2018 1242   Results for BRIANNAH, LONA (MRN [id number]) as of 03/23/2018 16:41  Ref. Range 02/22/2018 12:42  Vitamin D, 25-Hydroxy Latest Ref Range: 30.0 - 100.0 ng/mL 22.5 (L)   ASSESSMENT AND PLAN: Vitamin D deficiency  Essential hypertension  Class 1 obesity with serious comorbidity and body mass index (BMI) of 30.0 to 30.9 in adult, unspecified obesity type  PLAN:  Vitamin D Deficiency London was informed that low vitamin D levels contributes to fatigue and are associated with obesity, breast, and colon cancer. She agrees to continue to take prescription Vit D @50 ,000 IU every week and will follow up for routine testing of vitamin D, at least 2-3 times per year. She was informed of the risk of over-replacement of vitamin D and agrees to not increase her dose unless she discusses this with [place] first. She agrees to follow up in 4 weeks.  Hypertension We discussed sodium restriction, working on healthy weight loss, and a regular exercise program as the means to achieve improved blood pressure control. We will continue to monitor her blood pressure as well as her progress with the above lifestyle modifications. She will continue her medications as prescribed and will watch for signs of hypotension as  she continues her lifestyle modifications. We will follow up her blood pressure at the next visit in 4 weeks.  I spent > than 50% of the 15 minute visit on counseling as documented in the note.  Obesity Inger is currently in the action stage of change. As such, her goal is to continue with weight loss efforts. She has agreed to follow the Category 3 plan or the pescatarian plan. Kaliann has been instructed  to work up to a goal of 150 minutes of combined cardio and strengthening exercise per week for weight loss and overall health benefits. We discussed the following Behavioral Modification Strategies today: increasing lean protein intake, increasing vegetables, work on meal planning and easy cooking plans, and planning for success.  Lovey has agreed to follow up with our clinic in 4 weeks. She was informed of the importance of frequent follow up visits to maximize her success with intensive lifestyle modifications for her multiple health conditions.   OBESITY BEHAVIORAL INTERVENTION VISIT  Today's visit was # 3   Starting weight: 207 lbs Starting date: 02/22/18 Today's weight : Weight: 201 lb (91.2 kg)  Today's date: 03/22/2018 Total lbs lost to date: 6  ASK: We discussed the diagnosis of obesity with Prince Rome today and Cala Bradford agreed to give [place] permission to discuss obesity behavioral modification therapy today.  ASSESS: Lynsey has the diagnosis of obesity and her BMI today is 30.57 Vanessa is in the action stage of change.   ADVISE: Lasheba was educated on the multiple health risks of obesity as well as the benefit of weight loss to improve her health. She was advised of the need for long term treatment and the importance of lifestyle modifications to improve her current health and to decrease her risk of future health problems.  AGREE: Multiple dietary modification options and treatment options were discussed and Jacquelina agreed to follow the recommendations documented in the above note.  ARRANGE: Jordyn was educated on the importance of frequent visits to treat obesity as outlined per CMS and USPSTF guidelines and agreed to schedule her next follow up appointment today.  I, Kirke Corin, am acting as Energy manager for Filbert Schilder, MD  I have reviewed the above documentation for accuracy and completeness, and I agree with the above. - Debbra Riding, MD

## 2018-03-28 DIAGNOSIS — J209 Acute bronchitis, unspecified: Secondary | ICD-10-CM | POA: Diagnosis not present

## 2018-04-04 DIAGNOSIS — R05 Cough: Secondary | ICD-10-CM | POA: Diagnosis not present

## 2018-04-05 ENCOUNTER — Ambulatory Visit (INDEPENDENT_AMBULATORY_CARE_PROVIDER_SITE_OTHER): Payer: Federal, State, Local not specified - PPO | Admitting: Family Medicine

## 2018-04-05 VITALS — BP 160/91 | HR 58 | Temp 98.3°F | Ht 68.0 in | Wt 208.0 lb

## 2018-04-05 DIAGNOSIS — K5909 Other constipation: Secondary | ICD-10-CM | POA: Diagnosis not present

## 2018-04-05 DIAGNOSIS — I10 Essential (primary) hypertension: Secondary | ICD-10-CM

## 2018-04-05 DIAGNOSIS — E669 Obesity, unspecified: Secondary | ICD-10-CM

## 2018-04-05 DIAGNOSIS — E559 Vitamin D deficiency, unspecified: Secondary | ICD-10-CM | POA: Diagnosis not present

## 2018-04-05 DIAGNOSIS — Z6831 Body mass index (BMI) 31.0-31.9, adult: Secondary | ICD-10-CM

## 2018-04-06 NOTE — Progress Notes (Addendum)
Office: [phone]  /  Fax: [phone]   HPI:   Chief Complaint: OBESITY Molly Norton is here to discuss her progress with her obesity treatment plan. She is following the Category 3 plan and is following her eating plan approximately 85 % of the time. She states she is exercising 0 minutes 0 times per week. Molly Norton was placed on Prednisone for cough and bronchitis and has been on albuterol and Tessalon for cough. She tried to follow the plan but said that she didn't eat much, she said that she could not tolerate the yogurt. She ate 2 eggs for breakfast, 2 oz of meat on a sandwich and soup for dinner.  Her weight is 208 lb (94.3 kg) today and has gained 7 lbs since her last visit. She has lost 6 lbs since starting treatment with [place].  Vitamin D deficiency Molly Norton has a diagnosis of vitamin D deficiency. She is stable taking prescription Vit D and denies nausea, vomiting or muscle weakness. Molly Norton does still complain of having some fatigue.   Hypertension Molly Norton is a 52 y.o. female with hypertension.  Molly Norton denies chest pain, chest pressure, HAs or shortness of breath on exertion. She is working weight loss to help control her blood pressure with the goal of decreasing her risk of heart attack and stroke. Molly Norton blood pressure is uncontrolled today, however, she just took BP medicine today.   Constipation Molly Norton notes constipation for the last few weeks, worse since attempting weight loss. She states BM are less frequent. Molly Norton said that she has no bowel movement for sometimes longer than 3 days.    ALLERGIES: Allergies  Allergen Reactions  . Adhesive [Tape]     Steri-strips caused blisters  . Latex Itching and Rash  . Lisinopril     Difficult swallowing  . Relafen [Nabumetone] Swelling  . Valium [Diazepam] Other (See Comments)    Causes hyper activity Has opposite effect ,becomes very agitated  . Codeine Itching  . Other     NO BLOOD OR  BLOOD PRODUCTS  . Robaxin [Methocarbamol] Other (See Comments)    Hospital gave it to her she is unsure of reaction    MEDICATIONS: Current Outpatient Medications on File Prior to Visit  Medication Sig Dispense Refill  . baclofen (LIORESAL) 20 MG tablet Take 1 tablet (20 mg total) by mouth 4 (four) times daily. 120 each 5  . cyclobenzaprine (FLEXERIL) 10 MG tablet Take 1 tablet (10 mg total) by mouth at bedtime. 30 tablet 2  . docusate sodium (COLACE) 100 MG capsule Take 1 capsule (100 mg total) by mouth daily. 30 capsule 0  . furosemide (LASIX) 40 MG tablet Take 40 mg by mouth daily.     [name] gabapentin (NEURONTIN) 100 MG capsule Take two capsules four times a day 240 capsule 3  . hydrALAZINE (APRESOLINE) 50 MG tablet Take 50 mg by mouth 2 (two) times daily.     . metoprolol succinate (TOPROL-XL) 25 MG 24 hr tablet Take 12.5 mg by mouth daily.  6  . oxyCODONE-acetaminophen (PERCOCET) 5-325 MG tablet Take 1 tablet by mouth 2 (two) times daily as needed for severe pain. 60 tablet 0  . pregabalin (LYRICA) 50 MG capsule Take 50 mg by mouth 3 (three) times daily.    [name] spironolactone (ALDACTONE) 25 MG tablet Take 25 mg by mouth daily.    [name] tiZANidine (ZANAFLEX) 4 MG tablet Take 4 mg by mouth every 6 (six) hours as needed for muscle spasms.    [name]  traZODone (DESYREL) 50 MG tablet Take 50 mg by mouth at bedtime.    . Vitamin D, Ergocalciferol, (DRISDOL) 50000 units CAPS capsule Take 1 capsule (50,000 Units total) by mouth every 7 (seven) days. 4 capsule 0  . [DISCONTINUED] DULoxetine (CYMBALTA) 30 MG capsule Take 30 mg by mouth daily.     No current facility-administered medications on file prior to visit.     PAST MEDICAL HISTORY: Past Medical History:  Diagnosis Date  . Anemia   . Cardiomyopathy (HCC) 06/23/2014  . CHF (congestive heart failure) (HCC)   . Chronic lower back pain   . Constipation   . DDD (degenerative disc disease)    at L4-5 and L5-S1  . Essential hypertension 06/23/2014  .  Family history of adverse reaction to anesthesia    my daughter " came out in a rage."  . Family history of breast cancer   . Family history of colon cancer   . Hypertension   . Joint pain   . Obesity (BMI 30-39.9)   . PONV (postoperative nausea and vomiting)   . Swelling of lower leg   . Vitamin D deficiency     PAST SURGICAL HISTORY: Past Surgical History:  Procedure Laterality Date  . ABDOMINAL HYSTERECTOMY    . ANTERIOR LUMBAR FUSION  12/19/2010   L4-5 and L5-S1  . BREAST LUMPECTOMY    . CESAREAN SECTION  1990  . COLONOSCOPY    . LAPAROSCOPIC OVARIAN CYSTECTOMY     post cyst rupture  . LEFT AND RIGHT HEART CATHETERIZATION WITH CORONARY ANGIOGRAM N/A 06/27/2014   Procedure: LEFT AND RIGHT HEART CATHETERIZATION WITH CORONARY ANGIOGRAM;  Surgeon: Corky Crafts, MD;  Location: [facility] CATH LAB;  Service: Cardiovascular;  Laterality: N/A;  . NASAL SINUS SURGERY    . SACROILIAC JOINT FUSION Right 05/17/2015   Procedure: SACROILIAC JOINT FUSION;  Surgeon: Estill Bamberg, MD;  Location: [facility] OR;  Service: Orthopedics;  Laterality: Right;  Right sided sacroiliac joint fusion  . WISDOM TOOTH EXTRACTION      SOCIAL HISTORY: Social History   Tobacco Use  . Smoking status: Never Smoker  . Smokeless tobacco: Never Used  Substance Use Topics  . Alcohol use: Yes    Comment: rare  . Drug use: No    FAMILY HISTORY: Family History  Problem Relation Age of Onset  . Hypertension Mother   . Heart attack Mother 24  . Obesity Mother   . Heart disease Mother   . Heart disease Father   . High blood pressure Father   . Obesity Father   . Colon cancer Brother 32  . Breast cancer Maternal Aunt        dx under 45  . Breast cancer Paternal Aunt        dx under 50  . Colon cancer Paternal Uncle        dx over 31  . Colon cancer Paternal Grandmother        dx over 54  . Breast cancer Maternal Aunt 62  . Colon cancer Paternal Aunt        dx over 31  . Breast cancer Paternal Aunt         dx over 50    ROS: Review of Systems  Constitutional: Positive for malaise/fatigue. Negative for weight loss.  Respiratory: Negative for shortness of breath.   Cardiovascular: Negative for chest pain.       Negative for chest pressure  Gastrointestinal: Positive for constipation. Negative for nausea and  vomiting.  Musculoskeletal:       Negative for muscle weakness  Neurological: Negative for headaches.    PHYSICAL EXAM: Blood pressure (!) 160/91, pulse (!) 58, temperature 98.3 F (36.8 C), temperature source Oral, height 5\' 8"  (1.727 m), weight 208 lb (94.3 kg), SpO2 98 %. Body mass index is 31.63 kg/m. Physical Exam  Constitutional: She is oriented to person, place, and time. She appears well-developed and well-nourished.  Cardiovascular: Normal rate.  Pulmonary/Chest: Effort normal.  Musculoskeletal: Normal range of motion.  Neurological: She is alert and oriented to person, place, and time.  Skin: Skin is warm and dry.  Psychiatric: She has a normal mood and affect. Her behavior is normal.  Vitals reviewed.   RECENT LABS AND TESTS: BMET    Component Value Date/Time   NA 138 02/22/2018 1242   K 4.3 02/22/2018 1242   CL 99 02/22/2018 1242   CO2 24 02/22/2018 1242   GLUCOSE 81 02/22/2018 1242   GLUCOSE 83 08/23/2016 1223   BUN 8 02/22/2018 1242   CREATININE 0.62 02/22/2018 1242   CALCIUM 8.8 02/22/2018 1242   GFRNONAA 105 02/22/2018 1242   GFRAA 122 02/22/2018 1242   Lab Results  Component Value Date   HGBA1C 5.3 02/22/2018   Lab Results  Component Value Date   INSULIN 6.5 02/22/2018   CBC    Component Value Date/Time   WBC 5.9 02/22/2018 1242   WBC 10.2 08/23/2016 1223   RBC 4.57 02/22/2018 1242   RBC 4.73 08/23/2016 1223   HGB 12.8 02/22/2018 1242   HCT 39.5 02/22/2018 1242   PLT 200 08/23/2016 1223   MCV 86 02/22/2018 1242   MCH 28.0 02/22/2018 1242   MCH 27.5 08/23/2016 1223   MCHC 32.4 02/22/2018 1242   MCHC 32.8 08/23/2016 1223   RDW 13.6  02/22/2018 1242   LYMPHSABS 2.5 02/22/2018 1242   MONOABS 0.4 05/10/2015 1439   EOSABS 0.1 02/22/2018 1242   BASOSABS 0.1 02/22/2018 1242   Iron/TIBC/Ferritin/ %Sat No results found for: IRON, TIBC, FERRITIN, IRONPCTSAT Lipid Panel     Component Value Date/Time   CHOL 218 (H) 02/22/2018 1242   TRIG 47 02/22/2018 1242   HDL 70 02/22/2018 1242   LDLCALC 139 (H) 02/22/2018 1242   Hepatic Function Panel     Component Value Date/Time   PROT 6.9 02/22/2018 1242   ALBUMIN 4.7 02/22/2018 1242   AST 19 02/22/2018 1242   ALT 13 02/22/2018 1242   ALKPHOS 57 02/22/2018 1242   BILITOT 0.2 02/22/2018 1242      Component Value Date/Time   TSH 0.869 02/22/2018 1242   Results for KEYUNDRA, FANT (MRN [id number]) as of 04/06/2018 11:06  Ref. Range 02/22/2018 12:42  Vitamin D, 25-Hydroxy Latest Ref Range: 30.0 - 100.0 ng/mL 22.5 (L)   ASSESSMENT AND PLAN: Vitamin D deficiency  Essential hypertension  Other constipation  Class 1 obesity with serious comorbidity and body mass index (BMI) of 31.0 to 31.9 in adult, unspecified obesity type  PLAN: Vitamin D Deficiency Molly Norton was informed that low vitamin D levels contributes to fatigue and are associated with obesity, breast, and colon cancer. She agrees to continue taking prescription Vit D @50 ,000 IU every week and will follow up for routine testing of vitamin D, at least 2-3 times per year. She was informed of the risk of over-replacement of vitamin D and agrees to not increase her dose unless she discusses this with [place] first.Molly Norton agrees to follow up with our office  in 2 weeks.  Hypertension We discussed sodium restriction, working on healthy weight loss, and a regular exercise program as the means to achieve improved blood pressure control. Molly Norton agreed with this plan and agreed to follow up as directed. We will continue to monitor her blood pressure as well as her progress with the above lifestyle modifications. She will  continue her medications as prescribed and will watch for signs of hypotension as she continues her lifestyle modifications. Molly Norton agrees to follow up with our office in 2 weeks.   Constipation Molly Norton was informed decrease bowel movement frequency is normal while losing weight, but stools should not be hard or painful. She was advised to increase her H20 intake and work on increasing her fiber intake. High fiber foods were discussed today.  She was encouraged to discuss constipation with [facility] who is prescribing her opioids for further management.  Obesity Molly Norton is not currently in the action stage of change. As such, her goal is to continue with weight loss efforts She has agreed to follow the Category 3 plan Molly Norton has been instructed to work up to a goal of 150 minutes of combined cardio and strengthening exercise per week for weight loss and overall health benefits. We discussed the following Behavioral Modification Strategies today: increasing lean protein intake, increasing vegetables, planning for success and work on meal planning and easy cooking plans  Molly Norton has agreed to follow up with our clinic in 2 weeks. She was informed of the importance of frequent follow up visits to maximize her success with intensive lifestyle modifications for her multiple health conditions.   OBESITY BEHAVIORAL INTERVENTION VISIT  Today's visit was # 4   Starting weight: 207 lbs Starting date: 02/22/2018 Today's weight : Weight: 208 lb (94.3 kg)  Today's date: 04/05/2018 Total lbs lost to date: 6 lbs   ASK: We discussed the diagnosis of obesity with Molly Norton today and Cala Bradford agreed to give [place] permission to discuss obesity behavioral modification therapy today.  ASSESS: Iara has the diagnosis of obesity and her BMI today is 31.63 Eufemia is not in the action stage of change   ADVISE: Dai was educated on the multiple health risks of obesity as  well as the benefit of weight loss to improve her health. She was advised of the need for long term treatment and the importance of lifestyle modifications to improve her current health and to decrease her risk of future health problems.  AGREE: Multiple dietary modification options and treatment options were discussed and  Maylen agreed to follow the recommendations documented in the above note.  ARRANGE: Laira was educated on the importance of frequent visits to treat obesity as outlined per CMS and USPSTF guidelines and agreed to schedule her next follow up appointment today.  I, Ellery Plunk, CMA, am acting as transcriptionist for Geryl Councilman, MD  I have reviewed the above documentation for accuracy and completeness, and I agree with the above. - Debbra Riding, MD

## 2018-04-23 ENCOUNTER — Telehealth: Payer: Self-pay | Admitting: Cardiology

## 2018-04-23 NOTE — Telephone Encounter (Signed)
LAM to call back to schedule f/up appt in January 2020

## 2018-04-29 ENCOUNTER — Ambulatory Visit (INDEPENDENT_AMBULATORY_CARE_PROVIDER_SITE_OTHER): Payer: Federal, State, Local not specified - PPO | Admitting: Family Medicine

## 2018-04-29 VITALS — BP 167/99 | HR 62 | Temp 98.3°F | Ht 68.0 in | Wt 201.0 lb

## 2018-04-29 DIAGNOSIS — Z6830 Body mass index (BMI) 30.0-30.9, adult: Secondary | ICD-10-CM | POA: Diagnosis not present

## 2018-04-29 DIAGNOSIS — E7849 Other hyperlipidemia: Secondary | ICD-10-CM | POA: Diagnosis not present

## 2018-04-29 DIAGNOSIS — E559 Vitamin D deficiency, unspecified: Secondary | ICD-10-CM | POA: Diagnosis not present

## 2018-04-29 DIAGNOSIS — E669 Obesity, unspecified: Secondary | ICD-10-CM

## 2018-05-04 NOTE — Progress Notes (Signed)
Office: [phone]  /  Fax: [phone]   HPI:   Chief Complaint: OBESITY Molly Norton is here to discuss her progress with her obesity treatment plan. She is on the Category 3 plan and is following her eating plan approximately 100 % of the time. She states she is exercising 0 minutes 0 times per week. Molly Norton has done well with weight loss on her Category 3 plan, but she is getting very bored and she would like to look at other options. Her weight is 201 lb (91.2 kg) today and has had a weight loss of 7 pounds over a period of 3 to 4 weeks since her last visit. She has lost 6 lbs since starting treatment with [place].  Vitamin D deficiency Molly Norton has a diagnosis of vitamin D deficiency. She is currently taking OTC vit D and her last level was not at goal. She denies nausea, vomiting or muscle weakness.  Pure Hyperlipidemia Molly Norton has hyperlipidemia and she has been attempting to control her cholesterol levels with intensive lifestyle modification including a low saturated fat diet, exercise and weight loss. She denies any chest pain.  ALLERGIES: Allergies  Allergen Reactions  . Adhesive [Tape]     Steri-strips caused blisters  . Latex Itching and Rash  . Lisinopril     Difficult swallowing  . Relafen [Nabumetone] Swelling  . Valium [Diazepam] Other (See Comments)    Causes hyper activity Has opposite effect ,becomes very agitated  . Codeine Itching  . Other     NO BLOOD OR BLOOD PRODUCTS  . Robaxin [Methocarbamol] Other (See Comments)    Hospital gave it to her she is unsure of reaction    MEDICATIONS: Current Outpatient Medications on File Prior to Visit  Medication Sig Dispense Refill  . baclofen (LIORESAL) 20 MG tablet Take 1 tablet (20 mg total) by mouth 4 (four) times daily. 120 each 5  . cyclobenzaprine (FLEXERIL) 10 MG tablet Take 1 tablet (10 mg total) by mouth at bedtime. 30 tablet 2  . docusate sodium (COLACE) 100 MG capsule Take 1 capsule (100 mg total) by  mouth daily. 30 capsule 0  . furosemide (LASIX) 40 MG tablet Take 40 mg by mouth daily.     [name] gabapentin (NEURONTIN) 100 MG capsule Take two capsules four times a day 240 capsule 3  . hydrALAZINE (APRESOLINE) 50 MG tablet Take 50 mg by mouth 2 (two) times daily.     . metoprolol succinate (TOPROL-XL) 25 MG 24 hr tablet Take 12.5 mg by mouth daily.  6  . oxyCODONE-acetaminophen (PERCOCET) 5-325 MG tablet Take 1 tablet by mouth 2 (two) times daily as needed for severe pain. 60 tablet 0  . pregabalin (LYRICA) 50 MG capsule Take 50 mg by mouth 3 (three) times daily.    [name] spironolactone (ALDACTONE) 25 MG tablet Take 25 mg by mouth daily.    [name] tiZANidine (ZANAFLEX) 4 MG tablet Take 4 mg by mouth every 6 (six) hours as needed for muscle spasms.    . traZODone (DESYREL) 50 MG tablet Take 50 mg by mouth at bedtime.    . Vitamin D, Ergocalciferol, (DRISDOL) 50000 units CAPS capsule Take 1 capsule (50,000 Units total) by mouth every 7 (seven) days. 4 capsule 0  . [DISCONTINUED] DULoxetine (CYMBALTA) 30 MG capsule Take 30 mg by mouth daily.     No current facility-administered medications on file prior to visit.     PAST MEDICAL HISTORY: Past Medical History:  Diagnosis Date  . Anemia   .  Cardiomyopathy (HCC) 06/23/2014  . CHF (congestive heart failure) (HCC)   . Chronic lower back pain   . Constipation   . DDD (degenerative disc disease)    at L4-5 and L5-S1  . Essential hypertension 06/23/2014  . Family history of adverse reaction to anesthesia    Molly Norton " came out in a rage."  . Family history of breast cancer   . Family history of colon cancer   . Hypertension   . Joint pain   . Obesity (BMI 30-39.9)   . PONV (postoperative nausea and vomiting)   . Swelling of lower leg   . Vitamin D deficiency     PAST SURGICAL HISTORY: Past Surgical History:  Procedure Laterality Date  . ABDOMINAL HYSTERECTOMY    . ANTERIOR LUMBAR FUSION  12/19/2010   L4-5 and L5-S1  . BREAST LUMPECTOMY    .  CESAREAN SECTION  1990  . COLONOSCOPY    . LAPAROSCOPIC OVARIAN CYSTECTOMY     post cyst rupture  . LEFT AND RIGHT HEART CATHETERIZATION WITH CORONARY ANGIOGRAM N/A 06/27/2014   Procedure: LEFT AND RIGHT HEART CATHETERIZATION WITH CORONARY ANGIOGRAM;  Surgeon: Corky Crafts, MD;  Location: [facility] CATH LAB;  Service: Cardiovascular;  Laterality: N/A;  . NASAL SINUS SURGERY    . SACROILIAC JOINT FUSION Right 05/17/2015   Procedure: SACROILIAC JOINT FUSION;  Surgeon: Estill Bamberg, MD;  Location: [facility] OR;  Service: Orthopedics;  Laterality: Right;  Right sided sacroiliac joint fusion  . WISDOM TOOTH EXTRACTION      SOCIAL HISTORY: Social History   Tobacco Use  . Smoking status: Never Smoker  . Smokeless tobacco: Never Used  Substance Use Topics  . Alcohol use: Yes    Comment: rare  . Drug use: No    FAMILY HISTORY: Family History  Problem Relation Age of Onset  . Hypertension Mother   . Heart attack Mother 55  . Obesity Mother   . Heart disease Mother   . Heart disease Father   . High blood pressure Father   . Obesity Father   . Colon cancer Brother 72  . Breast cancer Maternal Aunt        dx under 45  . Breast cancer Paternal Aunt        dx under 50  . Colon cancer Paternal Uncle        dx over 76  . Colon cancer Paternal Grandmother        dx over 87  . Breast cancer Maternal Aunt 62  . Colon cancer Paternal Aunt        dx over 28  . Breast cancer Paternal Aunt        dx over 50    ROS: Review of Systems  Constitutional: Positive for weight loss.  Cardiovascular: Negative for chest pain.  Gastrointestinal: Negative for nausea and vomiting.  Musculoskeletal:       Negative for muscle weakness    PHYSICAL EXAM: Blood pressure (!) 167/99, pulse 62, temperature 98.3 F (36.8 C), temperature source Oral, height 5\' 8"  (1.727 m), weight 201 lb (91.2 kg), SpO2 99 %. Body mass index is 30.56 kg/m. Physical Exam  Constitutional: She is oriented to person, place,  and time. She appears well-developed and well-nourished.  Cardiovascular: Normal rate.  Pulmonary/Chest: Effort normal.  Musculoskeletal: Normal range of motion.  Neurological: She is oriented to person, place, and time.  Skin: Skin is warm and dry.  Psychiatric: She has a normal mood and affect. Her  behavior is normal.  Vitals reviewed.   RECENT LABS AND TESTS: BMET    Component Value Date/Time   NA 138 02/22/2018 1242   K 4.3 02/22/2018 1242   CL 99 02/22/2018 1242   CO2 24 02/22/2018 1242   GLUCOSE 81 02/22/2018 1242   GLUCOSE 83 08/23/2016 1223   BUN 8 02/22/2018 1242   CREATININE 0.62 02/22/2018 1242   CALCIUM 8.8 02/22/2018 1242   GFRNONAA 105 02/22/2018 1242   GFRAA 122 02/22/2018 1242   Lab Results  Component Value Date   HGBA1C 5.3 02/22/2018   Lab Results  Component Value Date   INSULIN 6.5 02/22/2018   CBC    Component Value Date/Time   WBC 5.9 02/22/2018 1242   WBC 10.2 08/23/2016 1223   RBC 4.57 02/22/2018 1242   RBC 4.73 08/23/2016 1223   HGB 12.8 02/22/2018 1242   HCT 39.5 02/22/2018 1242   PLT 200 08/23/2016 1223   MCV 86 02/22/2018 1242   MCH 28.0 02/22/2018 1242   MCH 27.5 08/23/2016 1223   MCHC 32.4 02/22/2018 1242   MCHC 32.8 08/23/2016 1223   RDW 13.6 02/22/2018 1242   LYMPHSABS 2.5 02/22/2018 1242   MONOABS 0.4 05/10/2015 1439   EOSABS 0.1 02/22/2018 1242   BASOSABS 0.1 02/22/2018 1242   Iron/TIBC/Ferritin/ %Sat No results found for: IRON, TIBC, FERRITIN, IRONPCTSAT Lipid Panel     Component Value Date/Time   CHOL 218 (H) 02/22/2018 1242   TRIG 47 02/22/2018 1242   HDL 70 02/22/2018 1242   LDLCALC 139 (H) 02/22/2018 1242   Hepatic Function Panel     Component Value Date/Time   PROT 6.9 02/22/2018 1242   ALBUMIN 4.7 02/22/2018 1242   AST 19 02/22/2018 1242   ALT 13 02/22/2018 1242   ALKPHOS 57 02/22/2018 1242   BILITOT 0.2 02/22/2018 1242      Component Value Date/Time   TSH 0.869 02/22/2018 1242   Results for  Prince RomeCUTHBERT, Ghislaine K (MRN [id number]) as of 05/04/2018 16:21  Ref. Range 02/22/2018 12:42  Vitamin D, 25-Hydroxy Latest Ref Range: 30.0 - 100.0 ng/mL 22.5 (L)   ASSESSMENT AND PLAN: Vitamin D deficiency  Other hyperlipidemia  Class 1 obesity with serious comorbidity and body mass index (BMI) of 30.0 to 30.9 in adult, unspecified obesity type  PLAN:  Vitamin D Deficiency Cala BradfordKimberly was informed that low vitamin D levels contributes to fatigue and are associated with obesity, breast, and colon cancer. She will continue to take OTC vitamin D and will follow up for routine testing of vitamin D, at least 2-3 times per year. She was informed of the risk of over-replacement of vitamin D and agrees to not increase her dose unless she discusses this with us first. We will recheck labs at the next visit.  Pure Hyperlipidemia Cala BradfordKimberly was informed of the American Heart Association Guidelines emphasizing intensive lifestyle modifications as the first line treatment for hyperlipidemia. We discussed many lifestyle modifications today in depth, and Cala BradfordKimberly will continue to work on decreasing saturated fats such as fatty red meat, butter and many fried foods. She will also increase vegetables and lean protein in her diet and continue to work on exercise and weight loss efforts. We will recheck labs at the next visit.  I spent > than 50% of the 25 minute visit on counseling as documented in the note.  Obesity Cala BradfordKimberly is currently in the action stage of change. As such, her goal is to continue with weight loss efforts She has  agreed to change to keeping a food journal with 1400 to 1600 calories and 85+ grams of protein daily Mauriana has been instructed to work up to a goal of 150 minutes of combined cardio and strengthening exercise per week for weight loss and overall health benefits. We discussed the following Behavioral Modification Strategies today: increasing lean protein intake, decreasing simple  carbohydrates, work on meal planning and easy cooking plans, holiday eating strategies and travel eating strategies  Janette has agreed to follow up with our clinic in 6 to 8 weeks. She was informed of the importance of frequent follow up visits to maximize her success with intensive lifestyle modifications for her multiple health conditions.   OBESITY BEHAVIORAL INTERVENTION VISIT  Today's visit was # 5   Starting weight: 207 lbs Starting date: 02/22/2018 Today's weight : 201 lbs Today's date: 04/29/2018 Total lbs lost to date: 6   ASK: We discussed the diagnosis of obesity with Prince Rome today and Cala Bradford agreed to give [place] permission to discuss obesity behavioral modification therapy today.  ASSESS: Tiombe has the diagnosis of obesity and her BMI today is 30.57 Shylee is in the action stage of change   ADVISE: Prarthana was educated on the multiple health risks of obesity as well as the benefit of weight loss to improve her health. She was advised of the need for long term treatment and the importance of lifestyle modifications to improve her current health and to decrease her risk of future health problems.  AGREE: Multiple dietary modification options and treatment options were discussed and  Lajada agreed to follow the recommendations documented in the above note.  ARRANGE: Arayla was educated on the importance of frequent visits to treat obesity as outlined per CMS and USPSTF guidelines and agreed to schedule her next follow up appointment today.  I, Nevada Crane, am acting as transcriptionist for Quillian Quince, MD  I have reviewed the above documentation for accuracy and completeness, and I agree with the above. -Quillian Quince, MD

## 2018-05-11 ENCOUNTER — Encounter (INDEPENDENT_AMBULATORY_CARE_PROVIDER_SITE_OTHER): Payer: Self-pay | Admitting: Family Medicine

## 2018-06-23 ENCOUNTER — Ambulatory Visit (INDEPENDENT_AMBULATORY_CARE_PROVIDER_SITE_OTHER): Payer: Federal, State, Local not specified - PPO | Admitting: Family Medicine

## 2018-06-23 ENCOUNTER — Encounter (INDEPENDENT_AMBULATORY_CARE_PROVIDER_SITE_OTHER): Payer: Self-pay | Admitting: Family Medicine

## 2018-06-23 VITALS — BP 93/62 | HR 67 | Temp 97.5°F | Ht 68.0 in | Wt 196.0 lb

## 2018-06-23 DIAGNOSIS — E669 Obesity, unspecified: Secondary | ICD-10-CM

## 2018-06-23 DIAGNOSIS — E559 Vitamin D deficiency, unspecified: Secondary | ICD-10-CM | POA: Diagnosis not present

## 2018-06-23 DIAGNOSIS — Z6830 Body mass index (BMI) 30.0-30.9, adult: Secondary | ICD-10-CM | POA: Diagnosis not present

## 2018-06-23 MED ORDER — VITAMIN D (ERGOCALCIFEROL) 1.25 MG (50000 UNIT) PO CAPS: 50000.0000 [IU] | ORAL_CAPSULE | ORAL | 0 refills | Status: DC

## 2018-06-24 NOTE — Progress Notes (Signed)
Office: [phone]  /  Fax: [phone]   HPI:   Chief Complaint: OBESITY Molly Norton is here to discuss her progress with her obesity treatment plan. She is on the Category 3 plan and vegetarian plan and is following her eating plan approximately 80 % of the time. When quizzed further, she is not following either of the plans and mostly trying to portion control.  She states she is exercising 0 minutes 0 times per week. Molly Norton has done well with weight loss even over the holidays. She is disappointed she didn't lose more.  Her weight is 196 lb (88.9 kg) today and has had a weight loss of 5 pounds over a period of 8 weeks since her last visit. She has lost 11 lbs since starting treatment with [place].  Vitamin D deficiency Molly Norton has a diagnosis of vitamin D deficiency. She stopped taking vit D and did not realize that she needs to take it for 3 months. Her last level was not at goal. She admits fatigue.  ASSESSMENT AND PLAN:  Vitamin D deficiency - Plan: Vitamin D, Ergocalciferol, (DRISDOL) 1.25 MG (50000 UT) CAPS capsule  Class 1 obesity with serious comorbidity and body mass index (BMI) of 30.0 to 30.9 in adult, unspecified obesity type - starting BMI >30  PLAN:  Vitamin D Deficiency Molly Norton was informed that low vitamin D levels contributes to fatigue and are associated with obesity, breast, and colon cancer. She agrees to take prescription Vit D @50 ,000 IU every week and will follow up for routine testing of vitamin D, at least 2-3 times per year. She was informed of the risk of over-replacement of vitamin D and agrees to not increase her dose unless she discusses this with [place] first. We will recheck labs in 2 months. Molly Norton agrees to follow up in 2 weeks.  I spent > than 50% of the 25 minute visit on counseling as documented in the note.  Obesity Molly Norton is currently in the action stage of change. As such, her goal is to continue with weight loss efforts. We discussed  realistic expectations. She has agreed to keep a food journal with 1500 calories and 85+ grams of protein.  Molly Norton has been instructed to work up to a goal of 150 minutes of combined cardio and strengthening exercise per week for weight loss and overall health benefits. We discussed the following Behavioral Modification Stratagies today: increasing lean protein intake  decrease liquid calories, and keeping a strict food journal.  Molly Norton has agreed to follow up with our clinic in 4 weeks. She was informed of the importance of frequent follow up visits to maximize her success with intensive lifestyle modifications for her multiple health conditions.  ALLERGIES: Allergies  Allergen Reactions  . Adhesive [Tape]     Steri-strips caused blisters  . Latex Itching and Rash  . Lisinopril     Difficult swallowing  . Relafen [Nabumetone] Swelling  . Valium [Diazepam] Other (See Comments)    Causes hyper activity Has opposite effect ,becomes very agitated  . Codeine Itching  . Other     NO BLOOD OR BLOOD PRODUCTS  . Robaxin [Methocarbamol] Other (See Comments)    Hospital gave it to her she is unsure of reaction    MEDICATIONS: Current Outpatient Medications on File Prior to Visit  Medication Sig Dispense Refill  . baclofen (LIORESAL) 20 MG tablet Take 1 tablet (20 mg total) by mouth 4 (four) times daily. 120 each 5  . cyclobenzaprine (FLEXERIL) 10 MG tablet  Take 1 tablet (10 mg total) by mouth at bedtime. 30 tablet 2  . docusate sodium (COLACE) 100 MG capsule Take 1 capsule (100 mg total) by mouth daily. 30 capsule 0  . furosemide (LASIX) 40 MG tablet Take 40 mg by mouth daily.     [name] gabapentin (NEURONTIN) 100 MG capsule Take two capsules four times a day 240 capsule 3  . hydrALAZINE (APRESOLINE) 50 MG tablet Take 50 mg by mouth 2 (two) times daily.     . metoprolol succinate (TOPROL-XL) 25 MG 24 hr tablet Take 12.5 mg by mouth daily.  6  . oxyCODONE-acetaminophen (PERCOCET) 5-325 MG  tablet Take 1 tablet by mouth 2 (two) times daily as needed for severe pain. 60 tablet 0  . pregabalin (LYRICA) 50 MG capsule Take 50 mg by mouth 3 (three) times daily.    [name] spironolactone (ALDACTONE) 25 MG tablet Take 25 mg by mouth daily.    [name] tiZANidine (ZANAFLEX) 4 MG tablet Take 4 mg by mouth every 6 (six) hours as needed for muscle spasms.    . traZODone (DESYREL) 50 MG tablet Take 50 mg by mouth at bedtime.    . [DISCONTINUED] DULoxetine (CYMBALTA) 30 MG capsule Take 30 mg by mouth daily.     No current facility-administered medications on file prior to visit.     PAST MEDICAL HISTORY: Past Medical History:  Diagnosis Date  . Anemia   . Cardiomyopathy (HCC) 06/23/2014  . CHF (congestive heart failure) (HCC)   . Chronic lower back pain   . Constipation   . DDD (degenerative disc disease)    at L4-5 and L5-S1  . Essential hypertension 06/23/2014  . Family history of adverse reaction to anesthesia    my daughter " came out in a rage."  . Family history of breast cancer   . Family history of colon cancer   . Hypertension   . Joint pain   . Obesity (BMI 30-39.9)   . PONV (postoperative nausea and vomiting)   . Swelling of lower leg   . Vitamin D deficiency     PAST SURGICAL HISTORY: Past Surgical History:  Procedure Laterality Date  . ABDOMINAL HYSTERECTOMY    . ANTERIOR LUMBAR FUSION  12/19/2010   L4-5 and L5-S1  . BREAST LUMPECTOMY    . CESAREAN SECTION  1990  . COLONOSCOPY    . LAPAROSCOPIC OVARIAN CYSTECTOMY     post cyst rupture  . LEFT AND RIGHT HEART CATHETERIZATION WITH CORONARY ANGIOGRAM N/A 06/27/2014   Procedure: LEFT AND RIGHT HEART CATHETERIZATION WITH CORONARY ANGIOGRAM;  Surgeon: Corky Crafts, MD;  Location: [facility] CATH LAB;  Service: Cardiovascular;  Laterality: N/A;  . NASAL SINUS SURGERY    . SACROILIAC JOINT FUSION Right 05/17/2015   Procedure: SACROILIAC JOINT FUSION;  Surgeon: Estill Bamberg, MD;  Location: [facility] OR;  Service: Orthopedics;  Laterality:  Right;  Right sided sacroiliac joint fusion  . WISDOM TOOTH EXTRACTION      SOCIAL HISTORY: Social History   Tobacco Use  . Smoking status: Never Smoker  . Smokeless tobacco: Never Used  Substance Use Topics  . Alcohol use: Yes    Comment: rare  . Drug use: No    FAMILY HISTORY: Family History  Problem Relation Age of Onset  . Hypertension Mother   . Heart attack Mother 105  . Obesity Mother   . Heart disease Mother   . Heart disease Father   . High blood pressure Father   . Obesity  Father   . Colon cancer Brother 7244  . Breast cancer Maternal Aunt        dx under 45  . Breast cancer Paternal Aunt        dx under 50  . Colon cancer Paternal Uncle        dx over 5950  . Colon cancer Paternal Grandmother        dx over 9850  . Breast cancer Maternal Aunt 62  . Colon cancer Paternal Aunt        dx over 5050  . Breast cancer Paternal Aunt        dx over 50    ROS: Review of Systems  Constitutional: Positive for malaise/fatigue and weight loss.    PHYSICAL EXAM: Blood pressure 93/62, pulse 67, temperature (!) 97.5 F (36.4 C), temperature source Oral, height 5\' 8"  (1.727 m), weight 196 lb (88.9 kg), SpO2 97 %. Body mass index is 29.8 kg/m. Physical Exam Vitals signs reviewed.  Constitutional:      Appearance: Normal appearance. She is obese.  Cardiovascular:     Rate and Rhythm: Normal rate.  Pulmonary:     Effort: Pulmonary effort is normal.  Musculoskeletal: Normal range of motion.  Skin:    General: Skin is warm and dry.  Neurological:     Mental Status: She is alert and oriented to person, place, and time.  Psychiatric:        Mood and Affect: Mood normal.        Behavior: Behavior normal.     RECENT LABS AND TESTS: BMET    Component Value Date/Time   NA 138 02/22/2018 1242   Norton 4.3 02/22/2018 1242   CL 99 02/22/2018 1242   CO2 24 02/22/2018 1242   GLUCOSE 81 02/22/2018 1242   GLUCOSE 83 08/23/2016 1223   BUN 8 02/22/2018 1242   CREATININE 0.62  02/22/2018 1242   CALCIUM 8.8 02/22/2018 1242   GFRNONAA 105 02/22/2018 1242   GFRAA 122 02/22/2018 1242   Lab Results  Component Value Date   HGBA1C 5.3 02/22/2018   Lab Results  Component Value Date   INSULIN 6.5 02/22/2018   CBC    Component Value Date/Time   WBC 5.9 02/22/2018 1242   WBC 10.2 08/23/2016 1223   RBC 4.57 02/22/2018 1242   RBC 4.73 08/23/2016 1223   HGB 12.8 02/22/2018 1242   HCT 39.5 02/22/2018 1242   PLT 200 08/23/2016 1223   MCV 86 02/22/2018 1242   MCH 28.0 02/22/2018 1242   MCH 27.5 08/23/2016 1223   MCHC 32.4 02/22/2018 1242   MCHC 32.8 08/23/2016 1223   RDW 13.6 02/22/2018 1242   LYMPHSABS 2.5 02/22/2018 1242   MONOABS 0.4 05/10/2015 1439   EOSABS 0.1 02/22/2018 1242   BASOSABS 0.1 02/22/2018 1242   Iron/TIBC/Ferritin/ %Sat No results found for: IRON, TIBC, FERRITIN, IRONPCTSAT Lipid Panel     Component Value Date/Time   CHOL 218 (H) 02/22/2018 1242   TRIG 47 02/22/2018 1242   HDL 70 02/22/2018 1242   LDLCALC 139 (H) 02/22/2018 1242   Hepatic Function Panel     Component Value Date/Time   PROT 6.9 02/22/2018 1242   ALBUMIN 4.7 02/22/2018 1242   AST 19 02/22/2018 1242   ALT 13 02/22/2018 1242   ALKPHOS 57 02/22/2018 1242   BILITOT 0.2 02/22/2018 1242      Component Value Date/Time   TSH 0.869 02/22/2018 1242   Results for Molly Norton, Molly Norton (MRN [id number])  as of 06/24/2018 08:18  Ref. Range 02/22/2018 12:42  Vitamin D, 25-Hydroxy Latest Ref Range: 30.0 - 100.0 ng/mL 22.5 (L)     OBESITY BEHAVIORAL INTERVENTION VISIT  Today's visit was # 6  Starting weight: 207 lbs Starting date: 02/22/18 Today's weight : Weight: 196 lb (88.9 kg)  Today's date: 06/23/2018 Total lbs lost to date: 7211  ASK: We discussed the diagnosis of obesity with Molly Norton today and Molly Norton agreed to give us permission to discuss obesity behavioral modification therapy today.  ASSESS: Molly Norton has the diagnosis of obesity and her BMI today  is 29.8. Molly Norton is in the action stage of change.   ADVISE: Molly Norton was educated on the multiple health risks of obesity as well as the benefit of weight loss to improve her health. She was advised of the need for long term treatment and the importance of lifestyle modifications to improve her current health and to decrease her risk of future health problems.  AGREE: Multiple dietary modification options and treatment options were discussed and Molly Norton agreed to follow the recommendations documented in the above note.  ARRANGE: Molly Norton was educated on the importance of frequent visits to treat obesity as outlined per CMS and USPSTF guidelines and agreed to schedule her next follow up appointment today.  Launa FlightI, Shiela Bruns, am acting as Energy managertranscriptionist for Wilder Gladearen D. Beasley, MD

## 2018-07-13 ENCOUNTER — Other Ambulatory Visit (INDEPENDENT_AMBULATORY_CARE_PROVIDER_SITE_OTHER): Payer: Self-pay | Admitting: Family Medicine

## 2018-07-13 DIAGNOSIS — E559 Vitamin D deficiency, unspecified: Secondary | ICD-10-CM

## 2018-07-21 ENCOUNTER — Encounter (INDEPENDENT_AMBULATORY_CARE_PROVIDER_SITE_OTHER): Payer: Self-pay

## 2018-07-21 ENCOUNTER — Ambulatory Visit (INDEPENDENT_AMBULATORY_CARE_PROVIDER_SITE_OTHER): Payer: Federal, State, Local not specified - PPO | Admitting: Family Medicine

## 2018-07-22 ENCOUNTER — Ambulatory Visit (INDEPENDENT_AMBULATORY_CARE_PROVIDER_SITE_OTHER): Payer: Federal, State, Local not specified - PPO | Admitting: Bariatrics

## 2018-07-22 ENCOUNTER — Encounter (INDEPENDENT_AMBULATORY_CARE_PROVIDER_SITE_OTHER): Payer: Self-pay | Admitting: Bariatrics

## 2018-07-22 VITALS — BP 132/87 | HR 47 | Temp 98.3°F | Ht 68.0 in | Wt 194.0 lb

## 2018-07-22 DIAGNOSIS — E669 Obesity, unspecified: Secondary | ICD-10-CM

## 2018-07-22 DIAGNOSIS — E559 Vitamin D deficiency, unspecified: Secondary | ICD-10-CM

## 2018-07-22 DIAGNOSIS — Z9189 Other specified personal risk factors, not elsewhere classified: Secondary | ICD-10-CM

## 2018-07-22 DIAGNOSIS — I10 Essential (primary) hypertension: Secondary | ICD-10-CM | POA: Diagnosis not present

## 2018-07-22 DIAGNOSIS — Z6830 Body mass index (BMI) 30.0-30.9, adult: Secondary | ICD-10-CM

## 2018-07-22 MED ORDER — VITAMIN D (ERGOCALCIFEROL) 1.25 MG (50000 UNIT) PO CAPS: 50000.0000 [IU] | ORAL_CAPSULE | ORAL | 0 refills | Status: DC

## 2018-07-26 DIAGNOSIS — E669 Obesity, unspecified: Secondary | ICD-10-CM | POA: Insufficient documentation

## 2018-07-26 DIAGNOSIS — Z6830 Body mass index (BMI) 30.0-30.9, adult: Secondary | ICD-10-CM

## 2018-07-26 NOTE — Progress Notes (Signed)
Office: [phone]  /  Fax: [phone]   HPI:   Chief Complaint: OBESITY Molly Norton is here to discuss her progress with her obesity treatment plan. She is keeping a food journal with 1500 calories and 85 grams of protein and is following her eating plan approximately 80 % of the time. She states she is exercising 0 minutes 0 times per week. Molly Norton is doing well overall. She had oral surgery with a decreased appetite and is not able to eat everything. She is following Category plan 3.  Her weight is 194 lb (88 kg) today and has had a weight loss of 2 pounds over a period of 4 weeks since her last visit. She has lost 13 lbs since starting treatment with [place].  Vitamin D deficiency Molly Norton has a diagnosis of vitamin D deficiency. She is currently taking high dose vit D and denies nausea, vomiting, or muscle weakness.   At risk for osteopenia and osteoporosis Molly Norton is at higher risk of osteopenia and osteoporosis due to vitamin D deficiency.   Hypertension Molly Norton is a 53 y.o. female with hypertension. Eternity's blood pressure is currently well controlled as she is taking hydralazine 50mg , Aldactone 25mg , and Toprol 25mg . She is working on weight loss to help control her blood pressure with the goal of decreasing her risk of heart attack and stroke.     ASSESSMENT AND PLAN:  Vitamin D deficiency - Plan: Vitamin D, Ergocalciferol, (DRISDOL) 1.25 MG (50000 UT) CAPS capsule  Essential hypertension  At risk for osteoporosis  Class 1 obesity with serious comorbidity and body mass index (BMI) of 30.0 to 30.9 in adult, unspecified obesity type  PLAN:  Vitamin D Deficiency Molly Norton was informed that low vitamin D levels contributes to fatigue and are associated with obesity, breast, and colon cancer. Molly Norton agrees to continue to take prescription Vit D @50 ,000 IU every week #4 with no refills and will follow up for routine testing of vitamin D, at least 2-3 times per  year. She was informed of the risk of over-replacement of vitamin D and agrees to not increase her dose unless she discusses this with [place] first. Molly Norton agrees to follow up in 3 to 4 weeks as directed.  At risk for osteopenia and osteoporosis Molly Norton was given extended (15 minutes) osteoporosis prevention counseling today. Molly Norton is at risk for osteopenia and osteoporosis due to her vitamin D deficiency. She was encouraged to take her vitamin D and follow her higher calcium diet and increase strengthening exercise to help strengthen her bones and decrease her risk of osteopenia and osteoporosis.  Hypertension We discussed sodium restriction, working on healthy weight loss, and a regular exercise program as the means to achieve improved blood pressure control. We will continue to monitor her blood pressure as well as her progress with the above lifestyle modifications. She will continue her medications as prescribed and will watch for signs of hypotension as she continues her lifestyle modifications. Molly Norton agreed with this plan and agreed to follow up as directed.  Obesity Molly Norton is currently in the action stage of change. As such, her goal is to continue with weight loss efforts. She has agreed to resume the Category 3 plan and will increase protein over time as she is able to eat solid foods since surgery. She was given Additional Lunch Options. Molly Norton has been instructed to work up to a goal of 150 minutes of combined cardio and strengthening exercise per week for weight loss and overall health benefits.  We discussed the following Behavioral Modification Strategies today: increasing lean protein intake, decreasing simple carbohydrates, increasing vegetables, increase H2O intake, no skipping meals, and work on meal planning and easy cooking plans.  Molly BradfordKimberly has agreed to follow up with our clinic in 3 to 4 weeks for a fasting appointment.. She was informed of the importance of frequent  follow up visits to maximize her success with intensive lifestyle modifications for her multiple health conditions.  ALLERGIES: Allergies  Allergen Reactions  . Adhesive [Tape]     Steri-strips caused blisters  . Latex Itching and Rash  . Lisinopril     Difficult swallowing  . Relafen [Nabumetone] Swelling  . Valium [Diazepam] Other (See Comments)    Causes hyper activity Has opposite effect ,becomes very agitated  . Codeine Itching  . Other     NO BLOOD OR BLOOD PRODUCTS  . Robaxin [Methocarbamol] Other (See Comments)    Hospital gave it to her she is unsure of reaction    MEDICATIONS: Current Outpatient Medications on File Prior to Visit  Medication Sig Dispense Refill  . baclofen (LIORESAL) 20 MG tablet Take 1 tablet (20 mg total) by mouth 4 (four) times daily. 120 each 5  . cyclobenzaprine (FLEXERIL) 10 MG tablet Take 1 tablet (10 mg total) by mouth at bedtime. 30 tablet 2  . docusate sodium (COLACE) 100 MG capsule Take 1 capsule (100 mg total) by mouth daily. 30 capsule 0  . furosemide (LASIX) 40 MG tablet Take 40 mg by mouth daily.     [name]. gabapentin (NEURONTIN) 100 MG capsule Take two capsules four times a day 240 capsule 3  . hydrALAZINE (APRESOLINE) 50 MG tablet Take 50 mg by mouth 2 (two) times daily.     . metoprolol succinate (TOPROL-XL) 25 MG 24 hr tablet Take 12.5 mg by mouth daily.  6  . oxyCODONE-acetaminophen (PERCOCET) 5-325 MG tablet Take 1 tablet by mouth 2 (two) times daily as needed for severe pain. 60 tablet 0  . pregabalin (LYRICA) 50 MG capsule Take 50 mg by mouth 3 (three) times daily.    [name]. spironolactone (ALDACTONE) 25 MG tablet Take 25 mg by mouth daily.    [name]. tiZANidine (ZANAFLEX) 4 MG tablet Take 4 mg by mouth every 6 (six) hours as needed for muscle spasms.    . traZODone (DESYREL) 50 MG tablet Take 50 mg by mouth at bedtime.    . [DISCONTINUED] DULoxetine (CYMBALTA) 30 MG capsule Take 30 mg by mouth daily.     No current facility-administered  medications on file prior to visit.     PAST MEDICAL HISTORY: Past Medical History:  Diagnosis Date  . Anemia   . Cardiomyopathy (HCC) 06/23/2014  . CHF (congestive heart failure) (HCC)   . Chronic lower back pain   . Constipation   . DDD (degenerative disc disease)    at L4-5 and L5-S1  . Essential hypertension 06/23/2014  . Family history of adverse reaction to anesthesia    my daughter " came out in a rage."  . Family history of breast cancer   . Family history of colon cancer   . Hypertension   . Joint pain   . Obesity (BMI 30-39.9)   . PONV (postoperative nausea and vomiting)   . Swelling of lower leg   . Vitamin D deficiency     PAST SURGICAL HISTORY: Past Surgical History:  Procedure Laterality Date  . ABDOMINAL HYSTERECTOMY    . ANTERIOR LUMBAR FUSION  12/19/2010  L4-5 and L5-S1  . BREAST LUMPECTOMY    . CESAREAN SECTION  1990  . COLONOSCOPY    . LAPAROSCOPIC OVARIAN CYSTECTOMY     post cyst rupture  . LEFT AND RIGHT HEART CATHETERIZATION WITH CORONARY ANGIOGRAM N/A 06/27/2014   Procedure: LEFT AND RIGHT HEART CATHETERIZATION WITH CORONARY ANGIOGRAM;  Surgeon: Corky Crafts, MD;  Location: [facility] CATH LAB;  Service: Cardiovascular;  Laterality: N/A;  . NASAL SINUS SURGERY    . SACROILIAC JOINT FUSION Right 05/17/2015   Procedure: SACROILIAC JOINT FUSION;  Surgeon: Estill Bamberg, MD;  Location: [facility] OR;  Service: Orthopedics;  Laterality: Right;  Right sided sacroiliac joint fusion  . WISDOM TOOTH EXTRACTION      SOCIAL HISTORY: Social History   Tobacco Use  . Smoking status: Never Smoker  . Smokeless tobacco: Never Used  Substance Use Topics  . Alcohol use: Yes    Comment: rare  . Drug use: No    FAMILY HISTORY: Family History  Problem Relation Age of Onset  . Hypertension Mother   . Heart attack Mother 8  . Obesity Mother   . Heart disease Mother   . Heart disease Father   . High blood pressure Father   . Obesity Father   . Colon cancer Brother  72  . Breast cancer Maternal Aunt        dx under 45  . Breast cancer Paternal Aunt        dx under 50  . Colon cancer Paternal Uncle        dx over 3  . Colon cancer Paternal Grandmother        dx over 42  . Breast cancer Maternal Aunt 62  . Colon cancer Paternal Aunt        dx over 44  . Breast cancer Paternal Aunt        dx over 50    ROS: Review of Systems  Constitutional: Positive for weight loss.  Gastrointestinal: Negative for nausea and vomiting.  Musculoskeletal:       Negative for muscle weakness.    PHYSICAL EXAM: Blood pressure 132/87, pulse (!) 47, temperature 98.3 F (36.8 C), temperature source Oral, height 5\' 8"  (1.727 m), weight 194 lb (88 kg), SpO2 99 %. Body mass index is 29.5 kg/m. Physical Exam Vitals signs reviewed.  Constitutional:      Appearance: Normal appearance. She is obese.  Cardiovascular:     Rate and Rhythm: Normal rate.  Pulmonary:     Effort: Pulmonary effort is normal.  Musculoskeletal: Normal range of motion.  Skin:    General: Skin is warm and dry.  Neurological:     Mental Status: She is alert and oriented to person, place, and time.  Psychiatric:        Mood and Affect: Mood normal.        Behavior: Behavior normal.     RECENT LABS AND TESTS: BMET    Component Value Date/Time   NA 138 02/22/2018 1242   K 4.3 02/22/2018 1242   CL 99 02/22/2018 1242   CO2 24 02/22/2018 1242   GLUCOSE 81 02/22/2018 1242   GLUCOSE 83 08/23/2016 1223   BUN 8 02/22/2018 1242   CREATININE 0.62 02/22/2018 1242   CALCIUM 8.8 02/22/2018 1242   GFRNONAA 105 02/22/2018 1242   GFRAA 122 02/22/2018 1242   Lab Results  Component Value Date   HGBA1C 5.3 02/22/2018   Lab Results  Component Value Date   INSULIN 6.5  02/22/2018   CBC    Component Value Date/Time   WBC 5.9 02/22/2018 1242   WBC 10.2 08/23/2016 1223   RBC 4.57 02/22/2018 1242   RBC 4.73 08/23/2016 1223   HGB 12.8 02/22/2018 1242   HCT 39.5 02/22/2018 1242   PLT 200  08/23/2016 1223   MCV 86 02/22/2018 1242   MCH 28.0 02/22/2018 1242   MCH 27.5 08/23/2016 1223   MCHC 32.4 02/22/2018 1242   MCHC 32.8 08/23/2016 1223   RDW 13.6 02/22/2018 1242   LYMPHSABS 2.5 02/22/2018 1242   MONOABS 0.4 05/10/2015 1439   EOSABS 0.1 02/22/2018 1242   BASOSABS 0.1 02/22/2018 1242   Iron/TIBC/Ferritin/ %Sat No results found for: IRON, TIBC, FERRITIN, IRONPCTSAT Lipid Panel     Component Value Date/Time   CHOL 218 (H) 02/22/2018 1242   TRIG 47 02/22/2018 1242   HDL 70 02/22/2018 1242   LDLCALC 139 (H) 02/22/2018 1242   Hepatic Function Panel     Component Value Date/Time   PROT 6.9 02/22/2018 1242   ALBUMIN 4.7 02/22/2018 1242   AST 19 02/22/2018 1242   ALT 13 02/22/2018 1242   ALKPHOS 57 02/22/2018 1242   BILITOT 0.2 02/22/2018 1242      Component Value Date/Time   TSH 0.869 02/22/2018 1242   Results for MYSHA, WANER (MRN [id number]) as of 07/26/2018 07:33  Ref. Range 02/22/2018 12:42  Vitamin D, 25-Hydroxy Latest Ref Range: 30.0 - 100.0 ng/mL 22.5 (L)    OBESITY BEHAVIORAL INTERVENTION VISIT  Today's visit was # 7   Starting weight: 207 lbs Starting date: 02/22/18 Today's weight : Weight: 194 lb (88 kg)  Today's date: 07/22/2018 Total lbs lost to date: 13  ASK: We discussed the diagnosis of obesity with Molly Norton today and Molly Norton agreed to give [place] permission to discuss obesity behavioral modification therapy today.  ASSESS: Rhiya has the diagnosis of obesity and her BMI today is 29.5 Averleigh is in the action stage of change.   ADVISE: Chanci was educated on the multiple health risks of obesity as well as the benefit of weight loss to improve her health. She was advised of the need for long term treatment and the importance of lifestyle modifications to improve her current health and to decrease her risk of future health problems.  AGREE: Multiple dietary modification options and treatment options were discussed and  Ahaana agreed to follow the recommendations documented in the above note.  ARRANGE: Travonna was educated on the importance of frequent visits to treat obesity as outlined per CMS and USPSTF guidelines and agreed to schedule her next follow up appointment today.  I, Molly Norton, CMA, am acting as Energy manager for [employer]. Molly Passey, DO  I have reviewed the above documentation for accuracy and completeness, and I agree with the above. -Corinna Capra, DO

## 2018-09-07 ENCOUNTER — Encounter (INDEPENDENT_AMBULATORY_CARE_PROVIDER_SITE_OTHER): Payer: Self-pay

## 2018-09-15 ENCOUNTER — Ambulatory Visit (INDEPENDENT_AMBULATORY_CARE_PROVIDER_SITE_OTHER): Payer: Federal, State, Local not specified - PPO | Admitting: Family Medicine

## 2018-09-15 ENCOUNTER — Encounter (INDEPENDENT_AMBULATORY_CARE_PROVIDER_SITE_OTHER): Payer: Self-pay | Admitting: Family Medicine

## 2018-09-15 ENCOUNTER — Other Ambulatory Visit: Payer: Self-pay

## 2018-09-15 DIAGNOSIS — Z6830 Body mass index (BMI) 30.0-30.9, adult: Secondary | ICD-10-CM

## 2018-09-15 DIAGNOSIS — E669 Obesity, unspecified: Secondary | ICD-10-CM

## 2018-09-15 DIAGNOSIS — J3089 Other allergic rhinitis: Secondary | ICD-10-CM

## 2018-09-15 DIAGNOSIS — E559 Vitamin D deficiency, unspecified: Secondary | ICD-10-CM

## 2018-09-15 MED ORDER — FLUTICASONE PROPIONATE 50 MCG/ACT NA SUSP: 2.0000 | Freq: Every day | NASAL | 0 refills | Status: AC

## 2018-09-15 MED ORDER — VITAMIN D (ERGOCALCIFEROL) 1.25 MG (50000 UNIT) PO CAPS: 50000.0000 [IU] | ORAL_CAPSULE | ORAL | 0 refills | Status: DC

## 2018-09-15 NOTE — Progress Notes (Signed)
Office: [phone]  /  Fax: [phone] TeleHealth Visit:  Molly Norton has verbally consented to this TeleHealth visit today. The patient is located at home, the provider is located at the [employer] office. The participants in this visit include the listed provider and patient. Molly Norton was unable to use Face Time today and the Telehealth visit was conducted via telephone.  HPI:   Chief Complaint: OBESITY Molly Norton is here to discuss her progress with her obesity treatment plan. She is on the Category 3 plan and is following her eating plan approximately 60 % of the time. She states she is exercising 0 minutes 0 times per week. Molly Norton is currently struggling to eat all of her food. She is bored with some of her options especially with fruit. She is not always eating all of her vegetables.   We were unable to weigh the patient today for this TeleHealth visit. She feels as if she has gained weight since her last visit. She has lost 13 lbs since starting treatment with [place].  Vitamin D Deficiency Molly Norton has a diagnosis of vitamin D deficiency. She is currently stable on vit D, but is not yet at goal. Molly Norton denies nausea, vomiting, or muscle weakness.  Allergic Rhinitis Molly Norton has been outside more and notes rhinorrhea and some sneezing. She has had good results on Flonase in the past. Molly Norton denies fever.  ASSESSMENT AND PLAN:  Vitamin D deficiency - Plan: Vitamin D, Ergocalciferol, (DRISDOL) 1.25 MG (50000 UT) CAPS capsule  Allergic rhinitis due to other allergic trigger, unspecified seasonality - Plan: fluticasone (FLONASE) 50 MCG/ACT nasal spray  Class 1 obesity with serious comorbidity and body mass index (BMI) of 30.0 to 30.9 in adult, unspecified obesity type - Starting BMI greater then 30  PLAN:  Vitamin D Deficiency Molly Norton was informed that low vitamin D levels contribute to fatigue and are associated with obesity, breast, and colon cancer.  Molly Norton agrees to continue to take prescription Vit D ,000 IU every week #4 with no refills and will follow up for routine testing of vitamin D, at least 2-3 times per year. She was informed of the risk of over-replacement of vitamin D and agrees to not increase her dose unless she discusses this with [place] first. Molly Norton agrees to follow up in 3 weeks as directed.  Allergic Rhinitis Molly Norton agrees to start Flonase 50 mcg, 2 puffs in each nostril qd #90 days (48 g)  with no refills and she will follow up as directed.  Obesity Molly Norton is currently in the action stage of change. As such, her goal is to continue with weight loss efforts. She has agreed to follow the Category 3 plan. Molly Norton has been instructed to work up to a goal of 150 minutes of combined cardio and strengthening exercise per week for weight loss and overall health benefits. We discussed the following Behavioral Modification Strategies today: increasing vegetables and work on meal planning and easy cooking plans. We discussed better fruit choices. Molly Norton has agreed to follow up with our clinic in 3 weeks. She was informed of the importance of frequent follow up visits to maximize her success with intensive lifestyle modifications for her multiple health conditions.  ALLERGIES: Allergies  Allergen Reactions   Adhesive [Tape]     Steri-strips caused blisters   Latex Itching and Rash   Lisinopril     Difficult swallowing   Relafen [Nabumetone] Swelling   Valium [Diazepam] Other (See Comments)    Causes hyper activity  Has opposite effect ,becomes very agitated   Codeine Itching   Other     NO BLOOD OR BLOOD PRODUCTS   Robaxin [Methocarbamol] Other (See Comments)    Hospital gave it to her she is unsure of reaction    MEDICATIONS: Current Outpatient Medications on File Prior to Visit  Medication Sig Dispense Refill   baclofen (LIORESAL) 20 MG tablet Take 1 tablet (20 mg total) by mouth 4 (four) times  daily. 120 each 5   cyclobenzaprine (FLEXERIL) 10 MG tablet Take 1 tablet (10 mg total) by mouth at bedtime. 30 tablet 2   docusate sodium (COLACE) 100 MG capsule Take 1 capsule (100 mg total) by mouth daily. 30 capsule 0   furosemide (LASIX) 40 MG tablet Take 40 mg by mouth daily.      gabapentin (NEURONTIN) 100 MG capsule Take two capsules four times a day 240 capsule 3   hydrALAZINE (APRESOLINE) 50 MG tablet Take 50 mg by mouth 2 (two) times daily.      metoprolol succinate (TOPROL-XL) 25 MG 24 hr tablet Take 12.5 mg by mouth daily.  6   oxyCODONE-acetaminophen (PERCOCET) 5-325 MG tablet Take 1 tablet by mouth 2 (two) times daily as needed for severe pain. 60 tablet 0   pregabalin (LYRICA) 50 MG capsule Take 50 mg by mouth 3 (three) times daily.     spironolactone (ALDACTONE) 25 MG tablet Take 25 mg by mouth daily.     tiZANidine (ZANAFLEX) 4 MG tablet Take 4 mg by mouth every 6 (six) hours as needed for muscle spasms.     traZODone (DESYREL) 50 MG tablet Take 50 mg by mouth at bedtime.     [DISCONTINUED] DULoxetine (CYMBALTA) 30 MG capsule Take 30 mg by mouth daily.     No current facility-administered medications on file prior to visit.     PAST MEDICAL HISTORY: Past Medical History:  Diagnosis Date   Anemia    Cardiomyopathy (HCC) 06/23/2014   CHF (congestive heart failure) (HCC)    Chronic lower back pain    Constipation    DDD (degenerative disc disease)    at L4-5 and L5-S1   Essential hypertension 06/23/2014   Family history of adverse reaction to anesthesia    my daughter " came out in a rage."   Family history of breast cancer    Family history of colon cancer    Hypertension    Joint pain    Obesity (BMI 30-39.9)    PONV (postoperative nausea and vomiting)    Swelling of lower leg    Vitamin D deficiency     PAST SURGICAL HISTORY: Past Surgical History:  Procedure Laterality Date   ABDOMINAL HYSTERECTOMY     ANTERIOR LUMBAR FUSION   12/19/2010   L4-5 and L5-S1   BREAST LUMPECTOMY     CESAREAN SECTION  1990   COLONOSCOPY     LAPAROSCOPIC OVARIAN CYSTECTOMY     post cyst rupture   LEFT AND RIGHT HEART CATHETERIZATION WITH CORONARY ANGIOGRAM N/A 06/27/2014   Procedure: LEFT AND RIGHT HEART CATHETERIZATION WITH CORONARY ANGIOGRAM;  Surgeon: Corky Crafts, MD;  Location: [facility] CATH LAB;  Service: Cardiovascular;  Laterality: N/A;   NASAL SINUS SURGERY     SACROILIAC JOINT FUSION Right 05/17/2015   Procedure: SACROILIAC JOINT FUSION;  Surgeon: Estill Bamberg, MD;  Location: [facility] OR;  Service: Orthopedics;  Laterality: Right;  Right sided sacroiliac joint fusion   WISDOM TOOTH EXTRACTION      SOCIAL HISTORY:  Social History   Tobacco Use   Smoking status: Never Smoker   Smokeless tobacco: Never Used  Substance Use Topics   Alcohol use: Yes    Comment: rare   Drug use: No    FAMILY HISTORY: Family History  Problem Relation Age of Onset   Hypertension Mother    Heart attack Mother 2059   Obesity Mother    Heart disease Mother    Heart disease Father    High blood pressure Father    Obesity Father    Colon cancer Brother 7644   Breast cancer Maternal Aunt        dx under 5245   Breast cancer Paternal Aunt        dx under 50   Colon cancer Paternal Uncle        dx over 7650   Colon cancer Paternal Grandmother        dx over 8150   Breast cancer Maternal Aunt 62   Colon cancer Paternal Aunt        dx over 3350   Breast cancer Paternal Aunt        dx over 50    ROS: Review of Systems  Constitutional: Negative for fever.  HENT:       Positive for rhinorrhea. Positive for sneezing.  Gastrointestinal: Negative for nausea and vomiting.  Musculoskeletal:       Negative for muscle weakness.    PHYSICAL EXAM: Pt in no acute distress  RECENT LABS AND TESTS: BMET    Component Value Date/Time   NA 138 02/22/2018 1242   K 4.3 02/22/2018 1242   CL 99 02/22/2018 1242   CO2 24 02/22/2018  1242   GLUCOSE 81 02/22/2018 1242   GLUCOSE 83 08/23/2016 1223   BUN 8 02/22/2018 1242   CREATININE 0.62 02/22/2018 1242   CALCIUM 8.8 02/22/2018 1242   GFRNONAA 105 02/22/2018 1242   GFRAA 122 02/22/2018 1242   Lab Results  Component Value Date   HGBA1C 5.3 02/22/2018   Lab Results  Component Value Date   INSULIN 6.5 02/22/2018   CBC    Component Value Date/Time   WBC 5.9 02/22/2018 1242   WBC 10.2 08/23/2016 1223   RBC 4.57 02/22/2018 1242   RBC 4.73 08/23/2016 1223   HGB 12.8 02/22/2018 1242   HCT 39.5 02/22/2018 1242   PLT 200 08/23/2016 1223   MCV 86 02/22/2018 1242   MCH 28.0 02/22/2018 1242   MCH 27.5 08/23/2016 1223   MCHC 32.4 02/22/2018 1242   MCHC 32.8 08/23/2016 1223   RDW 13.6 02/22/2018 1242   LYMPHSABS 2.5 02/22/2018 1242   MONOABS 0.4 05/10/2015 1439   EOSABS 0.1 02/22/2018 1242   BASOSABS 0.1 02/22/2018 1242   Iron/TIBC/Ferritin/ %Sat No results found for: IRON, TIBC, FERRITIN, IRONPCTSAT Lipid Panel     Component Value Date/Time   CHOL 218 (H) 02/22/2018 1242   TRIG 47 02/22/2018 1242   HDL 70 02/22/2018 1242   LDLCALC 139 (H) 02/22/2018 1242   Hepatic Function Panel     Component Value Date/Time   PROT 6.9 02/22/2018 1242   ALBUMIN 4.7 02/22/2018 1242   AST 19 02/22/2018 1242   ALT 13 02/22/2018 1242   ALKPHOS 57 02/22/2018 1242   BILITOT 0.2 02/22/2018 1242      Component Value Date/Time   TSH 0.869 02/22/2018 1242   Results for Prince RomeCUTHBERT, Bruchy K (MRN [id number]) as of 09/15/2018 10:19  Ref. Range 02/22/2018 12:42  Vitamin D,  25-Hydroxy Latest Ref Range: 30.0 - 100.0 ng/mL 22.5 (L)     I, Kirke Corin, CMA, am acting as transcriptionist for Wilder Glade, MD I have reviewed the above documentation for accuracy and completeness, and I agree with the above. -Quillian Quince, MD

## 2018-10-06 ENCOUNTER — Ambulatory Visit (INDEPENDENT_AMBULATORY_CARE_PROVIDER_SITE_OTHER): Payer: Federal, State, Local not specified - PPO | Admitting: Family Medicine

## 2018-10-06 ENCOUNTER — Encounter (INDEPENDENT_AMBULATORY_CARE_PROVIDER_SITE_OTHER): Payer: Self-pay | Admitting: Family Medicine

## 2018-10-06 ENCOUNTER — Other Ambulatory Visit: Payer: Self-pay

## 2018-10-06 DIAGNOSIS — Z6830 Body mass index (BMI) 30.0-30.9, adult: Secondary | ICD-10-CM | POA: Diagnosis not present

## 2018-10-06 DIAGNOSIS — E669 Obesity, unspecified: Secondary | ICD-10-CM | POA: Diagnosis not present

## 2018-10-06 DIAGNOSIS — E559 Vitamin D deficiency, unspecified: Secondary | ICD-10-CM | POA: Diagnosis not present

## 2018-10-06 DIAGNOSIS — I10 Essential (primary) hypertension: Secondary | ICD-10-CM

## 2018-10-06 MED ORDER — VITAMIN D (ERGOCALCIFEROL) 1.25 MG (50000 UNIT) PO CAPS: 50000.0000 [IU] | ORAL_CAPSULE | ORAL | 0 refills | Status: DC

## 2018-10-06 NOTE — Progress Notes (Signed)
Office: [phone]  /  Fax: [phone] TeleHealth Visit:  Molly Norton has verbally consented to this TeleHealth visit today. The patient is located at home, the provider is located at the [facility] office. The participants in this visit include the listed provider and patient. Molly Norton was unable to use Face Time today and the telehealth visit was conducted via telephone.  HPI:   Chief Complaint: OBESITY Molly Norton is here to discuss her progress with her obesity treatment plan. She is on the Category 3 plan and is following her eating plan approximately 80 % of the time. She states she is exercising 0 minutes 0 times per week. Molly Norton feels that she is doing better with her diet prescription and feels that she has maintained her weight. She has controlled her stress eating better and is doing better with meal planning.  We were unable to weigh the patient today for this TeleHealth visit. She feels as if she has maintained weight since her last visit. She has lost 13 lbs since starting treatment with us.  Vitamin D Deficiency Molly Norton has a diagnosis of vitamin D deficiency. She is currently stable on vit D, but is not yet at goal. Molly Norton denies nausea, vomiting, or muscle weakness.  Hypertension Molly RomeKimberly K Kyer is a 53 y.o. female with hypertension. Carel's blood pressure is currently well controlled. She checked at home this morning and it was 110/70. She is working on weight loss to help control her blood pressure with the goal of decreasing her risk of heart attack and stroke. Molly Norton denies chest pain or lightheadedness on Toprol.  ASSESSMENT AND PLAN:  Vitamin D deficiency - Plan: Vitamin D, Ergocalciferol, (DRISDOL) 1.25 MG (50000 UT) CAPS capsule  Essential hypertension  Class 1 obesity with serious comorbidity and body mass index (BMI) of 30.0 to 30.9 in adult, unspecified obesity type - Starting BMI greater then 30  PLAN:  Vitamin D  Deficiency Molly Norton was informed that low vitamin D levels contribute to fatigue and are associated with obesity, breast, and colon cancer. Molly Norton agrees to continue to take prescription Vit D @50 ,000 IU every week #4 with no refills and will follow up for routine testing of vitamin D, at least 2-3 times per year. She was informed of the risk of over-replacement of vitamin D and agrees to not increase her dose unless she discusses this with us first. Molly Norton agrees to follow up in 3 weeks as directed.  Hypertension We discussed sodium restriction, working on healthy weight loss, and a regular exercise program as the means to achieve improved blood pressure control. We will continue to monitor her blood pressure as well as her progress with the above lifestyle modifications. She will continue her diet and current medications as prescribed and will watch for signs of hypotension as she continues her lifestyle modifications. We will continue to monitor and Molly Norton agreed with this plan and agreed to follow up as directed.  Obesity Molly Norton is currently in the action stage of change. As such, her goal is to continue with weight loss efforts. She has agreed to follow the Category 3 plan. Molly Norton has been instructed to work up to a goal of 150 minutes of combined cardio and strengthening exercise per week for weight loss and overall health benefits. We discussed the following Behavioral Modification Strategies today: keeping healthy foods in the home, better snacking choices, ways to avoid boredom eating, and ways to avoid night time snacking.  Molly Norton has agreed to follow up  with our clinic in 3 weeks. She was informed of the importance of frequent follow up visits to maximize her success with intensive lifestyle modifications for her multiple health conditions.  ALLERGIES: Allergies  Allergen Reactions  . Adhesive [Tape]     Steri-strips caused blisters  . Latex Itching and Rash  . Lisinopril      Difficult swallowing  . Relafen [Nabumetone] Swelling  . Valium [Diazepam] Other (See Comments)    Causes hyper activity Has opposite effect ,becomes very agitated  . Codeine Itching  . Other     NO BLOOD OR BLOOD PRODUCTS  . Robaxin [Methocarbamol] Other (See Comments)    Hospital gave it to her she is unsure of reaction    MEDICATIONS: Current Outpatient Medications on File Prior to Visit  Medication Sig Dispense Refill  . baclofen (LIORESAL) 20 MG tablet Take 1 tablet (20 mg total) by mouth 4 (four) times daily. 120 each 5  . cyclobenzaprine (FLEXERIL) 10 MG tablet Take 1 tablet (10 mg total) by mouth at bedtime. 30 tablet 2  . docusate sodium (COLACE) 100 MG capsule Take 1 capsule (100 mg total) by mouth daily. 30 capsule 0  . fluticasone (FLONASE) 50 MCG/ACT nasal spray Place 2 sprays into both nostrils daily. 48 g 0  . furosemide (LASIX) 40 MG tablet Take 40 mg by mouth daily.     [name] gabapentin (NEURONTIN) 100 MG capsule Take two capsules four times a day 240 capsule 3  . hydrALAZINE (APRESOLINE) 50 MG tablet Take 50 mg by mouth 2 (two) times daily.     . metoprolol succinate (TOPROL-XL) 25 MG 24 hr tablet Take 12.5 mg by mouth daily.  6  . oxyCODONE-acetaminophen (PERCOCET) 5-325 MG tablet Take 1 tablet by mouth 2 (two) times daily as needed for severe pain. 60 tablet 0  . pregabalin (LYRICA) 50 MG capsule Take 50 mg by mouth 3 (three) times daily.    [name] spironolactone (ALDACTONE) 25 MG tablet Take 25 mg by mouth daily.    [name] tiZANidine (ZANAFLEX) 4 MG tablet Take 4 mg by mouth every 6 (six) hours as needed for muscle spasms.    . traZODone (DESYREL) 50 MG tablet Take 50 mg by mouth at bedtime.    . [DISCONTINUED] DULoxetine (CYMBALTA) 30 MG capsule Take 30 mg by mouth daily.     No current facility-administered medications on file prior to visit.     PAST MEDICAL HISTORY: Past Medical History:  Diagnosis Date  . Anemia   . Cardiomyopathy (HCC) 06/23/2014  . CHF  (congestive heart failure) (HCC)   . Chronic lower back pain   . Constipation   . DDD (degenerative disc disease)    at L4-5 and L5-S1  . Essential hypertension 06/23/2014  . Family history of adverse reaction to anesthesia    my daughter " came out in a rage."  . Family history of breast cancer   . Family history of colon cancer   . Hypertension   . Joint pain   . Obesity (BMI 30-39.9)   . PONV (postoperative nausea and vomiting)   . Swelling of lower leg   . Vitamin D deficiency     PAST SURGICAL HISTORY: Past Surgical History:  Procedure Laterality Date  . ABDOMINAL HYSTERECTOMY    . ANTERIOR LUMBAR FUSION  12/19/2010   L4-5 and L5-S1  . BREAST LUMPECTOMY    . CESAREAN SECTION  1990  . COLONOSCOPY    . LAPAROSCOPIC OVARIAN CYSTECTOMY  post cyst rupture  . LEFT AND RIGHT HEART CATHETERIZATION WITH CORONARY ANGIOGRAM N/A 06/27/2014   Procedure: LEFT AND RIGHT HEART CATHETERIZATION WITH CORONARY ANGIOGRAM;  Surgeon: Corky Crafts, MD;  [facility] CATH LAB;  Service: Cardiovascular;  Laterality: N/A;  . NASAL SINUS SURGERY    . SACROILIAC JOINT FUSION Right 05/17/2015   Procedure: SACROILIAC JOINT FUSION;  Surgeon: Estill Bamberg, MD;  Location: [facility];  Service: Orthopedics;  Laterality: Right;  Right sided sacroiliac joint fusion  . WISDOM TOOTH EXTRACTION      SOCIAL HISTORY: Social History   Tobacco Use  . Smoking status: Never Smoker  . Smokeless tobacco: Never Used  Substance Use Topics  . Alcohol use: Yes    Comment: rare  . Drug use: No    FAMILY HISTORY: Family History  Problem Relation Age of Onset  . Hypertension Mother   . Heart attack Mother 64  . Obesity Mother   . Heart disease Mother   . Heart disease Father   . High blood pressure Father   . Obesity Father   . Colon cancer Brother 58  . Breast cancer Maternal Aunt        dx under 45  . Breast cancer Paternal Aunt        dx under 50  . Colon cancer Paternal Uncle        dx over 18   . Colon cancer Paternal Grandmother        dx over 72  . Breast cancer Maternal Aunt 62  . Colon cancer Paternal Aunt        dx over 52  . Breast cancer Paternal Aunt        dx over 50    ROS: Review of Systems  Cardiovascular: Negative for chest pain.  Gastrointestinal: Negative for nausea and vomiting.  Musculoskeletal:       Negative for muscle weakness.  Neurological:       Negative for lightheadedness.    PHYSICAL EXAM: Pt in no acute distress  RECENT LABS AND TESTS: BMET    Component Value Date/Time   NA 138 02/22/2018 1242   K 4.3 02/22/2018 1242   CL 99 02/22/2018 1242   CO2 24 02/22/2018 1242   GLUCOSE 81 02/22/2018 1242   GLUCOSE 83 08/23/2016 1223   BUN 8 02/22/2018 1242   CREATININE 0.62 02/22/2018 1242   CALCIUM 8.8 02/22/2018 1242   GFRNONAA 105 02/22/2018 1242   GFRAA 122 02/22/2018 1242   Lab Results  Component Value Date   HGBA1C 5.3 02/22/2018   Lab Results  Component Value Date   INSULIN 6.5 02/22/2018   CBC    Component Value Date/Time   WBC 5.9 02/22/2018 1242   WBC 10.2 08/23/2016 1223   RBC 4.57 02/22/2018 1242   RBC 4.73 08/23/2016 1223   HGB 12.8 02/22/2018 1242   HCT 39.5 02/22/2018 1242   PLT 200 08/23/2016 1223   MCV 86 02/22/2018 1242   MCH 28.0 02/22/2018 1242   MCH 27.5 08/23/2016 1223   MCHC 32.4 02/22/2018 1242   MCHC 32.8 08/23/2016 1223   RDW 13.6 02/22/2018 1242   LYMPHSABS 2.5 02/22/2018 1242   MONOABS 0.4 05/10/2015 1439   EOSABS 0.1 02/22/2018 1242   BASOSABS 0.1 02/22/2018 1242   Iron/TIBC/Ferritin/ %Sat No results found for: IRON, TIBC, FERRITIN, IRONPCTSAT Lipid Panel     Component Value Date/Time   CHOL 218 (H) 02/22/2018 1242   TRIG 47 02/22/2018 1242  HDL 70 02/22/2018 1242   LDLCALC 139 (H) 02/22/2018 1242   Hepatic Function Panel     Component Value Date/Time   PROT 6.9 02/22/2018 1242   ALBUMIN 4.7 02/22/2018 1242   AST 19 02/22/2018 1242   ALT 13 02/22/2018 1242   ALKPHOS 57  02/22/2018 1242   BILITOT 0.2 02/22/2018 1242      Component Value Date/Time   TSH 0.869 02/22/2018 1242   Results for LAMIKA, CONNOLLY (MRN [id number]) as of 10/06/2018 11:16  Ref. Range 02/22/2018 12:42  Vitamin D, 25-Hydroxy Latest Ref Range: 30.0 - 100.0 ng/mL 22.5 (L)     I, Kirke Corin, CMA, am acting as transcriptionist for Wilder Glade, MD I have reviewed the above documentation for accuracy and completeness, and I agree with the above. -Quillian Quince, MD

## 2018-10-27 ENCOUNTER — Ambulatory Visit (INDEPENDENT_AMBULATORY_CARE_PROVIDER_SITE_OTHER): Payer: Federal, State, Local not specified - PPO | Admitting: Family Medicine

## 2018-10-27 ENCOUNTER — Encounter (INDEPENDENT_AMBULATORY_CARE_PROVIDER_SITE_OTHER): Payer: Self-pay | Admitting: Family Medicine

## 2018-10-27 ENCOUNTER — Other Ambulatory Visit: Payer: Self-pay

## 2018-10-27 DIAGNOSIS — K5909 Other constipation: Secondary | ICD-10-CM | POA: Diagnosis not present

## 2018-10-27 DIAGNOSIS — E669 Obesity, unspecified: Secondary | ICD-10-CM | POA: Diagnosis not present

## 2018-10-27 DIAGNOSIS — E8881 Metabolic syndrome: Secondary | ICD-10-CM | POA: Diagnosis not present

## 2018-10-27 DIAGNOSIS — Z6830 Body mass index (BMI) 30.0-30.9, adult: Secondary | ICD-10-CM | POA: Diagnosis not present

## 2018-10-27 MED ORDER — METFORMIN HCL 500 MG PO TABS: 500.0000 mg | ORAL_TABLET | Freq: Every day | ORAL | 0 refills | Status: DC

## 2018-10-27 NOTE — Progress Notes (Signed)
Office: [phone]  /  Fax: [phone] TeleHealth Visit:  Molly Norton has verbally consented to this TeleHealth visit today. The patient is located at home, the provider is located at the [facility] office. The participants in this visit include the listed provider and patient. Torie was unable to use realtime audiovisual technology today and the telehealth visit was conducted via telephone.  HPI:   Chief Complaint: OBESITY Molly Norton is here to discuss her progress with her obesity treatment plan. She is on the Category 3 plan and is following her eating plan approximately 80 % of the time. She states she is exercising 0 minutes 0 times per week. Molly Norton feels that she has gained weight in the last 2 to 3 weeks and feels that she is retaining fluid. She had increased sodium and simple carbs in her diet. Molly Norton asks for an appetite suppressant to help with excessive hunger signals.  We were unable to weigh the patient today for this TeleHealth visit. She feels as if she has gained weight since her last visit. She has lost 13 lbs since starting treatment with [place].  Insulin Resistance Molly Norton has a diagnosis of insulin resistance based on her elevated fasting insulin level >5. Although Tanita's blood glucose readings are still under good control, insulin resistance puts her at greater risk of metabolic syndrome and diabetes. Her labs were within normal limits, but she notes increased polyphagia through the entire day. She is not taking metformin currently and continues to work on diet and exercise to decrease risk of diabetes.  Constipation Molly Norton notes constipation that has been a chronic problem and worse since attempting weight loss. She states BM are less frequent and she is on Miralax as needed.  ASSESSMENT AND PLAN:  Insulin resistance - Plan: metFORMIN (GLUCOPHAGE) 500 MG tablet  Other constipation  Class 1 obesity with serious comorbidity and body  mass index (BMI) of 30.0 to 30.9 in adult, unspecified obesity type - Starting BMI greater then 30  PLAN:  Insulin Resistance Molly Norton will continue to work on weight loss, exercise, and decreasing simple carbohydrates in her diet to help decrease the risk of diabetes. She was informed that eating too many simple carbohydrates or too many calories at one sitting increases the likelihood of GI side effects. Molly Norton agreed to start metformin 500 mg qAM with food #30 with no refills and prescription was written today. Molly Norton agreed to follow her diet prescription strictly and to follow up with [place] as directed to monitor her progress in 3 weeks.  Constipation Molly Norton was informed decrease bowel movement frequency is normal while losing weight, but stools should not be hard or painful. She was advised to increase her H20 intake and work on increasing her fiber intake. High fiber foods were discussed today. Molly Norton was also advised to schedule an appointment with her GI doctor.  I spent > than 50% of the 30 minute visit on counseling as documented in the note.  Obesity Molly Norton is currently in the action stage of change. As such, her goal is to continue with weight loss efforts. She has agreed to follow the Category 3 plan. Damaris has been instructed to work up to a goal of 150 minutes of combined cardio and strengthening exercise per week for weight loss and overall health benefits. We discussed the following Behavioral Modification Strategies today: increasing lean protein intake, no skipping meals, and decreasing simple carbohydrates.   Molly Norton has agreed to follow up with our clinic in 3  weeks. She was informed of the importance of frequent follow up visits to maximize her success with intensive lifestyle modifications for her multiple health conditions.  ALLERGIES: Allergies  Allergen Reactions  . Adhesive [Tape]     Steri-strips caused blisters  . Latex Itching and Rash  .  Lisinopril     Difficult swallowing  . Relafen [Nabumetone] Swelling  . Valium [Diazepam] Other (See Comments)    Causes hyper activity Has opposite effect ,becomes very agitated  . Codeine Itching  . Other     NO BLOOD OR BLOOD PRODUCTS  . Robaxin [Methocarbamol] Other (See Comments)    Hospital gave it to her she is unsure of reaction    MEDICATIONS: Current Outpatient Medications on File Prior to Visit  Medication Sig Dispense Refill  . baclofen (LIORESAL) 20 MG tablet Take 1 tablet (20 mg total) by mouth 4 (four) times daily. 120 each 5  . cyclobenzaprine (FLEXERIL) 10 MG tablet Take 1 tablet (10 mg total) by mouth at bedtime. 30 tablet 2  . docusate sodium (COLACE) 100 MG capsule Take 1 capsule (100 mg total) by mouth daily. 30 capsule 0  . fluticasone (FLONASE) 50 MCG/ACT nasal spray Place 2 sprays into both nostrils daily. 48 g 0  . furosemide (LASIX) 40 MG tablet Take 40 mg by mouth daily.     [name] gabapentin (NEURONTIN) 100 MG capsule Take two capsules four times a day 240 capsule 3  . hydrALAZINE (APRESOLINE) 50 MG tablet Take 50 mg by mouth 2 (two) times daily.     . metoprolol succinate (TOPROL-XL) 25 MG 24 hr tablet Take 12.5 mg by mouth daily.  6  . oxyCODONE-acetaminophen (PERCOCET) 5-325 MG tablet Take 1 tablet by mouth 2 (two) times daily as needed for severe pain. 60 tablet 0  . pregabalin (LYRICA) 50 MG capsule Take 50 mg by mouth 3 (three) times daily.    [name] spironolactone (ALDACTONE) 25 MG tablet Take 25 mg by mouth daily.    [name] tiZANidine (ZANAFLEX) 4 MG tablet Take 4 mg by mouth every 6 (six) hours as needed for muscle spasms.    . traZODone (DESYREL) 50 MG tablet Take 50 mg by mouth at bedtime.    . Vitamin D, Ergocalciferol, (DRISDOL) 1.25 MG (50000 UT) CAPS capsule Take 1 capsule (50,000 Units total) by mouth every 7 (seven) days. 4 capsule 0  . [DISCONTINUED] DULoxetine (CYMBALTA) 30 MG capsule Take 30 mg by mouth daily.     No current facility-administered  medications on file prior to visit.     PAST MEDICAL HISTORY: Past Medical History:  Diagnosis Date  . Anemia   . Cardiomyopathy (HCC) 06/23/2014  . CHF (congestive heart failure) (HCC)   . Chronic lower back pain   . Constipation   . DDD (degenerative disc disease)    at L4-5 and L5-S1  . Essential hypertension 06/23/2014  . Family history of adverse reaction to anesthesia    my daughter " came out in a rage."  . Family history of breast cancer   . Family history of colon cancer   . Hypertension   . Joint pain   . Obesity (BMI 30-39.9)   . PONV (postoperative nausea and vomiting)   . Swelling of lower leg   . Vitamin D deficiency     PAST SURGICAL HISTORY: Past Surgical History:  Procedure Laterality Date  . ABDOMINAL HYSTERECTOMY    . ANTERIOR LUMBAR FUSION  12/19/2010   L4-5 and L5-S1  .  BREAST LUMPECTOMY    . CESAREAN SECTION  1990  . COLONOSCOPY    . LAPAROSCOPIC OVARIAN CYSTECTOMY     post cyst rupture  . LEFT AND RIGHT HEART CATHETERIZATION WITH CORONARY ANGIOGRAM N/A 06/27/2014   Procedure: LEFT AND RIGHT HEART CATHETERIZATION WITH CORONARY ANGIOGRAM;  Surgeon: Corky CraftsJayadeep S Varanasi, MD;  [facility];  Service: Cardiovascular;  Laterality: N/A;  . NASAL SINUS SURGERY    . SACROILIAC JOINT FUSION Right 05/17/2015   Procedure: SACROILIAC JOINT FUSION;  Surgeon: Estill BambergMark Dumonski, MD;  [facility];  Service: Orthopedics;  Laterality: Right;  Right sided sacroiliac joint fusion  . WISDOM TOOTH EXTRACTION      SOCIAL HISTORY: Social History   Tobacco Use  . Smoking status: Never Smoker  . Smokeless tobacco: Never Used  Substance Use Topics  . Alcohol use: Yes    Comment: rare  . Drug use: No    FAMILY HISTORY: Family History  Problem Relation Age of Onset  . Hypertension Mother   . Heart attack Mother [id number]  . Obesity Mother   . Heart disease Mother   . Heart disease Father   . High blood pressure Father   . Obesity Father   . Colon cancer Brother  844  . Breast cancer Maternal Aunt        dx under 45  . Breast cancer Paternal Aunt        dx under 50  . Colon cancer Paternal Uncle        dx over 4350  . Colon cancer Paternal Grandmother        dx over 1850  . Breast cancer Maternal Aunt 62  . Colon cancer Paternal Aunt        dx over 1950  . Breast cancer Paternal Aunt        dx over 50    ROS: Review of Systems  Gastrointestinal: Positive for constipation.  Endo/Heme/Allergies:       Positive for polyphagia.    PHYSICAL EXAM: Pt in no acute distress  RECENT LABS AND TESTS: BMET    Component Value Date/Time   NA 138 02/22/2018 1242   K 4.3 02/22/2018 1242   CL 99 02/22/2018 1242   CO2 24 02/22/2018 1242   GLUCOSE 81 02/22/2018 1242   GLUCOSE 83 08/23/2016 1223   BUN 8 02/22/2018 1242   CREATININE 0.62 02/22/2018 1242   CALCIUM 8.8 02/22/2018 1242   GFRNONAA 105 02/22/2018 1242   GFRAA 122 02/22/2018 1242   Lab Results  Component Value Date   HGBA1C 5.3 02/22/2018   Lab Results  Component Value Date   INSULIN 6.5 02/22/2018   CBC    Component Value Date/Time   WBC 5.9 02/22/2018 1242   WBC 10.2 08/23/2016 1223   RBC 4.57 02/22/2018 1242   RBC 4.73 08/23/2016 1223   HGB 12.8 02/22/2018 1242   HCT 39.5 02/22/2018 1242   PLT 200 08/23/2016 1223   MCV 86 02/22/2018 1242   MCH 28.0 02/22/2018 1242   MCH 27.5 08/23/2016 1223   MCHC 32.4 02/22/2018 1242   MCHC 32.8 08/23/2016 1223   RDW 13.6 02/22/2018 1242   LYMPHSABS 2.5 02/22/2018 1242   MONOABS 0.4 05/10/2015 1439   EOSABS 0.1 02/22/2018 1242   BASOSABS 0.1 02/22/2018 1242   Iron/TIBC/Ferritin/ %Sat No results found for: IRON, TIBC, FERRITIN, IRONPCTSAT Lipid Panel     Component Value Date/Time   CHOL 218 (H) 02/22/2018 1242   TRIG 47  02/22/2018 1242   HDL 70 02/22/2018 1242   LDLCALC 139 (H) 02/22/2018 1242   Hepatic Function Panel     Component Value Date/Time   PROT 6.9 02/22/2018 1242   ALBUMIN 4.7 02/22/2018 1242   AST 19  02/22/2018 1242   ALT 13 02/22/2018 1242   ALKPHOS 57 02/22/2018 1242   BILITOT 0.2 02/22/2018 1242      Component Value Date/Time   TSH 0.869 02/22/2018 1242    Results for KAMARRI, FISCHETTI (MRN [id number]) as of 10/27/2018 16:33  Ref. Range 02/22/2018 12:42  Vitamin D, 25-Hydroxy Latest Ref Range: 30.0 - 100.0 ng/mL 22.5 (L)    I, Kirke Corin, CMA, am acting as transcriptionist for Wilder Glade, MD I have reviewed the above documentation for accuracy and completeness, and I agree with the above. -Quillian Quince, MD

## 2018-11-17 ENCOUNTER — Other Ambulatory Visit: Payer: Self-pay

## 2018-11-17 ENCOUNTER — Ambulatory Visit (INDEPENDENT_AMBULATORY_CARE_PROVIDER_SITE_OTHER): Payer: Federal, State, Local not specified - PPO | Admitting: Bariatrics

## 2018-11-17 DIAGNOSIS — I10 Essential (primary) hypertension: Secondary | ICD-10-CM | POA: Diagnosis not present

## 2018-11-17 DIAGNOSIS — E669 Obesity, unspecified: Secondary | ICD-10-CM

## 2018-11-17 DIAGNOSIS — E559 Vitamin D deficiency, unspecified: Secondary | ICD-10-CM

## 2018-11-17 DIAGNOSIS — Z6830 Body mass index (BMI) 30.0-30.9, adult: Secondary | ICD-10-CM | POA: Diagnosis not present

## 2018-11-18 NOTE — Progress Notes (Signed)
Office: [phone]  /  Fax: [phone] TeleHealth Visit:  Molly Norton has verbally consented to this TeleHealth visit today. The patient is located at home, the provider is located at the [facility] office. The participants in this visit include the listed provider and patient and any and all parties involved. The visit was conducted today via telephone. Molly Norton was unable to use realtime audiovisual technology today and the telehealth visit was conducted via telephone ( 24 minutes ) HPI:   Chief Complaint: OBESITY Molly Norton is here to discuss her progress with her obesity treatment plan. She is on the Category 3 plan and is following her eating plan approximately 80 % of the time. She states she is exercising 0 minutes 0 times per week. Molly Norton is unsure if she has lost weight or not. She states that she is well. We were unable to weigh the patient today for this TeleHealth visit. She feels unsure if she has lost weight since her last visit.   Hypertension Molly Norton is a 53 y.o. female with hypertension. Her blood pressure reading is 110/70. Molly Norton denies chest pain or shortness of breath on exertion. She is working weight loss to help control her blood pressure with the goal of decreasing her risk of heart attack and stroke. [name] blood pressure is well controlled.  Vitamin D deficiency Molly Norton has a diagnosis of vitamin D deficiency. Molly Norton is taking high dose vit D and she denies nausea, vomiting or muscle weakness.  ASSESSMENT AND PLAN:  Essential hypertension  Vitamin D deficiency  Class 1 obesity with serious comorbidity and body mass index (BMI) of 30.0 to 30.9 in adult, unspecified obesity type - Starting BMI greater then 30  PLAN:  Hypertension We discussed sodium restriction, working on healthy weight loss, and a regular exercise program as the means to achieve improved blood pressure control. Molly Norton agreed with  this plan and agreed to follow up as directed. We will continue to monitor her blood pressure as well as her progress with the above lifestyle modifications. She will continue her medications as prescribed and will watch for signs of hypotension as she continues her lifestyle modifications.  Vitamin D Deficiency Molly Norton was informed that low vitamin D levels contributes to fatigue and are associated with obesity, breast, and colon cancer. She agrees to continue to take prescription Vit D @50 ,000 IU every week and will follow up for routine testing of vitamin D, at least 2-3 times per year. She was informed of the risk of over-replacement of vitamin D and agrees to not increase her dose unless she discusses this with us first.  Obesity Molly Norton is currently in the action stage of change. As such, her goal is to continue with weight loss efforts She has agreed to keep a food journal with 450 to 600 calories and 40+ grams of protein at supper daily and follow the Category 3 plan Molly Norton is limited in exercise, due to crutches and wheelchair. She will continue to get into the pool (walking) for weight loss and overall health benefits. We discussed the following Behavioral Modification Strategies today: planning for success, increase H2O intake, no skipping meals, keeping healthy foods in the home, increasing lean protein intake, decreasing simple carbohydrates, increasing vegetables, decrease eating out and work on meal planning and easy cooking plans Molly Norton will weigh herself at home.  Molly Norton has agreed to follow up with our clinic in 2 weeks. She was informed of the importance of frequent follow  up visits to maximize her success with intensive lifestyle modifications for her multiple health conditions.  ALLERGIES: Allergies  Allergen Reactions  . Adhesive [Tape]     Steri-strips caused blisters  . Latex Itching and Rash  . Lisinopril     Difficult swallowing  . Relafen [Nabumetone]  Swelling  . Valium [Diazepam] Other (See Comments)    Causes hyper activity Has opposite effect ,becomes very agitated  . Codeine Itching  . Other     NO BLOOD OR BLOOD PRODUCTS  . Robaxin [Methocarbamol] Other (See Comments)    Hospital gave it to her she is unsure of reaction    MEDICATIONS: Current Outpatient Medications on File Prior to Visit  Medication Sig Dispense Refill  . baclofen (LIORESAL) 20 MG tablet Take 1 tablet (20 mg total) by mouth 4 (four) times daily. 120 each 5  . cyclobenzaprine (FLEXERIL) 10 MG tablet Take 1 tablet (10 mg total) by mouth at bedtime. 30 tablet 2  . docusate sodium (COLACE) 100 MG capsule Take 1 capsule (100 mg total) by mouth daily. 30 capsule 0  . fluticasone (FLONASE) 50 MCG/ACT nasal spray Place 2 sprays into both nostrils daily. 48 g 0  . furosemide (LASIX) 40 MG tablet Take 40 mg by mouth daily.     [name] gabapentin (NEURONTIN) 100 MG capsule Take two capsules four times a day 240 capsule 3  . hydrALAZINE (APRESOLINE) 50 MG tablet Take 50 mg by mouth 2 (two) times daily.     . metFORMIN (GLUCOPHAGE) 500 MG tablet Take 1 tablet (500 mg total) by mouth daily with breakfast. 30 tablet 0  . metoprolol succinate (TOPROL-XL) 25 MG 24 hr tablet Take 12.5 mg by mouth daily.  6  . oxyCODONE-acetaminophen (PERCOCET) 5-325 MG tablet Take 1 tablet by mouth 2 (two) times daily as needed for severe pain. 60 tablet 0  . pregabalin (LYRICA) 50 MG capsule Take 50 mg by mouth 3 (three) times daily.    [name] spironolactone (ALDACTONE) 25 MG tablet Take 25 mg by mouth daily.    [name] tiZANidine (ZANAFLEX) 4 MG tablet Take 4 mg by mouth every 6 (six) hours as needed for muscle spasms.    . traZODone (DESYREL) 50 MG tablet Take 50 mg by mouth at bedtime.    . Vitamin D, Ergocalciferol, (DRISDOL) 1.25 MG (50000 UT) CAPS capsule Take 1 capsule (50,000 Units total) by mouth every 7 (seven) days. 4 capsule 0  . [DISCONTINUED] DULoxetine (CYMBALTA) 30 MG capsule Take 30 mg by mouth  daily.     No current facility-administered medications on file prior to visit.     PAST MEDICAL HISTORY: Past Medical History:  Diagnosis Date  . Anemia   . Cardiomyopathy ([place]) 06/23/2014  . CHF (congestive heart failure) ([place])   . Chronic lower back pain   . Constipation   . DDD (degenerative disc disease)    at L4-5 and L5-S1  . Essential hypertension 06/23/2014  . Family history of adverse reaction to anesthesia    my daughter " came out in a rage."  . Family history of breast cancer   . Family history of colon cancer   . Hypertension   . Joint pain   . Obesity (BMI 30-39.9)   . PONV (postoperative nausea and vomiting)   . Swelling of lower leg   . Vitamin D deficiency     PAST SURGICAL HISTORY: Past Surgical History:  Procedure Laterality Date  . ABDOMINAL HYSTERECTOMY    . ANTERIOR  LUMBAR FUSION  12/19/2010   L4-5 and L5-S1  . BREAST LUMPECTOMY    . CESAREAN SECTION  1990  . COLONOSCOPY    . LAPAROSCOPIC OVARIAN CYSTECTOMY     post cyst rupture  . LEFT AND RIGHT HEART CATHETERIZATION WITH CORONARY ANGIOGRAM N/A 06/27/2014   Procedure: LEFT AND RIGHT HEART CATHETERIZATION WITH CORONARY ANGIOGRAM;  Surgeon: Corky CraftsJayadeep S Varanasi, MD;  [facility];  Service: Cardiovascular;  Laterality: N/A;  . NASAL SINUS SURGERY    . SACROILIAC JOINT FUSION Right 05/17/2015   Procedure: SACROILIAC JOINT FUSION;  Surgeon: Estill BambergMark Dumonski, MD;  [facility];  Service: Orthopedics;  Laterality: Right;  Right sided sacroiliac joint fusion  . WISDOM TOOTH EXTRACTION      SOCIAL HISTORY: Social History   Tobacco Use  . Smoking status: Never Smoker  . Smokeless tobacco: Never Used  Substance Use Topics  . Alcohol use: Yes    Comment: rare  . Drug use: No    FAMILY HISTORY: Family History  Problem Relation Age of Onset  . Hypertension Mother   . Heart attack Mother 3359  . Obesity Mother   . Heart disease Mother   . Heart disease Father   . High blood pressure Father    . Obesity Father   . Colon cancer Brother 5044  . Breast cancer Maternal Aunt        dx under 45  . Breast cancer Paternal Aunt        dx under 50  . Colon cancer Paternal Uncle        dx over 4550  . Colon cancer Paternal Grandmother        dx over 6950  . Breast cancer Maternal Aunt 62  . Colon cancer Paternal Aunt        dx over 3850  . Breast cancer Paternal Aunt        dx over 50    ROS: Review of Systems  Respiratory: Negative for shortness of breath (on exertion).   Cardiovascular: Negative for chest pain.  Gastrointestinal: Negative for nausea and vomiting.  Musculoskeletal:       Negative for muscle weakness    PHYSICAL EXAM: Pt in no acute distress  RECENT LABS AND TESTS: BMET    Component Value Date/Time   NA 138 02/22/2018 1242   K 4.3 02/22/2018 1242   CL 99 02/22/2018 1242   CO2 24 02/22/2018 1242   GLUCOSE 81 02/22/2018 1242   GLUCOSE 83 08/23/2016 1223   BUN 8 02/22/2018 1242   CREATININE 0.62 02/22/2018 1242   CALCIUM 8.8 02/22/2018 1242   GFRNONAA 105 02/22/2018 1242   GFRAA 122 02/22/2018 1242   Lab Results  Component Value Date   HGBA1C 5.3 02/22/2018   Lab Results  Component Value Date   INSULIN 6.5 02/22/2018   CBC    Component Value Date/Time   WBC 5.9 02/22/2018 1242   WBC 10.2 08/23/2016 1223   RBC 4.57 02/22/2018 1242   RBC 4.73 08/23/2016 1223   HGB 12.8 02/22/2018 1242   HCT 39.5 02/22/2018 1242   PLT 200 08/23/2016 1223   MCV 86 02/22/2018 1242   MCH 28.0 02/22/2018 1242   MCH 27.5 08/23/2016 1223   MCHC 32.4 02/22/2018 1242   MCHC 32.8 08/23/2016 1223   RDW 13.6 02/22/2018 1242   LYMPHSABS 2.5 02/22/2018 1242   MONOABS 0.4 05/10/2015 1439   EOSABS 0.1 02/22/2018 1242   BASOSABS 0.1 02/22/2018 1242  Iron/TIBC/Ferritin/ %Sat No results found for: IRON, TIBC, FERRITIN, IRONPCTSAT Lipid Panel     Component Value Date/Time   CHOL 218 (H) 02/22/2018 1242   TRIG 47 02/22/2018 1242   HDL 70 02/22/2018 1242   LDLCALC  139 (H) 02/22/2018 1242   Hepatic Function Panel     Component Value Date/Time   PROT 6.9 02/22/2018 1242   ALBUMIN 4.7 02/22/2018 1242   AST 19 02/22/2018 1242   ALT 13 02/22/2018 1242   ALKPHOS 57 02/22/2018 1242   BILITOT 0.2 02/22/2018 1242      Component Value Date/Time   TSH 0.869 02/22/2018 1242     Ref. Range 02/22/2018 12:42  Vitamin D, 25-Hydroxy Latest Ref Range: 30.0 - 100.0 ng/mL 22.5 (L)    I, [place] CraneJoanne Murray, am acting as Energy managertranscriptionist for [employer] A. Manson PasseyBrown, DO  I have reviewed the above documentation for accuracy and completeness, and I agree with the above. -Corinna CapraAngel Egon Dittus, DO

## 2018-11-22 ENCOUNTER — Encounter (INDEPENDENT_AMBULATORY_CARE_PROVIDER_SITE_OTHER): Payer: Self-pay | Admitting: Bariatrics

## 2018-11-27 ENCOUNTER — Other Ambulatory Visit (INDEPENDENT_AMBULATORY_CARE_PROVIDER_SITE_OTHER): Payer: Self-pay | Admitting: Family Medicine

## 2018-11-27 DIAGNOSIS — E8881 Metabolic syndrome: Secondary | ICD-10-CM

## 2018-12-01 ENCOUNTER — Other Ambulatory Visit (INDEPENDENT_AMBULATORY_CARE_PROVIDER_SITE_OTHER): Payer: Self-pay

## 2018-12-01 DIAGNOSIS — E8881 Metabolic syndrome: Secondary | ICD-10-CM

## 2018-12-01 MED ORDER — METFORMIN HCL 500 MG PO TABS: ORAL_TABLET | ORAL | 0 refills | Status: DC

## 2018-12-06 ENCOUNTER — Other Ambulatory Visit (INDEPENDENT_AMBULATORY_CARE_PROVIDER_SITE_OTHER): Payer: Self-pay | Admitting: Family Medicine

## 2018-12-06 ENCOUNTER — Other Ambulatory Visit: Payer: Self-pay | Admitting: Cardiology

## 2018-12-06 ENCOUNTER — Other Ambulatory Visit: Payer: Self-pay | Admitting: *Deleted

## 2018-12-06 DIAGNOSIS — Z1231 Encounter for screening mammogram for malignant neoplasm of breast: Secondary | ICD-10-CM | POA: Diagnosis not present

## 2018-12-06 DIAGNOSIS — Z13220 Encounter for screening for lipoid disorders: Secondary | ICD-10-CM | POA: Diagnosis not present

## 2018-12-06 DIAGNOSIS — Z0000 Encounter for general adult medical examination without abnormal findings: Secondary | ICD-10-CM | POA: Diagnosis not present

## 2018-12-06 DIAGNOSIS — Z90710 Acquired absence of both cervix and uterus: Secondary | ICD-10-CM | POA: Diagnosis not present

## 2018-12-06 DIAGNOSIS — J3089 Other allergic rhinitis: Secondary | ICD-10-CM

## 2018-12-06 DIAGNOSIS — Z981 Arthrodesis status: Secondary | ICD-10-CM | POA: Diagnosis not present

## 2018-12-06 DIAGNOSIS — Z780 Asymptomatic menopausal state: Secondary | ICD-10-CM | POA: Diagnosis not present

## 2018-12-06 DIAGNOSIS — M8589 Other specified disorders of bone density and structure, multiple sites: Secondary | ICD-10-CM | POA: Diagnosis not present

## 2018-12-06 DIAGNOSIS — Z131 Encounter for screening for diabetes mellitus: Secondary | ICD-10-CM | POA: Diagnosis not present

## 2018-12-06 DIAGNOSIS — Z01419 Encounter for gynecological examination (general) (routine) without abnormal findings: Secondary | ICD-10-CM | POA: Diagnosis not present

## 2018-12-06 DIAGNOSIS — Z1329 Encounter for screening for other suspected endocrine disorder: Secondary | ICD-10-CM | POA: Diagnosis not present

## 2018-12-06 DIAGNOSIS — Z130 Encounter for screening for diseases of the blood and blood-forming organs and certain disorders involving the immune mechanism: Secondary | ICD-10-CM | POA: Diagnosis not present

## 2018-12-06 DIAGNOSIS — Z803 Family history of malignant neoplasm of breast: Secondary | ICD-10-CM | POA: Diagnosis not present

## 2018-12-06 NOTE — Telephone Encounter (Signed)
°*  STAT* If patient is at the pharmacy, call can be transferred to refill team.   1. Which medications need to be refilled? (please list name of each medication and dose if known) Furosemide 40mg  tablet 1/2 tablet every day  2. Which pharmacy/location (including street and city if local pharmacy) is medication to be sent to?[facility] [address] [place]  3. Do they need a 30 day or 90 day supply? [place]

## 2018-12-07 MED ORDER — FUROSEMIDE 40 MG PO TABS: 20.0000 mg | ORAL_TABLET | Freq: Every day | ORAL | 0 refills | Status: DC

## 2018-12-07 NOTE — Telephone Encounter (Signed)
Refill for furosemide sent to get patient to overdue follow up appointment scheduled with Dr. Agustin Cree on 01/12/2019. Patient will receive further refills during visit.

## 2018-12-08 ENCOUNTER — Encounter (INDEPENDENT_AMBULATORY_CARE_PROVIDER_SITE_OTHER): Payer: Self-pay | Admitting: Family Medicine

## 2018-12-08 ENCOUNTER — Telehealth (INDEPENDENT_AMBULATORY_CARE_PROVIDER_SITE_OTHER): Payer: Federal, State, Local not specified - PPO | Admitting: Family Medicine

## 2018-12-08 ENCOUNTER — Other Ambulatory Visit: Payer: Self-pay

## 2018-12-08 DIAGNOSIS — E669 Obesity, unspecified: Secondary | ICD-10-CM

## 2018-12-08 DIAGNOSIS — Z6830 Body mass index (BMI) 30.0-30.9, adult: Secondary | ICD-10-CM

## 2018-12-08 DIAGNOSIS — E559 Vitamin D deficiency, unspecified: Secondary | ICD-10-CM | POA: Diagnosis not present

## 2018-12-08 MED ORDER — VITAMIN D (ERGOCALCIFEROL) 1.25 MG (50000 UNIT) PO CAPS: 50000.0000 [IU] | ORAL_CAPSULE | ORAL | 0 refills | Status: DC

## 2018-12-09 NOTE — Progress Notes (Signed)
Office: [phone]  /  Fax: [phone] TeleHealth Visit:  Molly Norton has verbally consented to this TeleHealth visit today. The patient is located at home, the provider is located at the [facility] office. The participants in this visit include the listed provider and patient. Molly Norton was unable to use doxy.me today and the telehealth visit was conducted via telephone.  HPI:   Chief Complaint: OBESITY Molly Norton is here to discuss her progress with her obesity treatment plan. She is on the Category 3 plan and is following her eating plan approximately 85 % of the time. She states she is exercising 0 minutes 0 times per week. Molly Norton has done well with weight loss and can tell that she is smaller, especially in her thighs. She is not weighing at home, but is doing well following her Category 3 plan. Her hunger is controlled and she sometimes struggles with eating all of her food.  We were unable to weigh the patient today for this TeleHealth visit. She feels as if she has lost weight since her last visit. She has lost 13 lbs since starting treatment with us.  Vitamin D Deficiency Molly Norton has a diagnosis of vitamin D deficiency. She is currently stable on vit D. Molly Norton denies nausea, vomiting, or muscle weakness.  ASSESSMENT AND PLAN:  Vitamin D deficiency - Plan: Vitamin D, Ergocalciferol, (DRISDOL) 1.25 MG (50000 UT) CAPS capsule  Class 1 obesity with serious comorbidity and body mass index (BMI) of 30.0 to 30.9 in adult, unspecified obesity type  PLAN:  Vitamin D Deficiency Molly Norton was informed that low vitamin D levels contribute to fatigue and are associated with obesity, breast, and colon cancer. Molly Norton agrees to continue to take prescription Vit D @50 ,000 IU every week #4 with no refills and will follow up for routine testing of vitamin D, at least 2-3 times per year. She was informed of the risk of over-replacement of vitamin D and agrees to not  increase her dose unless she discusses this with us first. Molly Norton agrees to follow up in 3 weeks as directed.  I spent > than 50% of the 15 minute visit on counseling as documented in the note.  Obesity Molly Norton is currently in the action stage of change. As such, her goal is to continue with weight loss efforts. She has agreed to follow the Category 3 plan. We discussed "lean meat" equivalents and lower sugar fruit options. Molly Norton has been instructed to work up to a goal of 150 minutes of combined cardio and strengthening exercise per week for weight loss and overall health benefits. We discussed the following Behavioral Modification Strategies today: increasing lean protein intake, no skipping meals, and decreasing simple carbohydrates.   Molly Norton has agreed to follow up with our clinic in 3 weeks. She was informed of the importance of frequent follow up visits to maximize her success with intensive lifestyle modifications for her multiple health conditions.  ALLERGIES: Allergies  Allergen Reactions   Adhesive [Tape]     Steri-strips caused blisters   Latex Itching and Rash   Lisinopril     Difficult swallowing   Relafen [Nabumetone] Swelling   Valium [Diazepam] Other (See Comments)    Causes hyper activity Has opposite effect ,becomes very agitated   Codeine Itching   Other     NO BLOOD OR BLOOD PRODUCTS   Robaxin [Methocarbamol] Other (See Comments)    Hospital gave it to her she is unsure of reaction    MEDICATIONS: Current Outpatient  Medications on File Prior to Visit  Medication Sig Dispense Refill   baclofen (LIORESAL) 20 MG tablet Take 1 tablet (20 mg total) by mouth 4 (four) times daily. 120 each 5   cyclobenzaprine (FLEXERIL) 10 MG tablet Take 1 tablet (10 mg total) by mouth at bedtime. 30 tablet 2   docusate sodium (COLACE) 100 MG capsule Take 1 capsule (100 mg total) by mouth daily. 30 capsule 0   fluticasone (FLONASE) 50 MCG/ACT nasal spray Place 2  sprays into both nostrils daily. 48 g 0   furosemide (LASIX) 40 MG tablet Take 0.5 tablets (20 mg total) by mouth daily. 37 tablet 0   gabapentin (NEURONTIN) 100 MG capsule Take two capsules four times a day 240 capsule 3   hydrALAZINE (APRESOLINE) 50 MG tablet Take 50 mg by mouth 2 (two) times daily.      metFORMIN (GLUCOPHAGE) 500 MG tablet TAKE 1 TABLET BY MOUTH EVERY DAY WITH BREAKFAST 30 tablet 0   metoprolol succinate (TOPROL-XL) 25 MG 24 hr tablet Take 12.5 mg by mouth daily.  6   oxyCODONE-acetaminophen (PERCOCET) 5-325 MG tablet Take 1 tablet by mouth 2 (two) times daily as needed for severe pain. 60 tablet 0   pregabalin (LYRICA) 50 MG capsule Take 50 mg by mouth 3 (three) times daily.     spironolactone (ALDACTONE) 25 MG tablet Take 25 mg by mouth daily.     tiZANidine (ZANAFLEX) 4 MG tablet Take 4 mg by mouth every 6 (six) hours as needed for muscle spasms.     traZODone (DESYREL) 50 MG tablet Take 50 mg by mouth at bedtime.     [DISCONTINUED] DULoxetine (CYMBALTA) 30 MG capsule Take 30 mg by mouth daily.     No current facility-administered medications on file prior to visit.     PAST MEDICAL HISTORY: Past Medical History:  Diagnosis Date   Anemia    Cardiomyopathy ([employer]) 06/23/2014   CHF (congestive heart failure) ([facility])    Chronic lower back pain    Constipation    DDD (degenerative disc disease)    at L4-5 and L5-S1   Essential hypertension 06/23/2014   Family history of adverse reaction to anesthesia    my daughter " came out in a rage."   Family history of breast cancer    Family history of colon cancer    Hypertension    Joint pain    Obesity (BMI 30-39.9)    PONV (postoperative nausea and vomiting)    Swelling of lower leg    Vitamin D deficiency     PAST SURGICAL HISTORY: Past Surgical History:  Procedure Laterality Date   ABDOMINAL HYSTERECTOMY     ANTERIOR LUMBAR FUSION  12/19/2010   L4-5 and L5-S1   BREAST LUMPECTOMY      CESAREAN SECTION  1990   COLONOSCOPY     LAPAROSCOPIC OVARIAN CYSTECTOMY     post cyst rupture   LEFT AND RIGHT HEART CATHETERIZATION WITH CORONARY ANGIOGRAM N/A 06/27/2014   Procedure: LEFT AND RIGHT HEART CATHETERIZATION WITH CORONARY ANGIOGRAM;  Surgeon: Jettie Booze, MD;  Location[facility] C[facility] LAB;  Service: Cardiovascular;  Laterality: N/A;   NASAL SINUS SURGERY     SACROILIAC JOINT FUSION Right 05/17/2015   Procedure: SACROILIAC JOINT FUSION;  Surgeon: Phylliss Bob, MD;  Location: [facility];  Service: Orthopedics;  Laterality: Right;  Right sided sacroiliac joint fusion   WISDOM TOOTH EXTRACTION      SOCIAL HISTORY: Social History   Tobacco Use  Smoking status: Never Smoker   Smokeless tobacco: Never Used  Substance Use Topics   Alcohol use: Yes    Comment: rare   Drug use: No    FAMILY HISTORY: Family History  Problem Relation Age of Onset   Hypertension Mother    Heart attack Mother 1759   Obesity Mother    Heart disease Mother    Heart disease Father    High blood pressure Father    Obesity Father    Colon cancer Brother 7844   Breast cancer Maternal Aunt        dx under 3845   Breast cancer Paternal Aunt        dx under 2650   Colon cancer Paternal Uncle        dx over 9250   Colon cancer Paternal Grandmother        dx over 1250   Breast cancer Maternal Aunt 62   Colon cancer Paternal Aunt        dx over 6650   Breast cancer Paternal Aunt        dx over 50    ROS: Review of Systems  Gastrointestinal: Negative for nausea and vomiting.  Musculoskeletal:       Negative for muscle weakness.    PHYSICAL EXAM: Pt in no acute distress  RECENT LABS AND TESTS: BMET    Component Value Date/Time   NA 138 02/22/2018 1242   K 4.3 02/22/2018 1242   CL 99 02/22/2018 1242   CO2 24 02/22/2018 1242   GLUCOSE 81 02/22/2018 1242   GLUCOSE 83 08/23/2016 1223   BUN 8 02/22/2018 1242   CREATININE 0.62 02/22/2018 1242   CALCIUM 8.8 02/22/2018 1242     GFRNONAA 105 02/22/2018 1242   GFRAA 122 02/22/2018 1242   Lab Results  Component Value Date   HGBA1C 5.3 02/22/2018   Lab Results  Component Value Date   INSULIN 6.5 02/22/2018   CBC    Component Value Date/Time   WBC 5.9 02/22/2018 1242   WBC 10.2 08/23/2016 1223   RBC 4.57 02/22/2018 1242   RBC 4.73 08/23/2016 1223   HGB 12.8 02/22/2018 1242   HCT 39.5 02/22/2018 1242   PLT 200 08/23/2016 1223   MCV 86 02/22/2018 1242   MCH 28.0 02/22/2018 1242   MCH 27.5 08/23/2016 1223   MCHC 32.4 02/22/2018 1242   MCHC 32.8 08/23/2016 1223   RDW 13.6 02/22/2018 1242   LYMPHSABS 2.5 02/22/2018 1242   MONOABS 0.4 05/10/2015 1439   EOSABS 0.1 02/22/2018 1242   BASOSABS 0.1 02/22/2018 1242   Iron/TIBC/Ferritin/ %Sat No results found for: IRON, TIBC, FERRITIN, IRONPCTSAT Lipid Panel     Component Value Date/Time   CHOL 218 (H) 02/22/2018 1242   TRIG 47 02/22/2018 1242   HDL 70 02/22/2018 1242   LDLCALC 139 (H) 02/22/2018 1242   Hepatic Function Panel     Component Value Date/Time   PROT 6.9 02/22/2018 1242   ALBUMIN 4.7 02/22/2018 1242   AST 19 02/22/2018 1242   ALT 13 02/22/2018 1242   ALKPHOS 57 02/22/2018 1242   BILITOT 0.2 02/22/2018 1242      Component Value Date/Time   TSH 0.869 02/22/2018 1242   Results for Molly RomeCUTHBERT, Tayja K (MRN [id number]) as of 12/09/2018 12:19  Ref. Range 02/22/2018 12:42  Vitamin D, 25-Hydroxy Latest Ref Range: 30.0 - 100.0 ng/mL 22.5 (L)     I, Kirke Corinara Soares, CMA, am acting as Energy managertranscriptionist for [employer] Corporation[employer]aren D. Astella Desir,  MD I have reviewed the above documentation for accuracy and completeness, and I agree with the above. -Quillian Quincearen Reylene Stauder, MD

## 2018-12-29 ENCOUNTER — Encounter (INDEPENDENT_AMBULATORY_CARE_PROVIDER_SITE_OTHER): Payer: Self-pay | Admitting: Family Medicine

## 2018-12-29 ENCOUNTER — Other Ambulatory Visit: Payer: Self-pay

## 2018-12-29 ENCOUNTER — Telehealth (INDEPENDENT_AMBULATORY_CARE_PROVIDER_SITE_OTHER): Payer: Federal, State, Local not specified - PPO | Admitting: Family Medicine

## 2018-12-29 DIAGNOSIS — E669 Obesity, unspecified: Secondary | ICD-10-CM

## 2018-12-29 DIAGNOSIS — G4701 Insomnia due to medical condition: Secondary | ICD-10-CM

## 2018-12-29 DIAGNOSIS — Z6830 Body mass index (BMI) 30.0-30.9, adult: Secondary | ICD-10-CM | POA: Diagnosis not present

## 2018-12-29 MED ORDER — ORLISTAT 120 MG PO CAPS: 120.0000 mg | ORAL_CAPSULE | Freq: Three times a day (TID) | ORAL | 0 refills | Status: DC

## 2018-12-30 NOTE — Progress Notes (Signed)
Office: [phone]  /  Fax: [phone] TeleHealth Visit:  Molly Norton has verbally consented to this TeleHealth visit today. The patient is located at home, the provider is located at the [facility] office. The participants in this visit include the listed provider and patient. The visit was conducted today via FaceTime.  HPI:   Chief Complaint: OBESITY Molly Norton is here to discuss her progress with her obesity treatment plan. She is on the Category 3 plan and is following her eating plan approximately 60% of the time. She states she is exercising 0 minutes 0 times per week. Molly Norton is not following her plan closely; she is not eating everything on her plan and is snacking on higher carb foods. She did her own "research" and asks for a prescription for Xenical. We were unable to weigh the patient today for this TeleHealth visit. She feels as if she has gained weight since her last visit. She has lost 13 lbs since starting treatment with [place].  Insomnia Molly Norton notes worsening insomnia with worsening menopausal symptoms. She has tried multiple medications with no effect. Her menopause symptoms have not improved with estradiol gel but she also has not been taking it regularly.  ASSESSMENT AND PLAN:  Insomnia due to medical condition  Class 1 obesity with serious comorbidity and body mass index (BMI) of 30.0 to 30.9 in adult, unspecified obesity type - Plan: orlistat (XENICAL) 120 MG capsule  PLAN:  Insomnia The problem of recurrent insomnia was discussed. Avoidance of caffeine sources was strongly encouraged and sleep hygiene issues were reviewed. Molly Norton will increase her Trazodone to 50 mg QHS (no refill needed) and will follow-up with [place] as directed to monitor her progress.  I spent > than 50% of the 25 minute visit on counseling as documented in the note.  Obesity Molly Norton is currently in the action stage of change. As such, her goal is to continue with  weight loss efforts. She has agreed to follow the Category 3 plan. Molly Norton has been instructed to work up to a goal of 150 minutes of combined cardio and strengthening exercise per week for weight loss and overall health benefits. We discussed the following Behavioral Modification Strategies today: increasing lean protein intake, no skipping meals, and decrease all fat in diet.  Molly Norton will start Xenical 120 mg TID #90 with 0 refills and agrees to follow-up with our clinic in 2 weeks. She was warned about the side effects of oily anal leakage and she still wanted to proceed.  Molly Norton has agreed to follow-up with our clinic in 2 weeks. She was informed of the importance of frequent follow-up visits to maximize her success with intensive lifestyle modifications for her multiple health conditions.  ALLERGIES: Allergies  Allergen Reactions  . Adhesive [Tape]     Steri-strips caused blisters  . Latex Itching and Rash  . Lisinopril     Difficult swallowing  . Relafen [Nabumetone] Swelling  . Valium [Diazepam] Other (See Comments)    Causes hyper activity Has opposite effect ,becomes very agitated  . Codeine Itching  . Other     NO BLOOD OR BLOOD PRODUCTS  . Robaxin [Methocarbamol] Other (See Comments)    Hospital gave it to her she is unsure of reaction    MEDICATIONS: Current Outpatient Medications on File Prior to Visit  Medication Sig Dispense Refill  . baclofen (LIORESAL) 20 MG tablet Take 1 tablet (20 mg total) by mouth 4 (four) times daily. 120 each 5  .  cyclobenzaprine (FLEXERIL) 10 MG tablet Take 1 tablet (10 mg total) by mouth at bedtime. 30 tablet 2  . docusate sodium (COLACE) 100 MG capsule Take 1 capsule (100 mg total) by mouth daily. 30 capsule 0  . Estradiol (DIVIGEL) 1 MG/GM GEL Place onto the skin.    . fluticasone (FLONASE) 50 MCG/ACT nasal spray Place 2 sprays into both nostrils daily. 48 g 0  . furosemide (LASIX) 40 MG tablet Take 0.5 tablets (20 mg total) by  mouth daily. 37 tablet 0  . gabapentin (NEURONTIN) 100 MG capsule Take two capsules four times a day 240 capsule 3  . hydrALAZINE (APRESOLINE) 50 MG tablet Take 50 mg by mouth 2 (two) times daily.     . metFORMIN (GLUCOPHAGE) 500 MG tablet TAKE 1 TABLET BY MOUTH EVERY DAY WITH BREAKFAST 30 tablet 0  . metoprolol succinate (TOPROL-XL) 25 MG 24 hr tablet Take 12.5 mg by mouth daily.  6  . oxyCODONE-acetaminophen (PERCOCET) 5-325 MG tablet Take 1 tablet by mouth 2 (two) times daily as needed for severe pain. 60 tablet 0  . pregabalin (LYRICA) 50 MG capsule Take 50 mg by mouth 3 (three) times daily.    [name]. spironolactone (ALDACTONE) 25 MG tablet Take 25 mg by mouth daily.    [name]. tiZANidine (ZANAFLEX) 4 MG tablet Take 4 mg by mouth every 6 (six) hours as needed for muscle spasms.    . traZODone (DESYREL) 50 MG tablet Take 50 mg by mouth at bedtime.    . Vitamin D, Ergocalciferol, (DRISDOL) 1.25 MG (50000 UT) CAPS capsule Take 1 capsule (50,000 Units total) by mouth every 7 (seven) days. 4 capsule 0  . [DISCONTINUED] DULoxetine (CYMBALTA) 30 MG capsule Take 30 mg by mouth daily.     No current facility-administered medications on file prior to visit.     PAST MEDICAL HISTORY: Past Medical History:  Diagnosis Date  . Anemia   . Cardiomyopathy (HCC) 06/23/2014  . CHF (congestive heart failure) (HCC)   . Chronic lower back pain   . Constipation   . DDD (degenerative disc disease)    at L4-5 and L5-S1  . Essential hypertension 06/23/2014  . Family history of adverse reaction to anesthesia    my daughter " came out in a rage."  . Family history of breast cancer   . Family history of colon cancer   . Hypertension   . Joint pain   . Obesity (BMI 30-39.9)   . PONV (postoperative nausea and vomiting)   . Swelling of lower leg   . Vitamin D deficiency     PAST SURGICAL HISTORY: Past Surgical History:  Procedure Laterality Date  . ABDOMINAL HYSTERECTOMY    . ANTERIOR LUMBAR FUSION  12/19/2010    L4-5 and L5-S1  . BREAST LUMPECTOMY    . CESAREAN SECTION  1990  . COLONOSCOPY    . LAPAROSCOPIC OVARIAN CYSTECTOMY     post cyst rupture  . LEFT AND RIGHT HEART CATHETERIZATION WITH CORONARY ANGIOGRAM N/A 06/27/2014   Procedure: LEFT AND RIGHT HEART CATHETERIZATION WITH CORONARY ANGIOGRAM;  Surgeon: Corky CraftsJayadeep S Varanasi, MD;  [facility];  Service: Cardiovascular;  Laterality: N/A;  . NASAL SINUS SURGERY    . SACROILIAC JOINT FUSION Right 05/17/2015   Procedure: SACROILIAC JOINT FUSION;  Surgeon: Estill BambergMark Dumonski, MD;  [facility];  Service: Orthopedics;  Laterality: Right;  Right sided sacroiliac joint fusion  . WISDOM TOOTH EXTRACTION      SOCIAL HISTORY: Social History  Tobacco Use  . Smoking status: Never Smoker  . Smokeless tobacco: Never Used  Substance Use Topics  . Alcohol use: Yes    Comment: rare  . Drug use: No    FAMILY HISTORY: Family History  Problem Relation Age of Onset  . Hypertension Mother   . Heart attack Mother [id number]  . Obesity Mother   . Heart disease Mother   . Heart disease Father   . High blood pressure Father   . Obesity Father   . Colon cancer Brother 344  . Breast cancer Maternal Aunt        dx under 45  . Breast cancer Paternal Aunt        dx under 50  . Colon cancer Paternal Uncle        dx over 8850  . Colon cancer Paternal Grandmother        dx over 650  . Breast cancer Maternal Aunt 62  . Colon cancer Paternal Aunt        dx over 3250  . Breast cancer Paternal Aunt        dx over 50   ROS: Review of Systems  Psychiatric/Behavioral: The patient has insomnia.    PHYSICAL EXAM: Pt in no acute distress  RECENT LABS AND TESTS: BMET    Component Value Date/Time   NA 138 02/22/2018 1242   K 4.3 02/22/2018 1242   CL 99 02/22/2018 1242   CO2 24 02/22/2018 1242   GLUCOSE 81 02/22/2018 1242   GLUCOSE 83 08/23/2016 1223   BUN 8 02/22/2018 1242   CREATININE 0.62 02/22/2018 1242   CALCIUM 8.8 02/22/2018 1242   GFRNONAA 105  02/22/2018 1242   GFRAA 122 02/22/2018 1242   Lab Results  Component Value Date   HGBA1C 5.3 02/22/2018   Lab Results  Component Value Date   INSULIN 6.5 02/22/2018   CBC    Component Value Date/Time   WBC 5.9 02/22/2018 1242   WBC 10.2 08/23/2016 1223   RBC 4.57 02/22/2018 1242   RBC 4.73 08/23/2016 1223   HGB 12.8 02/22/2018 1242   HCT 39.5 02/22/2018 1242   PLT 200 08/23/2016 1223   MCV 86 02/22/2018 1242   MCH 28.0 02/22/2018 1242   MCH 27.5 08/23/2016 1223   MCHC 32.4 02/22/2018 1242   MCHC 32.8 08/23/2016 1223   RDW 13.6 02/22/2018 1242   LYMPHSABS 2.5 02/22/2018 1242   MONOABS 0.4 05/10/2015 1439   EOSABS 0.1 02/22/2018 1242   BASOSABS 0.1 02/22/2018 1242   Iron/TIBC/Ferritin/ %Sat No results found for: IRON, TIBC, FERRITIN, IRONPCTSAT Lipid Panel     Component Value Date/Time   CHOL 218 (H) 02/22/2018 1242   TRIG 47 02/22/2018 1242   HDL 70 02/22/2018 1242   LDLCALC 139 (H) 02/22/2018 1242   Hepatic Function Panel     Component Value Date/Time   PROT 6.9 02/22/2018 1242   ALBUMIN 4.7 02/22/2018 1242   AST 19 02/22/2018 1242   ALT 13 02/22/2018 1242   ALKPHOS 57 02/22/2018 1242   BILITOT 0.2 02/22/2018 1242      Component Value Date/Time   TSH 0.869 02/22/2018 1242   Results for Prince RomeCUTHBERT, Annita K (MRN [id number]) as of 12/30/2018 10:02  Ref. Range 02/22/2018 12:42  Vitamin D, 25-Hydroxy Latest Ref Range: 30.0 - 100.0 ng/mL 22.5 (L)    I, Marianna Paymentenise Haag, am acting as Energy managertranscriptionist for Quillian Quincearen Cindi Ghazarian, MD I have reviewed the above documentation for accuracy and completeness, and I agree  with the above. -Dennard Nip, MD

## 2019-01-02 ENCOUNTER — Other Ambulatory Visit (INDEPENDENT_AMBULATORY_CARE_PROVIDER_SITE_OTHER): Payer: Self-pay | Admitting: Bariatrics

## 2019-01-02 DIAGNOSIS — E8881 Metabolic syndrome: Secondary | ICD-10-CM

## 2019-01-07 ENCOUNTER — Telehealth (INDEPENDENT_AMBULATORY_CARE_PROVIDER_SITE_OTHER): Payer: Federal, State, Local not specified - PPO | Admitting: Cardiology

## 2019-01-07 ENCOUNTER — Encounter: Payer: Self-pay | Admitting: Cardiology

## 2019-01-07 ENCOUNTER — Other Ambulatory Visit: Payer: Self-pay

## 2019-01-07 VITALS — BP 158/100

## 2019-01-07 DIAGNOSIS — R0602 Shortness of breath: Secondary | ICD-10-CM

## 2019-01-07 DIAGNOSIS — I420 Dilated cardiomyopathy: Secondary | ICD-10-CM

## 2019-01-07 DIAGNOSIS — I10 Essential (primary) hypertension: Secondary | ICD-10-CM

## 2019-01-07 DIAGNOSIS — R002 Palpitations: Secondary | ICD-10-CM | POA: Insufficient documentation

## 2019-01-07 NOTE — Patient Instructions (Signed)
Medication Instructions:  Your physician recommends that you continue on your current medications as directed. Please refer to the Current Medication list given to you today.  If you need a refill on your cardiac medications before your next appointment, please call your pharmacy.   Lab work: None.  If you have labs (blood work) drawn today and your tests are completely normal, you will receive your results only by: [name] MyChart Message (if you have MyChart) OR . A paper copy in the mail If you have any lab test that is abnormal or we need to change your treatment, we will call you to review the results.  Testing/Procedures: Your physician has requested that you have an echocardiogram. Echocardiography is a painless test that uses sound waves to create images of your heart. It provides your doctor with information about the size and shape of your heart and how well your heart's chambers and valves are working. This procedure takes approximately one hour. There are no restrictions for this procedure.  Your physician has recommended that you wear a holter monitor. Holter monitors are medical devices that record the heart's electrical activity. Doctors most often use these monitors to diagnose arrhythmias. Arrhythmias are problems with the speed or rhythm of the heartbeat. The monitor is a small, portable device. You can wear one while you do your normal daily activities. This is usually used to diagnose what is causing palpitations/syncope (passing out). Wear for 7 days.     Follow-Up: At [facility], you and your health needs are our priority.  As part of our continuing mission to provide you with exceptional heart care, we have created designated Provider Care Teams.  These Care Teams include your primary Cardiologist (physician) and Advanced Practice Providers (APPs -  Physician Assistants and Nurse Practitioners) who all work together to provide you with the care you need, when you need it.  You will need a follow up appointment in 1 weeks.  Please call our office 2 months in advance to schedule this appointment.  You may see No primary care provider on file. or another member of our [employer] Provider Team in [place]: Shirlee More, MD . Jyl Heinz, MD  Any Other Special Instructions Will Be Listed Below (If Applicable).   Echocardiogram An echocardiogram is a procedure that uses painless sound waves (ultrasound) to produce an image of the heart. Images from an echocardiogram can provide important information about:  Signs of coronary artery disease (CAD).  Aneurysm detection. An aneurysm is a weak or damaged part of an artery wall that bulges out from the normal force of blood pumping through the body.  Heart size and shape. Changes in the size or shape of the heart can be associated with certain conditions, including heart failure, aneurysm, and CAD.  Heart muscle function.  Heart valve function.  Signs of a past heart attack.  Fluid buildup around the heart.  Thickening of the heart muscle.  A tumor or infectious growth around the heart valves. Tell a health care provider about:  Any allergies you have.  All medicines you are taking, including vitamins, herbs, eye drops, creams, and over-the-counter medicines.  Any blood disorders you have.  Any surgeries you have had.  Any medical conditions you have.  Whether you are pregnant or may be pregnant. What are the risks? Generally, this is a safe procedure. However, problems may occur, including:  Allergic reaction to dye (contrast) that may be used during the procedure. What happens before the procedure?  No specific preparation is needed. You may eat and drink normally. What happens during the procedure?   An IV tube may be inserted into one of your veins.  You may receive contrast through this tube. A contrast is an injection that improves the quality of the pictures from your heart.  A  gel will be applied to your chest.  A wand-like tool (transducer) will be moved over your chest. The gel will help to transmit the sound waves from the transducer.  The sound waves will harmlessly bounce off of your heart to allow the heart images to be captured in real-time motion. The images will be recorded on a computer. The procedure may vary among health care providers and hospitals. What happens after the procedure?  You may return to your normal, everyday life, including diet, activities, and medicines, unless your health care provider tells you not to do that. Summary  An echocardiogram is a procedure that uses painless sound waves (ultrasound) to produce an image of the heart.  Images from an echocardiogram can provide important information about the size and shape of your heart, heart muscle function, heart valve function, and fluid buildup around your heart.  You do not need to do anything to prepare before this procedure. You may eat and drink normally.  After the echocardiogram is completed, you may return to your normal, everyday life, unless your health care provider tells you not to do that. This information is not intended to replace advice given to you by your health care provider. Make sure you discuss any questions you have with your health care provider. Document Released: 05/23/2000 Document Revised: 09/16/2018 Document Reviewed: 06/28/2016 Elsevier Patient Education  2020 [employer].

## 2019-01-07 NOTE — Progress Notes (Signed)
Virtual Visit via Telephone Note   This visit type was conducted due to national recommendations for restrictions regarding the COVID-19 Pandemic (e.g. social distancing) in an effort to limit this patient's exposure and mitigate transmission in our community.  Due to her co-morbid illnesses, this patient is at least at moderate risk for complications without adequate follow up.  This format is felt to be most appropriate for this patient at this time.  The patient did not have access to video technology/had technical difficulties with video requiring transitioning to audio format only (telephone).  All issues noted in this document were discussed and addressed.  No physical exam could be performed with this format.  Please refer to the patient's chart for her  consent to telehealth for [facility].  Evaluation Performed:  Follow-up visit  This visit type was conducted due to national recommendations for restrictions regarding the COVID-19 Pandemic (e.g. social distancing).  This format is felt to be most appropriate for this patient at this time.  All issues noted in this document were discussed and addressed.  No physical exam was performed (except for noted visual exam findings with Video Visits).  Please refer to the patient's chart (MyChart message for video visits and phone note for telephone visits) for the patient's consent to telehealth [facility].  Date:  01/07/2019  ID: Molly Norton, DOB Nov 30, 1965, MRN [id number]   [place] [id number] [place] [id number]   Provider [facility]   [facility] [place] [facility]  PCP:  Maxie Betterousins, Sheronette, MD  Cardiologist:  Gypsy Balsamobert Jalessa Peyser, MD     Chief Complaint: I have palpitations and shortness of breath  History of Present Illness:    Molly Norton is a 53 y.o. female  who presents via audio/video conferencing for a telehealth visit today.  She is a patient of Dr. Donnie Ahoilley her past medical history  significant for cardiomyopathy with ejection fraction 35%, however last echocardiogram from 2018 showed normalization of left ventricle ejection fraction.  She had a history of cardiac catheterization which showed no significant disease.  She also got history of hyperlipidemia hypertension.  She requested to have a virtual visit today because of symptoms that she experienced.  She started having some palpitations that she will feel her heart skipping flip-flopping and running away usually abrupt onset and gradual offset very rarely last more than a minute.  She feels a little bit dizzy when it happened.  She never passed out because of this.  Additional new complaint that she has the shortness of breath she said any effort will give her shortness of breath she also describes situation that she have difficulty laying flat at night because of 2 problems back pain as well as shortness of breath.  She does have some chronic back problem with some back fusion which prevent her from exercising on the regular basis however shortness of breath is been going on for about a month is very disturbing.  There is no swelling of lower extremities.  She denies having any chest pain tightness squeezing pressure burning chest.   The patient does not have symptoms concerning for COVID-19 infection (fever, chills, cough, or new SHORTNESS OF BREATH).    Prior CV studies:   The following studies were reviewed today:       Past Medical History:  Diagnosis Date   Anemia    Cardiomyopathy (HCC) 06/23/2014   CHF (congestive heart failure) (HCC)    Chronic lower back pain  Constipation    DDD (degenerative disc disease)    at L4-5 and L5-S1   Essential hypertension 06/23/2014   Family history of adverse reaction to anesthesia    my daughter " came out in a rage."   Family history of breast cancer    Family history of colon cancer    Hypertension    Joint pain    Obesity (BMI 30-39.9)    PONV  (postoperative nausea and vomiting)    Swelling of lower leg    Vitamin D deficiency     Past Surgical History:  Procedure Laterality Date   ABDOMINAL HYSTERECTOMY     ANTERIOR LUMBAR FUSION  12/19/2010   L4-5 and L5-S1   BREAST LUMPECTOMY     CESAREAN SECTION  1990   COLONOSCOPY     LAPAROSCOPIC OVARIAN CYSTECTOMY     post cyst rupture   LEFT AND RIGHT HEART CATHETERIZATION WITH CORONARY ANGIOGRAM N/A 06/27/2014   Procedure: LEFT AND RIGHT HEART CATHETERIZATION WITH CORONARY ANGIOGRAM;  Surgeon: Jettie Booze, MD;  Location: [facility] CATH LAB;  Service: Cardiovascular;  Laterality: N/A;   NASAL SINUS SURGERY     SACROILIAC JOINT FUSION Right 05/17/2015   Procedure: SACROILIAC JOINT FUSION;  Surgeon: Phylliss Bob, MD;  Location: [place];  Service: Orthopedics;  Laterality: Right;  Right sided sacroiliac joint fusion   WISDOM TOOTH EXTRACTION       Current Meds  Medication Sig   baclofen (LIORESAL) 20 MG tablet Take 1 tablet (20 mg total) by mouth 4 (four) times daily.   calcium citrate-vitamin D (CITRACAL+D) 315-200 MG-UNIT tablet Take 2 tablets by mouth daily.   cyclobenzaprine (FLEXERIL) 10 MG tablet Take 1 tablet (10 mg total) by mouth at bedtime.   docusate sodium (COLACE) 100 MG capsule Take 1 capsule (100 mg total) by mouth daily.   Estradiol (DIVIGEL) 1 MG/GM GEL Place onto the skin.   fluticasone (FLONASE) 50 MCG/ACT nasal spray Place 2 sprays into both nostrils daily.   furosemide (LASIX) 40 MG tablet Take 0.5 tablets (20 mg total) by mouth daily.   gabapentin (NEURONTIN) 100 MG capsule Take two capsules four times a day (Patient taking differently: Take 100 mg by mouth as needed. Take two capsules four times a day)   hydrALAZINE (APRESOLINE) 25 MG tablet Take 25 mg by mouth 2 (two) times daily.    metFORMIN (GLUCOPHAGE) 500 MG tablet TAKE 1 TABLET BY MOUTH EVERY DAY WITH BREAKFAST   metoprolol succinate (TOPROL-XL) 25 MG 24 hr tablet Take 12.5 mg by  mouth daily.   spironolactone (ALDACTONE) 25 MG tablet Take 25 mg by mouth daily.   tiZANidine (ZANAFLEX) 4 MG tablet Take 4 mg by mouth every 6 (six) hours as needed for muscle spasms.   traZODone (DESYREL) 50 MG tablet Take 25 mg by mouth at bedtime.    Vitamin D, Ergocalciferol, (DRISDOL) 1.25 MG (50000 UT) CAPS capsule Take 1 capsule (50,000 Units total) by mouth every 7 (seven) days.      Family History: The patient's family history includes Breast cancer in her maternal aunt, paternal aunt, and paternal aunt; Breast cancer (age of onset: 68) in her maternal aunt; Colon cancer in her paternal aunt, paternal grandmother, and paternal uncle; Colon cancer (age of onset: 29) in her brother; Heart attack (age of onset: 8) in her mother; Heart disease in her father and mother; High blood pressure in her father; Hypertension in her mother; Obesity in her father and mother.   ROS:  Please see the history of present illness.     All other systems reviewed and are negative.   Labs/Other Tests and Data Reviewed:     Recent Labs: 02/22/2018: ALT 13; BUN 8; Creatinine, Ser 0.62; Hemoglobin 12.8; Potassium 4.3; Sodium 138; TSH 0.869  Recent Lipid Panel    Component Value Date/Time   CHOL 218 (H) 02/22/2018 1242   TRIG 47 02/22/2018 1242   HDL 70 02/22/2018 1242   LDLCALC 139 (H) 02/22/2018 1242      Exam:    Vital Signs:  BP (!) 158/100     Wt Readings from Last 3 Encounters:  07/22/18 194 lb (88 kg)  06/23/18 196 lb (88.9 kg)  04/29/18 201 lb (91.2 kg)     Well nourished, well developed in no acute distress. We talking over the phone she is unable to establish video link.  No symptoms at the time of my interview.   Diagnosis for this visit:   1. Dilated cardiomyopathy (HCC)   2. Essential hypertension   3. Shortness of breath   4. Palpitations      ASSESSMENT & PLAN:    1.  History of dilated cardiomyopathy.  I will schedule her to have an echocardiogram done.   Also will do proBNP as well as Chem-7.  I will not alter any of her medication right now until I have better clarification of the situation will make this very problematic and difficult is the fact that [place] in [place].  [place] actually moved back to [place] and she lives with her but she still insists on as taking care of her.  So, she is planning to come to us to have an echocardiogram done which will make arrangements for. 2.  Essential hypertension lately her blood pressure seems to be elevated.  I asked her to keep a log of it and let me know when I see her. 3.  Shortness of breath echocardiogram will be done 4.  Palpitations.  I will make arrangements for 0 patch for her.  We also talk about potentially purchasing apple watch or cardia  COVID-19 Education: The signs and symptoms of COVID-19 were discussed with the patient and how to seek care for testing (follow up with PCP or arrange E-visit).  The importance of social distancing was discussed today.  Patient Risk:   After full review of this patients clinical status, I feel that they are at least moderate risk at this time.  Time:   Today, I have spent 25 minutes with the patient with telehealth technology discussing pt health issues.  I spent 5 minutes reviewing her chart before the visit.  Visit was finished at 9:42 AM.    Medication Adjustments/Labs and Tests Ordered: Current medicines are reviewed at length with the patient today.  Concerns regarding medicines are outlined above.  No orders of the defined types were placed in this encounter.  Medication changes: No orders of the defined types were placed in this encounter.    Disposition: Follow-up in 1 month  Signed, Georgeanna Leaobert J. Wania Longstreth, MD, [facility] 01/07/2019 9:40 AM    [facility]

## 2019-01-07 NOTE — Addendum Note (Signed)
Addended by: Linna Hoff R on: 01/07/2019 11:02 AM   Modules accepted: Orders

## 2019-01-11 ENCOUNTER — Other Ambulatory Visit: Payer: Self-pay

## 2019-01-11 ENCOUNTER — Ambulatory Visit (HOSPITAL_BASED_OUTPATIENT_CLINIC_OR_DEPARTMENT_OTHER)
Admission: RE | Admit: 2019-01-11 | Discharge: 2019-01-11 | Disposition: A | Discharge status: Home / Self Care | Payer: Federal, State, Local not specified - PPO | Source: Ambulatory Visit | Attending: Cardiology | Admitting: Cardiology

## 2019-01-11 DIAGNOSIS — R002 Palpitations: Secondary | ICD-10-CM | POA: Diagnosis not present

## 2019-01-11 DIAGNOSIS — I420 Dilated cardiomyopathy: Secondary | ICD-10-CM | POA: Diagnosis not present

## 2019-01-11 NOTE — Progress Notes (Signed)
  Echocardiogram 2D Echocardiogram has been performed.  Cardell Peach 01/11/2019, 3:42 PM

## 2019-01-12 ENCOUNTER — Encounter (INDEPENDENT_AMBULATORY_CARE_PROVIDER_SITE_OTHER): Payer: Self-pay | Admitting: Family Medicine

## 2019-01-12 ENCOUNTER — Ambulatory Visit: Payer: Federal, State, Local not specified - PPO | Admitting: Cardiology

## 2019-01-12 ENCOUNTER — Telehealth (INDEPENDENT_AMBULATORY_CARE_PROVIDER_SITE_OTHER): Payer: Federal, State, Local not specified - PPO | Admitting: Family Medicine

## 2019-01-12 DIAGNOSIS — E559 Vitamin D deficiency, unspecified: Secondary | ICD-10-CM | POA: Diagnosis not present

## 2019-01-12 DIAGNOSIS — E8881 Metabolic syndrome: Secondary | ICD-10-CM | POA: Diagnosis not present

## 2019-01-12 DIAGNOSIS — E669 Obesity, unspecified: Secondary | ICD-10-CM | POA: Diagnosis not present

## 2019-01-12 DIAGNOSIS — Z6830 Body mass index (BMI) 30.0-30.9, adult: Secondary | ICD-10-CM

## 2019-01-12 MED ORDER — METFORMIN HCL 500 MG PO TABS: ORAL_TABLET | ORAL | 0 refills | Status: AC

## 2019-01-12 MED ORDER — VITAMIN D (ERGOCALCIFEROL) 1.25 MG (50000 UNIT) PO CAPS: 50000.0000 [IU] | ORAL_CAPSULE | ORAL | 0 refills | Status: AC

## 2019-01-12 MED ORDER — BACLOFEN 20 MG PO TABS: 20.0000 mg | ORAL_TABLET | Freq: Four times a day (QID) | ORAL | 5 refills | Status: AC

## 2019-01-13 ENCOUNTER — Other Ambulatory Visit: Payer: Self-pay

## 2019-01-13 ENCOUNTER — Encounter: Payer: Self-pay | Admitting: Cardiology

## 2019-01-13 ENCOUNTER — Ambulatory Visit (INDEPENDENT_AMBULATORY_CARE_PROVIDER_SITE_OTHER): Payer: Federal, State, Local not specified - PPO | Admitting: Cardiology

## 2019-01-13 VITALS — BP 140/80 | HR 52 | Ht 67.5 in | Wt 203.0 lb

## 2019-01-13 DIAGNOSIS — R002 Palpitations: Secondary | ICD-10-CM | POA: Diagnosis not present

## 2019-01-13 DIAGNOSIS — I10 Essential (primary) hypertension: Secondary | ICD-10-CM | POA: Diagnosis not present

## 2019-01-13 DIAGNOSIS — I420 Dilated cardiomyopathy: Secondary | ICD-10-CM | POA: Diagnosis not present

## 2019-01-13 MED ORDER — HYDRALAZINE HCL 10 MG PO TABS: 10.0000 mg | ORAL_TABLET | Freq: Three times a day (TID) | ORAL | 1 refills | Status: DC

## 2019-01-13 NOTE — Patient Instructions (Signed)
Medication Instructions:  Your physician has recommended you make the following change in your medication:   CHANGE: Hydralazine to 10 mg three times daily.   If you need a refill on your cardiac medications before your next appointment, please call your pharmacy.   Lab work: None.  If you have labs (blood work) drawn today and your tests are completely normal, you will receive your results only by: [name] MyChart Message (if you have MyChart) OR . A paper copy in the mail If you have any lab test that is abnormal or we need to change your treatment, we will call you to review the results.  Testing/Procedures: None.   Follow-Up: At [facility], you and your health needs are our priority.  As part of our continuing mission to provide you with exceptional heart care, we have created designated Provider Care Teams.  These Care Teams include your primary Cardiologist (physician) and Advanced Practice Providers (APPs -  Physician Assistants and Nurse Practitioners) who all work together to provide you with the care you need, when you need it. You will need a follow up appointment in 1 months.  Please call our office 2 months in advance to schedule this appointment.  You may see No primary care provider on file. or another member of our [employer] Provider Team in [place]: Shirlee More, MD . Jyl Heinz, MD  Any Other Special Instructions Will Be Listed Below (If Applicable).

## 2019-01-13 NOTE — Progress Notes (Signed)
Cardiology Office Note:    Date:  01/13/2019   ID:  Molly Norton, DOB 13-Aug-1965, MRN [id number]  PCP:  Maxie Betterousins, Sheronette, MD  Cardiologist:  Gypsy Balsamobert , MD    Referring MD: Maxie Betterousins, Sheronette, MD   Chief Complaint  Patient presents with  . Follow-up  I am still not doing well  History of Present Illness:    Molly Norton is a 53 y.o. female with history of cardiomyopathy, lately improved over now complaining of having exertional shortness of breath as well as palpitations.  I invited her today to my office to talk about this issue.  She did have echocardiogram done which showed ejection fraction 5055%.  However there was a comments during the echocardiogram that it was difficult to assess ejection fraction because of frequent ectopy.  We did EKG today which showed ventricle bigeminy and PVCs are coming from RVOT.  Today she also describes episodes of dizziness that she can be standing for long.  Time then she still getting sweaty swimmy headed and then nauseated she has to sit down otherwise she would pass out.  She also noticed exercise intolerance which is quite complex is to be because she does have a chronic back problem.  Past Medical History:  Diagnosis Date  . Anemia   . Cardiomyopathy (HCC) 06/23/2014  . CHF (congestive heart failure) (HCC)   . Chronic lower back pain   . Constipation   . DDD (degenerative disc disease)    at L4-5 and L5-S1  . Essential hypertension 06/23/2014  . Family history of adverse reaction to anesthesia    my daughter " came out in a rage."  . Family history of breast cancer   . Family history of colon cancer   . Hypertension   . Joint pain   . Obesity (BMI 30-39.9)   . PONV (postoperative nausea and vomiting)   . Swelling of lower leg   . Vitamin D deficiency     Past Surgical History:  Procedure Laterality Date  . ABDOMINAL HYSTERECTOMY    . ANTERIOR LUMBAR FUSION  12/19/2010   L4-5 and L5-S1  . BREAST LUMPECTOMY     . CESAREAN SECTION  1990  . COLONOSCOPY    . LAPAROSCOPIC OVARIAN CYSTECTOMY     post cyst rupture  . LEFT AND RIGHT HEART CATHETERIZATION WITH CORONARY ANGIOGRAM N/A 06/27/2014   Procedure: LEFT AND RIGHT HEART CATHETERIZATION WITH CORONARY ANGIOGRAM;  Surgeon: Corky CraftsJayadeep S Varanasi, MD;  [facility];  Service: Cardiovascular;  Laterality: N/A;  . NASAL SINUS SURGERY    . SACROILIAC JOINT FUSION Right 05/17/2015   Procedure: SACROILIAC JOINT FUSION;  Surgeon: Estill BambergMark Dumonski, MD;  [facility];  Service: Orthopedics;  Laterality: Right;  Right sided sacroiliac joint fusion  . WISDOM TOOTH EXTRACTION      Current Medications: Current Meds  Medication Sig  . baclofen (LIORESAL) 20 MG tablet Take 1 tablet (20 mg total) by mouth 4 (four) times daily.  . calcium citrate-vitamin D (CITRACAL+D) 315-200 MG-UNIT tablet Take 2 tablets by mouth daily.  . cyclobenzaprine (FLEXERIL) 10 MG tablet Take 1 tablet (10 mg total) by mouth at bedtime.  . docusate sodium (COLACE) 100 MG capsule Take 1 capsule (100 mg total) by mouth daily.  . Estradiol (DIVIGEL) 1 MG/GM GEL Place onto the skin.  . fluticasone (FLONASE) 50 MCG/ACT nasal spray Place 2 sprays into both nostrils daily.  . furosemide (LASIX) 40 MG tablet Take 0.5 tablets (20 mg total) by  mouth daily.  . hydrALAZINE (APRESOLINE) 25 MG tablet Take 25 mg by mouth 2 (two) times daily.   [name]. HYDROmorphone (DILAUDID) 4 MG tablet Take 1 tablet by mouth every 6 (six) hours as needed.  . metFORMIN (GLUCOPHAGE) 500 MG tablet TAKE 1 TABLET BY MOUTH EVERY DAY WITH BREAKFAST  . metoprolol succinate (TOPROL-XL) 25 MG 24 hr tablet Take 12.5 mg by mouth daily.  [name]. orlistat (XENICAL) 120 MG capsule Take 1 capsule (120 mg total) by mouth 3 (three) times daily with meals.  [name]. spironolactone (ALDACTONE) 25 MG tablet Take 25 mg by mouth daily.  [name]. tiZANidine (ZANAFLEX) 4 MG tablet Take 4 mg by mouth every 6 (six) hours as needed for muscle spasms.  . traZODone  (DESYREL) 50 MG tablet Take 25 mg by mouth at bedtime.   . Vitamin D, Ergocalciferol, (DRISDOL) 1.25 MG (50000 UT) CAPS capsule Take 1 capsule (50,000 Units total) by mouth every 7 (seven) days.     Allergies:   Adhesive [tape], Latex, Lisinopril, Relafen [nabumetone], Valium [diazepam], Codeine, Other, and Robaxin [methocarbamol]   Social History   Socioeconomic History  . Marital status: Married    Spouse name: Arnes  . Number of children: Not on file  . Years of education: Not on file  . Highest education level: Not on file  Occupational History  . Occupation: stay at home spouse  Social Needs  . Financial resource strain: Not on file  . Food insecurity    Worry: Not on file    Inability: Not on file  . Transportation needs    Medical: Not on file    Non-medical: Not on file  Tobacco Use  . Smoking status: Never Smoker  . Smokeless tobacco: Never Used  Substance and Sexual Activity  . Alcohol use: Yes    Comment: rare  . Drug use: No  . Sexual activity: Not on file  Lifestyle  . Physical activity    Days per week: Not on file    Minutes per session: Not on file  . Stress: Not on file  Relationships  . Social Musicianconnections    Talks on phone: Not on file    Gets together: Not on file    Attends religious service: Not on file    Active member of club or organization: Not on file    Attends meetings of clubs or organizations: Not on file    Relationship status: Not on file  Other Topics Concern  . Not on file  Social History Narrative   Separated, lives alone, marital stress in past.    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 ROS:   Please see the history of present illness.    All 14 point review of systems negative except as described per history of present illness  EKGs/Labs/Other Studies Reviewed:      Recent Labs: 02/22/2018: ALT 13; BUN 8; Creatinine, Ser 0.62; Hemoglobin 12.8; Potassium 4.3; Sodium 138; TSH 0.869  Recent Lipid Panel  Component Value Date/Time   CHOL 218 (H) 02/22/2018 1242   TRIG 47 02/22/2018 1242   HDL 70 02/22/2018 1242   LDLCALC 139 (H) 02/22/2018 1242    Physical Exam:    VS:  BP 140/80   Pulse (!) 52   Ht 5' 7.5" (1.715 m)   Wt 203 lb (92.1 kg)   SpO2 98%   BMI 31.33 kg/m     Wt Readings from Last 3 Encounters:  01/13/19 203 lb (92.1 kg)  07/22/18 194 lb (88 kg)  06/23/18 196 lb (88.9 kg)     GEN:  Well nourished, well developed in no acute distress HEENT: Normal NECK: No JVD; No carotid bruits LYMPHATICS: No lymphadenopathy CARDIAC: RRR, no murmurs, no rubs, no gallops RESPIRATORY:  Clear to auscultation without rales, wheezing or rhonchi  ABDOMEN: Soft, non-tender, non-distended MUSCULOSKELETAL:  No edema; No deformity  SKIN: Warm and dry LOWER EXTREMITIES: no swelling NEUROLOGIC:  Alert and oriented x 3 PSYCHIATRIC:  Normal affect   ASSESSMENT:    1. Dilated cardiomyopathy ([place])   2. Essential hypertension   3. Palpitations    PLAN:    In order of problems listed above:  1. Dilated cardiomyopathy last echocardiogram done just few days ago showed normal ejection fraction. 2. Essential hypertension asked her to stop hydralazine 25 twice daily and start 10 mg 3 times a day 3. Palpitations EKG showed ventricular bigeminy and RVOT ectopy.  I think we may be able to help her with more aggressive approach and she is going to wear a monitor to see how much of this ectopy she has she may be  candidate for ablation or antiarrhythmic therapy.   Medication Adjustments/Labs and Tests Ordered: Current medicines are reviewed at length with the patient today.  Concerns regarding medicines are outlined above.  No orders of the defined types were placed in this encounter.  Medication changes: No orders of the defined types were placed in this encounter.   Signed, Park Liter, MD, [facility] 01/13/2019 11:23 AM    [place]

## 2019-01-16 ENCOUNTER — Ambulatory Visit (INDEPENDENT_AMBULATORY_CARE_PROVIDER_SITE_OTHER): Payer: Federal, State, Local not specified - PPO

## 2019-01-16 DIAGNOSIS — I420 Dilated cardiomyopathy: Secondary | ICD-10-CM

## 2019-01-16 DIAGNOSIS — R002 Palpitations: Secondary | ICD-10-CM

## 2019-01-16 NOTE — Progress Notes (Signed)
Office: [phone]  /  Fax: [phone] TeleHealth Visit:  Molly Norton has verbally consented to this TeleHealth visit today. The patient is located at home, the provider is located at the [facility] office. The participants in this visit include the listed provider and patient. Molly Norton was unable to use realtime audiovisual technology today and the telehealth visit was conducted via telephone.   HPI:   Chief Complaint: OBESITY Molly Norton is here to discuss her progress with her obesity treatment plan. She is on the Category 3 plan and is following her eating plan approximately 20 % of the time. She states she is exercising 0 minutes 0 times per week. Molly Norton started Xenical per her request, and she states her insurance wouldn't cover Xenical for obesity but it would cover for hypertension. She is tolerating it well and Molly Norton also losing weight, but is only eating about 1/2 of the food on her plan. She states heir blood pressure has been 120/90 over the last few days. We were unable to weigh the patient today for this TeleHealth visit. She feels as if she has lost 3 lbs since her last visit. She has lost 13 lbs since starting treatment with Molly Norton.  Insulin Resistance Molly Norton has a diagnosis of insulin resistance based on her elevated fasting insulin level >5. Although Zylee's blood glucose readings are still under good control, insulin resistance puts her at greater risk of metabolic syndrome and diabetes. She is working on diet and weight loss, and she is tolerating metformin well. She denies nausea, vomiting, or hypoglycemia.   Vitamin D Deficiency Molly Norton has a diagnosis of vitamin D deficiency. She is stable on prescription Vit D, but level is not yet at goal. She denies nausea, vomiting or muscle weakness.  ASSESSMENT AND PLAN:  Class 1 obesity with serious comorbidity and body mass index (BMI) of 30.0 to 30.9 in adult, unspecified obesity type  Vitamin D  deficiency - Plan: Vitamin D, Ergocalciferol, (DRISDOL) 1.25 MG (50000 UT) CAPS capsule  Insulin resistance - Plan: metFORMIN (GLUCOPHAGE) 500 MG tablet  PLAN:  Insulin Resistance Molly Norton will continue to work on weight loss, diet, exercise, and decreasing simple carbohydrates in her diet to help decrease the risk of diabetes. We dicussed metformin including benefits and risks. She was informed that eating too many simple carbohydrates or too many calories at one sitting increases the likelihood of GI side effects. Molly Norton agrees to continue taking metformin 500 mg q AM #30 and we will refill for 1 month. Molly Norton agrees to follow up with our clinic in 3 weeks as directed to monitor her progress.  Vitamin D Deficiency Molly Norton was informed that low vitamin D levels contributes to fatigue and are associated with obesity, breast, and colon cancer. Molly Norton agrees to continue taking prescription Vit D 50,000 IU every week #4 and we will refill for 1 month. She will follow up for routine testing of vitamin D, at least 2-3 times per year. She was informed of the risk of over-replacement of vitamin D and agrees to not increase her dose unless she discusses this with Molly Norton first. Molly Norton agrees to follow up with our clinic in 3 weeks.  I spent > than 50% of the 25 minute visit on counseling as documented in the note.  Obesity Molly Norton is currently in the action stage of change. As such, her goal is to continue with weight loss efforts She has agreed to follow the Category 3 plan  Molly Norton was encouraged to eat  at least all of the lean protein on her plan, if she couldn't eat the rest. Asianae has been instructed to work up to a goal of 150 minutes of combined cardio and strengthening exercise per week for weight loss and overall health benefits. We discussed the following Behavioral Modification Strategies today: increasing lean protein intake, increasing vegetables, work on meal planning and easy  cooking plans, and no skipping meals Unita agrees to start baclofen 20 mg QID #120 with no refills.   Cacey has agreed to follow up with our clinic in 3 weeks. She was informed of the importance of frequent follow up visits to maximize her success with intensive lifestyle modifications for her multiple health conditions.  ALLERGIES: Allergies  Allergen Reactions  . Adhesive [Tape]     Steri-strips caused blisters  . Latex Itching and Rash  . Lisinopril     Difficult swallowing  . Relafen [Nabumetone] Swelling  . Valium [Diazepam] Other (See Comments)    Causes hyper activity Has opposite effect ,becomes very agitated  . Codeine Itching  . Other     NO BLOOD OR BLOOD PRODUCTS  . Robaxin [Methocarbamol] Other (See Comments)    Hospital gave it to her she is unsure of reaction    MEDICATIONS: Current Outpatient Medications on File Prior to Visit  Medication Sig Dispense Refill  . calcium citrate-vitamin D (CITRACAL+D) 315-200 MG-UNIT tablet Take 2 tablets by mouth daily.    . cyclobenzaprine (FLEXERIL) 10 MG tablet Take 1 tablet (10 mg total) by mouth at bedtime. 30 tablet 2  . docusate sodium (COLACE) 100 MG capsule Take 1 capsule (100 mg total) by mouth daily. 30 capsule 0  . Estradiol (DIVIGEL) 1 MG/GM GEL Place onto the skin.    . fluticasone (FLONASE) 50 MCG/ACT nasal spray Place 2 sprays into both nostrils daily. 48 g 0  . furosemide (LASIX) 40 MG tablet Take 0.5 tablets (20 mg total) by mouth daily. 37 tablet 0  . HYDROmorphone (DILAUDID) 4 MG tablet Take 1 tablet by mouth every 6 (six) hours as needed.    . metoprolol succinate (TOPROL-XL) 25 MG 24 hr tablet Take 12.5 mg by mouth daily.  6  . orlistat (XENICAL) 120 MG capsule Take 1 capsule (120 mg total) by mouth 3 (three) times daily with meals. 90 capsule 0  . spironolactone (ALDACTONE) 25 MG tablet Take 25 mg by mouth daily.    [name] tiZANidine (ZANAFLEX) 4 MG tablet Take 4 mg by mouth every 6 (six) hours as needed  for muscle spasms.    . traZODone (DESYREL) 50 MG tablet Take 25 mg by mouth at bedtime.     . [DISCONTINUED] DULoxetine (CYMBALTA) 30 MG capsule Take 30 mg by mouth daily.     No current facility-administered medications on file prior to visit.     PAST MEDICAL HISTORY: Past Medical History:  Diagnosis Date  . Anemia   . Cardiomyopathy ([place]) 06/23/2014  . CHF (congestive heart failure) ([place])   . Chronic lower back pain   . Constipation   . DDD (degenerative disc disease)    at L4-5 and L5-S1  . Essential hypertension 06/23/2014  . Family history of adverse reaction to anesthesia    my daughter " came out in a rage."  . Family history of breast cancer   . Family history of colon cancer   . Hypertension   . Joint pain   . Obesity (BMI 30-39.9)   . PONV (postoperative nausea and vomiting)   .  Swelling of lower leg   . Vitamin D deficiency     PAST SURGICAL HISTORY: Past Surgical History:  Procedure Laterality Date  . ABDOMINAL HYSTERECTOMY    . ANTERIOR LUMBAR FUSION  12/19/2010   L4-5 and L5-S1  . BREAST LUMPECTOMY    . CESAREAN SECTION  1990  . COLONOSCOPY    . LAPAROSCOPIC OVARIAN CYSTECTOMY     post cyst rupture  . LEFT AND RIGHT HEART CATHETERIZATION WITH CORONARY ANGIOGRAM N/A 06/27/2014   Procedure: LEFT AND RIGHT HEART CATHETERIZATION WITH CORONARY ANGIOGRAM;  Surgeon: Corky CraftsJayadeep S Varanasi, MD;  [facility];  Service: Cardiovascular;  Laterality: N/A;  . NASAL SINUS SURGERY    . SACROILIAC JOINT FUSION Right 05/17/2015   Procedure: SACROILIAC JOINT FUSION;  Surgeon: Estill BambergMark Dumonski, MD;  [facility];  Service: Orthopedics;  Laterality: Right;  Right sided sacroiliac joint fusion  . WISDOM TOOTH EXTRACTION      SOCIAL HISTORY: Social History   Tobacco Use  . Smoking status: Never Smoker  . Smokeless tobacco: Never Used  Substance Use Topics  . Alcohol use: Yes    Comment: rare  . Drug use: No    FAMILY HISTORY: Family History  Problem Relation  Age of Onset  . Hypertension Mother   . Heart attack Mother 7459  . Obesity Mother   . Heart disease Mother   . Heart disease Father   . High blood pressure Father   . Obesity Father   . Colon cancer Brother 1744  . Breast cancer Maternal Aunt        dx under 45  . Breast cancer Paternal Aunt        dx under 50  . Colon cancer Paternal Uncle        dx over 550  . Colon cancer Paternal Grandmother        dx over 7050  . Breast cancer Maternal Aunt 62  . Colon cancer Paternal Aunt        dx over 2750  . Breast cancer Paternal Aunt        dx over 50    ROS: Review of Systems  Constitutional: Positive for weight loss.  Gastrointestinal: Negative for nausea and vomiting.  Musculoskeletal:       Negative muscle weakness  Endo/Heme/Allergies:       Negative hypoglycemia    PHYSICAL EXAM: Pt in no acute distress  RECENT LABS AND TESTS: BMET    Component Value Date/Time   NA 138 02/22/2018 1242   K 4.3 02/22/2018 1242   CL 99 02/22/2018 1242   CO2 24 02/22/2018 1242   GLUCOSE 81 02/22/2018 1242   GLUCOSE 83 08/23/2016 1223   BUN 8 02/22/2018 1242   CREATININE 0.62 02/22/2018 1242   CALCIUM 8.8 02/22/2018 1242   GFRNONAA 105 02/22/2018 1242   GFRAA 122 02/22/2018 1242   Lab Results  Component Value Date   HGBA1C 5.3 02/22/2018   Lab Results  Component Value Date   INSULIN 6.5 02/22/2018   CBC    Component Value Date/Time   WBC 5.9 02/22/2018 1242   WBC 10.2 08/23/2016 1223   RBC 4.57 02/22/2018 1242   RBC 4.73 08/23/2016 1223   HGB 12.8 02/22/2018 1242   HCT 39.5 02/22/2018 1242   PLT 200 08/23/2016 1223   MCV 86 02/22/2018 1242   MCH 28.0 02/22/2018 1242   MCH 27.5 08/23/2016 1223   MCHC 32.4 02/22/2018 1242   MCHC 32.8 08/23/2016 1223  RDW 13.6 02/22/2018 1242   LYMPHSABS 2.5 02/22/2018 1242   MONOABS 0.4 05/10/2015 1439   EOSABS 0.1 02/22/2018 1242   BASOSABS 0.1 02/22/2018 1242   Iron/TIBC/Ferritin/ %Sat No results found for: IRON, TIBC, FERRITIN,  IRONPCTSAT Lipid Panel     Component Value Date/Time   CHOL 218 (H) 02/22/2018 1242   TRIG 47 02/22/2018 1242   HDL 70 02/22/2018 1242   LDLCALC 139 (H) 02/22/2018 1242   Hepatic Function Panel     Component Value Date/Time   PROT 6.9 02/22/2018 1242   ALBUMIN 4.7 02/22/2018 1242   AST 19 02/22/2018 1242   ALT 13 02/22/2018 1242   ALKPHOS 57 02/22/2018 1242   BILITOT 0.2 02/22/2018 1242      Component Value Date/Time   TSH 0.869 02/22/2018 1242      I, Burt KnackSharon Martin, am acting as transcriptionist for Quillian Quincearen Beasley, MD I have reviewed the above documentation for accuracy and completeness, and I agree with the above. -Quillian Quincearen Beasley, MD

## 2019-02-02 ENCOUNTER — Encounter (INDEPENDENT_AMBULATORY_CARE_PROVIDER_SITE_OTHER): Payer: Self-pay

## 2019-02-02 ENCOUNTER — Telehealth (INDEPENDENT_AMBULATORY_CARE_PROVIDER_SITE_OTHER): Payer: Federal, State, Local not specified - PPO | Admitting: Family Medicine

## 2019-02-16 ENCOUNTER — Other Ambulatory Visit (INDEPENDENT_AMBULATORY_CARE_PROVIDER_SITE_OTHER): Payer: Self-pay | Admitting: Family Medicine

## 2019-02-16 ENCOUNTER — Other Ambulatory Visit: Payer: Self-pay | Admitting: Cardiology

## 2019-02-16 DIAGNOSIS — E8881 Metabolic syndrome: Secondary | ICD-10-CM

## 2019-02-17 ENCOUNTER — Telehealth: Payer: Federal, State, Local not specified - PPO | Admitting: Cardiology

## 2019-02-21 ENCOUNTER — Encounter: Payer: Self-pay | Admitting: Cardiology

## 2019-02-21 ENCOUNTER — Telehealth (INDEPENDENT_AMBULATORY_CARE_PROVIDER_SITE_OTHER): Payer: Federal, State, Local not specified - PPO | Admitting: Cardiology

## 2019-02-21 ENCOUNTER — Other Ambulatory Visit: Payer: Self-pay

## 2019-02-21 VITALS — BP 150/100

## 2019-02-21 DIAGNOSIS — I10 Essential (primary) hypertension: Secondary | ICD-10-CM

## 2019-02-21 DIAGNOSIS — I420 Dilated cardiomyopathy: Secondary | ICD-10-CM

## 2019-02-21 DIAGNOSIS — R002 Palpitations: Secondary | ICD-10-CM

## 2019-02-21 MED ORDER — HYDRALAZINE HCL 25 MG PO TABS: 25.0000 mg | ORAL_TABLET | Freq: Three times a day (TID) | ORAL | 1 refills | Status: DC

## 2019-02-21 NOTE — Progress Notes (Signed)
Virtual Visit via Video Note   This visit type was conducted due to national recommendations for restrictions regarding the COVID-19 Pandemic (e.g. social distancing) in an effort to limit this patient's exposure and mitigate transmission in our community.  Due to her co-morbid illnesses, this patient is at least at moderate risk for complications without adequate follow up.  This format is felt to be most appropriate for this patient at this time.  All issues noted in this document were discussed and addressed.  A limited physical exam was performed with this format.  Please refer to the patient's chart for her consent to telehealth for [facility].  Evaluation Performed:  Follow-up visit  This visit type was conducted due to national recommendations for restrictions regarding the COVID-19 Pandemic (e.g. social distancing).  This format is felt to be most appropriate for this patient at this time.  All issues noted in this document were discussed and addressed.  No physical exam was performed (except for noted visual exam findings with Video Visits).  Please refer to the patient's chart (MyChart message for video visits and phone note for telephone visits) for the patient's consent to telehealth [facility].  Date:  02/21/2019  ID: Molly Norton, DOB July 07, 1965, MRN [id number]   [place] [id number] [place] [id number]   Provider location:   [facility] [place] [facility]  PCP:  Maxie Betterousins, Sheronette, MD  Cardiologist:  Gypsy Balsamobert , MD     Chief Complaint: Still have some palpitations pressure at the evening time  History of Present Illness:    Molly Norton is a 53 y.o. female  who presents via audio/video conferencing for a telehealth visit today.  With history of cardiomyopathy likely normalization also some palpitations.  She contacted us complaining of having some fatigue shortness of breath.  Echocardiogram has been repeated which showed  preserved/normal left ventricle ejection fraction.  She was noted to have some frequent PVCs and she actually will monitor trying to determine the burden of extrasystole.  Overall she complained of having fatigue tiredness.  Her blood pressure still elevated systolic 150 diastolic about 100.  Last time we switch her from hydralazine 25 twice daily to hydralazine 10 mg 3 times a day.  Now it is time to upgrade this medication will go to hydralazine 25 3 times a day.   The patient does not have symptoms concerning for COVID-19 infection (fever, chills, cough, or new SHORTNESS OF BREATH).    Prior CV studies:   The following studies were reviewed today:  .     Past Medical History:  Diagnosis Date  . Anemia   . Cardiomyopathy ([facility]) 06/23/2014  . CHF (congestive heart failure) ([facility])   . Chronic lower back pain   . Constipation   . DDD (degenerative disc disease)    at L4-5 and L5-S1  . Essential hypertension 06/23/2014  . Family history of adverse reaction to anesthesia    my daughter " came out in a rage."  . Family history of breast cancer   . Family history of colon cancer   . Hypertension   . Joint pain   . Obesity (BMI 30-39.9)   . PONV (postoperative nausea and vomiting)   . Swelling of lower leg   . Vitamin D deficiency     Past Surgical History:  Procedure Laterality Date  . ABDOMINAL HYSTERECTOMY    . ANTERIOR LUMBAR FUSION  12/19/2010   L4-5 and L5-S1  . BREAST LUMPECTOMY    .  CESAREAN SECTION  1990  . COLONOSCOPY    . LAPAROSCOPIC OVARIAN CYSTECTOMY     post cyst rupture  . LEFT AND RIGHT HEART CATHETERIZATION WITH CORONARY ANGIOGRAM N/A 06/27/2014   Procedure: LEFT AND RIGHT HEART CATHETERIZATION WITH CORONARY ANGIOGRAM;  Surgeon: Jettie Booze, MD;  [facility] C[facility] LAB;  Service: Cardiovascular;  Laterality: N/A;  . NASAL SINUS SURGERY    . SACROILIAC JOINT FUSION Right 05/17/2015   Procedure: SACROILIAC JOINT FUSION;  Surgeon: Phylliss Bob, MD;   Location: [place];  Service: Orthopedics;  Laterality: Right;  Right sided sacroiliac joint fusion  . WISDOM TOOTH EXTRACTION       Current Meds  Medication Sig  . baclofen (LIORESAL) 20 MG tablet Take 1 tablet (20 mg total) by mouth 4 (four) times daily.  . calcium citrate-vitamin D (CITRACAL+D) 315-200 MG-UNIT tablet Take 2 tablets by mouth daily.  . cyclobenzaprine (FLEXERIL) 10 MG tablet Take 1 tablet (10 mg total) by mouth at bedtime.  . docusate sodium (COLACE) 100 MG capsule Take 1 capsule (100 mg total) by mouth daily.  . Estradiol (DIVIGEL) 1 MG/GM GEL Place onto the skin.  . fluticasone (FLONASE) 50 MCG/ACT nasal spray Place 2 sprays into both nostrils daily.  . furosemide (LASIX) 40 MG tablet TAKE 1/2 TABLET BY MOUTH DAILY  . hydrALAZINE (APRESOLINE) 10 MG tablet Take 1 tablet (10 mg total) by mouth 3 (three) times daily.  [name] HYDROmorphone (DILAUDID) 4 MG tablet Take 1 tablet by mouth every 6 (six) hours as needed.  . metFORMIN (GLUCOPHAGE) 500 MG tablet TAKE 1 TABLET BY MOUTH EVERY DAY WITH BREAKFAST  . metoprolol succinate (TOPROL-XL) 25 MG 24 hr tablet Take 12.5 mg by mouth daily.  [name] orlistat (XENICAL) 120 MG capsule Take 1 capsule (120 mg total) by mouth 3 (three) times daily with meals.  [name] spironolactone (ALDACTONE) 25 MG tablet Take 25 mg by mouth daily.  [name] tiZANidine (ZANAFLEX) 4 MG tablet Take 4 mg by mouth every 6 (six) hours as needed for muscle spasms.  . traZODone (DESYREL) 50 MG tablet Take 25 mg by mouth at bedtime.   . Vitamin D, Ergocalciferol, (DRISDOL) 1.25 MG (50000 UT) CAPS capsule Take 1 capsule (50,000 Units total) by mouth every 7 (seven) days.      Family History: The patient's family history includes Breast cancer in her maternal aunt, paternal aunt, and paternal aunt; Breast cancer (age of onset: 56) in her maternal aunt; Colon cancer in her paternal aunt, paternal grandmother, and paternal uncle; Colon cancer (age of onset: 64) in her brother; Heart  attack (age of onset: 58) in her mother; Heart disease in her father and mother; High blood pressure in her father; Hypertension in her mother; Obesity in her father and mother.   ROS:   Please see the history of present illness.     All other systems reviewed and are negative.   Labs/Other Tests and Data Reviewed:     Recent Labs: 02/22/2018: ALT 13; BUN 8; Creatinine, Ser 0.62; Hemoglobin 12.8; Potassium 4.3; Sodium 138; TSH 0.869  Recent Lipid Panel    Component Value Date/Time   CHOL 218 (H) 02/22/2018 1242   TRIG 47 02/22/2018 1242   HDL 70 02/22/2018 1242   LDLCALC 139 (H) 02/22/2018 1242      Exam:    Vital Signs:  BP (!) 150/100     Wt Readings from Last 3 Encounters:  01/13/19 203 lb (92.1 kg)  07/22/18 194 lb (88 kg)  06/23/18 196 lb (88.9 kg)     Well nourished, well developed in no acute distress. Alert awake and attentive with talking to me from her house and in [place].  Overall doing well.  Described to have palpitations at evening time.  But not in any distress at the time of my conversation with her.  Diagnosis for this visit:   1. Essential hypertension   2. Dilated cardiomyopathy (HCC)   3. Palpitations      ASSESSMENT & PLAN:    1.  Essential hypertension we will increase hydralazine from 10 mg 3 times a day to 25 3 times a day.  She will keep checking her blood pressure. History of cardiomyopathy.  Last echocardiogram showed normal left ventricular ejection fraction. 3.  Palpitations.  Again better with some awaiting results of this event recorder she sent it back at the beginning of last week therefore, anticipate she will get it fairly soon.  COVID-19 Education: The signs and symptoms of COVID-19 were discussed with the patient and how to seek care for testing (follow up with PCP or arrange E-visit).  The importance of social distancing was discussed today.  Patient Risk:   After full review of this patients clinical status, I feel that  they are at least moderate risk at this time.  Time:   Today, I have spent 15 minutes with the patient with telehealth technology discussing pt health issues.  I spent 5 minutes reviewing her chart before the visit.  Visit was finished at 1106.    Medication Adjustments/Labs and Tests Ordered: Current medicines are reviewed at length with the patient today.  Concerns regarding medicines are outlined above.  No orders of the defined types were placed in this encounter.  Medication changes: No orders of the defined types were placed in this encounter.    Disposition: Follow-up in 1 month  Signed, Georgeanna Lea, MD, [facility] 02/21/2019 11:04 AM    [facility]

## 2019-02-21 NOTE — Addendum Note (Signed)
Addended by: Particia Nearing B on: 02/21/2019 11:37 AM   Modules accepted: Orders

## 2019-02-21 NOTE — Patient Instructions (Signed)
Medication Instructions:  Your physician has recommended you make the following change in your medication:  INCREASE: Hydralazine 25 mg Take 1 tab three times daily  If you need a refill on your cardiac medications before your next appointment, please call your pharmacy.   Lab work: Economist If you have labs (blood work) drawn today and your tests are completely normal, you will receive your results only by: [name] MyChart Message (if you have MyChart) OR . A paper copy in the mail If you have any lab test that is abnormal or we need to change your treatment, we will call you to review the results.  Testing/Procedures: NOne  Follow-Up: At [facility], you and your health needs are our priority.  As part of our continuing mission to provide you with exceptional heart care, we have created designated Provider Care Teams.  These Care Teams include your primary Cardiologist (physician) and Advanced Practice Providers (APPs -  Physician Assistants and Nurse Practitioners) who all work together to provide you with the care you need, when you need it. You will need a follow up appointment in 1 months with Dr Agustin Cree. Any Other Special Instructions Will Be Listed Below (If Applicable).

## 2019-02-28 ENCOUNTER — Telehealth: Payer: Self-pay | Admitting: Cardiology

## 2019-02-28 NOTE — Telephone Encounter (Signed)
Wants monitor results °

## 2019-02-28 NOTE — Telephone Encounter (Signed)
Left message informing her we do not have results yet but will call her when we do.

## 2019-03-23 ENCOUNTER — Other Ambulatory Visit: Payer: Self-pay

## 2019-03-23 ENCOUNTER — Telehealth (INDEPENDENT_AMBULATORY_CARE_PROVIDER_SITE_OTHER): Payer: Federal, State, Local not specified - PPO | Admitting: Cardiology

## 2019-03-23 ENCOUNTER — Encounter: Payer: Self-pay | Admitting: Cardiology

## 2019-03-23 VITALS — BP 150/100 | Wt 211.0 lb

## 2019-03-23 DIAGNOSIS — I420 Dilated cardiomyopathy: Secondary | ICD-10-CM | POA: Diagnosis not present

## 2019-03-23 DIAGNOSIS — I4729 Other ventricular tachycardia: Secondary | ICD-10-CM | POA: Insufficient documentation

## 2019-03-23 DIAGNOSIS — I10 Essential (primary) hypertension: Secondary | ICD-10-CM

## 2019-03-23 DIAGNOSIS — I472 Ventricular tachycardia: Secondary | ICD-10-CM | POA: Insufficient documentation

## 2019-03-23 MED ORDER — METOPROLOL SUCCINATE ER 25 MG PO TB24: 25.0000 mg | ORAL_TABLET | Freq: Every day | ORAL | 1 refills | Status: AC

## 2019-03-23 NOTE — Progress Notes (Signed)
Virtual Visit via Telephone Note   This visit type was conducted due to national recommendations for restrictions regarding the COVID-19 Pandemic (e.g. social distancing) in an effort to limit this patient's exposure and mitigate transmission in our community.  Due to her co-morbid illnesses, this patient is at least at moderate risk for complications without adequate follow up.  This format is felt to be most appropriate for this patient at this time.  The patient did not have access to video technology/had technical difficulties with video requiring transitioning to audio format only (telephone).  All issues noted in this document were discussed and addressed.  No physical exam could be performed with this format.  Please refer to the patient's chart for her  consent to telehealth for [facility].  Evaluation Performed:  Follow-up visit  This visit type was conducted due to national recommendations for restrictions regarding the COVID-19 Pandemic (e.g. social distancing).  This format is felt to be most appropriate for this patient at this time.  All issues noted in this document were discussed and addressed.  No physical exam was performed (except for noted visual exam findings with Video Visits).  Please refer to the patient's chart (MyChart message for video visits and phone note for telephone visits) for the patient's consent to telehealth for [facility]  Date:  03/23/2019  ID: Molly Norton, DOB 1965-08-22, MRN [id number]   Patient Location: Molly [place] [id number]   Provider location:   [place] Office  PCP:  Servando Salina, MD  Cardiologist:  Jenne Campus, MD     Chief Complaint: Not feeling well today  History of Present Illness:    Molly Norton is a 53 y.o. female  who presents via audio/video conferencing for a telehealth visit today.  With history of cardiomyopathy however latest echocardiogram from August showed  ejection fraction 50 to 55%.  She also complained of having palpitations.  She wear monitor which showed 10.3% burden of PVCs with bigeminal as well as trigeminal cycling.  There are also few runs of narrow complex tachycardia as well as few runs of wide-complex tachycardia.  The purpose of this conversation is to create a plan on how to investigate this issue I think we must check to make sure she does not have any significant coronary artery disease, therefore.  She will be scheduled to have a stress test.  I also asked with increased dose of metoprolol succinate that she takes she will take 25 mg daily.  Another reason for doing stress test is to keep options open in terms of adding some antiarrhythmic medications.   The patient does not have symptoms concerning for COVID-19 infection (fever, chills, cough, or new SHORTNESS OF BREATH).    Prior CV studies:   The following studies were reviewed today:  Event recorder showing 10.3% burden of PVCs with bigeminal as well as regular geminal cycling.  Also runs of nonsustained ventricular tachycardia.     Past Medical History:  Diagnosis Date  . Anemia   . Cardiomyopathy ([place]) 06/23/2014  . CHF (congestive heart failure) ([place])   . Chronic lower back pain   . Constipation   . DDD (degenerative disc disease)    at L4-5 and L5-S1  . Essential hypertension 06/23/2014  . Family history of adverse reaction to anesthesia    my daughter " came out in a rage."  . Family history of breast cancer   . Family history of colon cancer   .  Hypertension   . Joint pain   . Obesity (BMI 30-39.9)   . PONV (postoperative nausea and vomiting)   . Swelling of lower leg   . Vitamin D deficiency     Past Surgical History:  Procedure Laterality Date  . ABDOMINAL HYSTERECTOMY    . ANTERIOR LUMBAR FUSION  12/19/2010   L4-5 and L5-S1  . BREAST LUMPECTOMY    . CESAREAN SECTION  1990  . COLONOSCOPY    . LAPAROSCOPIC OVARIAN CYSTECTOMY     post cyst rupture   . LEFT AND RIGHT HEART CATHETERIZATION WITH CORONARY ANGIOGRAM N/A 06/27/2014   Procedure: LEFT AND RIGHT HEART CATHETERIZATION WITH CORONARY ANGIOGRAM;  Surgeon: Corky Crafts, MD;  Location: [facility] CATH LAB;  Service: Cardiovascular;  Laterality: N/A;  . NASAL SINUS SURGERY    . SACROILIAC JOINT FUSION Right 05/17/2015   Procedure: SACROILIAC JOINT FUSION;  Surgeon: Estill Bamberg, MD;  [facility] OR;  Service: Orthopedics;  Laterality: Right;  Right sided sacroiliac joint fusion  . WISDOM TOOTH EXTRACTION       Current Meds  Medication Sig  . baclofen (LIORESAL) 20 MG tablet Take 1 tablet (20 mg total) by mouth 4 (four) times daily.  . calcium citrate-vitamin D (CITRACAL+D) 315-200 MG-UNIT tablet Take 2 tablets by mouth daily.  . cyclobenzaprine (FLEXERIL) 10 MG tablet Take 1 tablet (10 mg total) by mouth at bedtime.  . docusate sodium (COLACE) 100 MG capsule Take 1 capsule (100 mg total) by mouth daily.  . Estradiol (DIVIGEL) 1 MG/GM GEL Place onto the skin.  . fluticasone (FLONASE) 50 MCG/ACT nasal spray Place 2 sprays into both nostrils daily.  . furosemide (LASIX) 40 MG tablet TAKE 1/2 TABLET BY MOUTH DAILY  . hydrALAZINE (APRESOLINE) 10 MG tablet Take 1 tablet by mouth 3 (three) times daily.  [name] HYDROmorphone (DILAUDID) 4 MG tablet Take 1 tablet by mouth every 6 (six) hours as needed.  . metFORMIN (GLUCOPHAGE) 500 MG tablet TAKE 1 TABLET BY MOUTH EVERY DAY WITH BREAKFAST  . metoprolol succinate (TOPROL-XL) 25 MG 24 hr tablet Take 12.5 mg by mouth daily.  [name] spironolactone (ALDACTONE) 25 MG tablet Take 25 mg by mouth daily.  [name] tiZANidine (ZANAFLEX) 4 MG tablet Take 4 mg by mouth every 6 (six) hours as needed for muscle spasms.  . traZODone (DESYREL) 50 MG tablet Take 25 mg by mouth at bedtime.   . Vitamin D, Ergocalciferol, (DRISDOL) 1.25 MG (50000 UT) CAPS capsule Take 1 capsule (50,000 Units total) by mouth every 7 (seven) days.      Family History: The patient's family history  includes Breast cancer in her maternal aunt, paternal aunt, and paternal aunt; Breast cancer (age of onset: 65) in her maternal aunt; Colon cancer in her paternal aunt, paternal grandmother, and paternal uncle; Colon cancer (age of onset: 78) in her brother; Heart attack (age of onset: 72) in her mother; Heart disease in her father and mother; High blood pressure in her father; Hypertension in her mother; Obesity in her father and mother.   ROS:   Please see the history of present illness.     All other systems reviewed and are negative.   Labs/Other Tests and Data Reviewed:     Recent Labs: No results found for requested labs within last 8760 hours.  Recent Lipid Panel    Component Value Date/Time   CHOL 218 (H) 02/22/2018 1242   TRIG 47 02/22/2018 1242   HDL 70 02/22/2018 1242   LDLCALC 139 (  H) 02/22/2018 1242      Exam:    Vital Signs:  BP (!) 150/100   Wt 211 lb (95.7 kg)   BMI 32.56 kg/m     Wt Readings from Last 3 Encounters:  03/23/19 211 lb (95.7 kg)  01/13/19 203 lb (92.1 kg)  07/22/18 194 lb (88 kg)     Well nourished, well developed in no acute distress. Alert awake and attentive 3 feeling weak and tired today with some palpitations but no dizziness no passing out no chest pain  Diagnosis for this visit:   1. Dilated cardiomyopathy (HCC)   2. Nonsustained ventricular tachycardia (HCC)   3. Essential hypertension      ASSESSMENT & PLAN:    1.  Cardiomyopathy last ejection fraction 5055%, will continue present management. Frequent ventricular ectopy with nonsustained ventricular tachycardia.  We will schedule her to have stress test if stress test is negative I will continue monitoring.  As she may be candidate for more aggressive antiarrhythmic therapy with flecainide.  In the meantime I will simply increase dose of beta-blocker. 3.  Essential hypertension blood pressure well controlled.  COVID-19 Education: The signs and symptoms of COVID-19 were  discussed with the patient and how to seek care for testing (follow up with PCP or arrange E-visit).  The importance of social distancing was discussed today.  Patient Risk:   After full review of this patients clinical status, I feel that they are at least moderate risk at this time.  Time:   Today, I have spent 20 minutes with the patient with telehealth technology discussing pt health issues.  I spent 5 minutes reviewing her chart before the visit.  Visit was finished at 227.    Medication Adjustments/Labs and Tests Ordered: Current medicines are reviewed at length with the patient today.  Concerns regarding medicines are outlined above.  No orders of the defined types were placed in this encounter.  Medication changes: No orders of the defined types were placed in this encounter.    Disposition: Follow-up in 1 month  Signed, Georgeanna Leaobert J. , MD, [facility] 03/23/2019 2:25 PM    [facility]

## 2019-03-23 NOTE — Addendum Note (Signed)
Addended by: Ashok Norris on: 03/23/2019 02:49 PM   Modules accepted: Orders

## 2019-03-23 NOTE — Patient Instructions (Addendum)
Medication Instructions:  Your physician has recommended you make the following change in your medication:   Increase: Metoprolol succinate to 25 mg daily   If you need a refill on your cardiac medications before your next appointment, please call your pharmacy.   Lab work: None.  If you have labs (blood work) drawn today and your tests are completely normal, you will receive your results only by: [name]. MyChart Message (if you have MyChart) OR . A paper copy in the mail If you have any lab test that is abnormal or we need to change your treatment, we will call you to review the results.  Testing/Procedures: Your physician has requested that you have a lexiscan myoview. For further information please visit [email or web address]. Please follow instruction sheet, as given.     [facility] [address], [address] [place], [place] [address] Phone: [phone]  Molly Norton    Molly Norton DOB: 01-06-1966 MRN: [id number] [id number] [place] [id number]   Dear Ms. Estell HarpinCuthbert,  You are scheduled for a Myocardial Perfusion Imaging Study on:  10/20/Norton at 745 am .  Please arrive 15 minutes prior to your appointment time for registration and insurance purposes.  The test will take approximately 3 to 4 hours to complete; you may bring reading material.  If someone comes with you to your appointment, they will need to remain in the main lobby due to limited space in the testing area. **If you are pregnant or breastfeeding, please notify the nuclear lab prior to your appointment**  How to prepare for your Myocardial Perfusion Test: . Do not eat or drink 3 hours prior to your test, except you may have water. . Do not consume products containing caffeine (regular or decaffeinated) 12 hours prior to your test. (ex: coffee, chocolate, sodas, tea). Do bring a list of your current medications with you.  If not listed below, you may take  your medications as normal. . Do not take metoprolol (Lopressor, Toprol) for 24 hours prior to the test.  Bring the medication to your appointment as you may be required to take it once the test is complete. [name]. HOLD Metformin, Lasix, and spironolactone the day of the test.  . Do wear comfortable clothes (no dresses or overalls) and walking shoes, tennis shoes preferred (No heels or open toe shoes are allowed). . Do NOT wear cologne, perfume, aftershave, or lotions (deodorant is allowed). . If these instructions are not followed, your test will have to be rescheduled.  Please report to [address], [address] for your test.  If you have questions or concerns about your appointment, you can call the Nuclear Lab at [phone].  If you cannot keep your appointment, please provide 24 hours notification to the Nuclear Lab, to avoid a possible $50 charge to your account.   Follow-Up: At [facility], you and your health needs are our priority.  As part of our continuing mission to provide you with exceptional heart care, we have created designated Provider Care Teams.  These Care Teams include your primary Cardiologist (physician) and Advanced Practice Providers (APPs -  Physician Assistants and Nurse Practitioners) who all work together to provide you with the care you need, when you need it. You will need a follow up appointment in 1 months.  Please call our office 2 months in advance to schedule this appointment.  You may see No primary care provider on file. or another member of  our [employer] Provider Team in [place]: Shirlee More, MD . Jyl Heinz, MD  Any Other Special Instructions Will Be Listed Below (If Applicable).   Cardiac Nuclear Scan A cardiac nuclear scan is a test that measures blood flow to the heart when a person is resting and when he or she is exercising. The test looks for problems such as:  Not enough blood reaching a portion of the heart.  The heart  muscle not working normally. You may need this test if:  You have heart disease.  You have had abnormal lab results.  You have had heart surgery or a balloon procedure to open up blocked arteries (angioplasty).  You have chest pain.  You have shortness of breath. In this test, a radioactive dye (tracer) is injected into your bloodstream. After the tracer has traveled to your heart, an imaging device is used to measure how much of the tracer is absorbed by or distributed to various areas of your heart. This procedure is usually done at a hospital and takes 2-4 hours. Tell a health care provider about:  Any allergies you have.  All medicines you are taking, including vitamins, herbs, eye drops, creams, and over-the-counter medicines.  Any problems you or family members have had with anesthetic medicines.  Any blood disorders you have.  Any surgeries you have had.  Any medical conditions you have.  Whether you are pregnant or may be pregnant. What are the risks? Generally, this is a safe procedure. However, problems may occur, including:  Serious chest pain and heart attack. This is only a risk if the stress portion of the test is done.  Rapid heartbeat.  Sensation of warmth in your chest. This usually passes quickly.  Allergic reaction to the tracer. What happens before the procedure?  Ask your health care provider about changing or stopping your regular medicines. This is especially important if you are taking diabetes medicines or blood thinners.  Follow instructions from your health care provider about eating or drinking restrictions.  Remove your jewelry on the day of the procedure. What happens during the procedure?  An IV will be inserted into one of your veins.  Your health care provider will inject a small amount of radioactive tracer through the IV.  You will wait for 20-40 minutes while the tracer travels through your bloodstream.  Your heart activity will  be monitored with an electrocardiogram (ECG).  You will lie down on an exam table.  Images of your heart will be taken for about 15-20 minutes.  You may also have a stress test. For this test, one of the following may be done: ? You will exercise on a treadmill or stationary bike. While you exercise, your heart's activity will be monitored with an ECG, and your blood pressure will be checked. ? You will be given medicines that will increase blood flow to parts of your heart. This is done if you are unable to exercise.  When blood flow to your heart has peaked, a tracer will again be injected through the IV.  After 20-40 minutes, you will get back on the exam table and have more images taken of your heart.  Depending on the type of tracer used, scans may need to be repeated 3-4 hours later.  Your IV line will be removed when the procedure is over. The procedure may vary among health care providers and hospitals. What happens after the procedure?  Unless your health care provider tells you otherwise, you  may return to your normal schedule, including diet, activities, and medicines.  Unless your health care provider tells you otherwise, you may increase your fluid intake. This will help to flush the contrast dye from your body. Drink enough fluid to keep your urine pale yellow.  Ask your health care provider, or the department that is doing the test: ? When will my results be ready? ? How will I get my results? Summary  A cardiac nuclear scan measures the blood flow to the heart when a person is resting and when he or she is exercising.  Tell your health care provider if you are pregnant.  Before the procedure, ask your health care provider about changing or stopping your regular medicines. This is especially important if you are taking diabetes medicines or blood thinners.  After the procedure, unless your health care provider tells you otherwise, increase your fluid intake. This  will help flush the contrast dye from your body.  After the procedure, unless your health care provider tells you otherwise, you may return to your normal schedule, including diet, activities, and medicines. This information is not intended to replace advice given to you by your health care provider. Make sure you discuss any questions you have with your health care provider. Document Released: 06/20/2004 Document Revised: 11/09/2017 Document Reviewed: 11/09/2017 Elsevier Patient Education  [place] [employer].

## 2019-03-23 NOTE — Progress Notes (Signed)
Left message for patient to return call to go over DC with her.

## 2019-03-24 ENCOUNTER — Telehealth (HOSPITAL_COMMUNITY): Payer: Self-pay

## 2019-03-24 ENCOUNTER — Telehealth: Payer: Federal, State, Local not specified - PPO | Admitting: Cardiology

## 2019-03-24 NOTE — Telephone Encounter (Signed)
Instructions left on the patient's answering machine. Asked to call back with any questions. S.Tariana Moldovan EMTP 

## 2019-03-28 ENCOUNTER — Telehealth: Payer: Self-pay | Admitting: Cardiology

## 2019-03-28 NOTE — Telephone Encounter (Signed)
Patient called returning your call, Please call patient to discuss discharge and testing

## 2019-03-28 NOTE — Telephone Encounter (Signed)
Called patient informed her of discharge instructions from last appointment with Dr. Agustin Cree and mailed her a copy of the paperwork.

## 2019-03-29 ENCOUNTER — Encounter (HOSPITAL_COMMUNITY): Payer: Federal, State, Local not specified - PPO

## 2019-03-31 ENCOUNTER — Encounter (HOSPITAL_COMMUNITY): Payer: Self-pay | Admitting: Cardiology

## 2019-04-08 ENCOUNTER — Telehealth (HOSPITAL_COMMUNITY): Payer: Self-pay

## 2019-04-08 NOTE — Telephone Encounter (Signed)
New message    Just an FYI. We have made several attempts to contact this patient including sending a letter to schedule or reschedule their echocardiogram. We will be removing the patient from the echo WQ.   10.29.20 @ 12:16pm call home phone just rings - Molly Norton   10.22.20 mail reminder letter Molly Norton   10.19.20 Cancel Rsn: Patient

## 2019-04-13 ENCOUNTER — Encounter (HOSPITAL_COMMUNITY): Payer: Self-pay | Admitting: Cardiology

## 2019-04-27 ENCOUNTER — Ambulatory Visit: Payer: Federal, State, Local not specified - PPO | Admitting: Cardiology

## 2019-04-28 ENCOUNTER — Ambulatory Visit: Payer: Federal, State, Local not specified - PPO | Admitting: Cardiology

## 2019-05-09 ENCOUNTER — Ambulatory Visit: Payer: Federal, State, Local not specified - PPO | Admitting: Cardiology

## 2022-01-15 ENCOUNTER — Encounter (INDEPENDENT_AMBULATORY_CARE_PROVIDER_SITE_OTHER): Payer: Self-pay
